# Patient Record
Sex: Female | Born: 1995 | Race: White | Hispanic: No | Marital: Married | Smoking: Former smoker
Health system: Southern US, Community
[De-identification: ages and names within clinical notes are randomized; demographics above are authoritative.]

## PROBLEM LIST (undated history)

## (undated) ENCOUNTER — Inpatient Hospital Stay (HOSPITAL_COMMUNITY): Payer: Self-pay

## (undated) DIAGNOSIS — I10 Essential (primary) hypertension: Secondary | ICD-10-CM

## (undated) DIAGNOSIS — M549 Dorsalgia, unspecified: Secondary | ICD-10-CM

## (undated) DIAGNOSIS — F4312 Post-traumatic stress disorder, chronic: Secondary | ICD-10-CM

## (undated) DIAGNOSIS — Z915 Personal history of self-harm: Secondary | ICD-10-CM

## (undated) DIAGNOSIS — N390 Urinary tract infection, site not specified: Secondary | ICD-10-CM

## (undated) DIAGNOSIS — B9689 Other specified bacterial agents as the cause of diseases classified elsewhere: Secondary | ICD-10-CM

## (undated) DIAGNOSIS — A4902 Methicillin resistant Staphylococcus aureus infection, unspecified site: Secondary | ICD-10-CM

## (undated) DIAGNOSIS — Z973 Presence of spectacles and contact lenses: Secondary | ICD-10-CM

## (undated) DIAGNOSIS — Z8679 Personal history of other diseases of the circulatory system: Secondary | ICD-10-CM

## (undated) DIAGNOSIS — J45909 Unspecified asthma, uncomplicated: Secondary | ICD-10-CM

## (undated) DIAGNOSIS — F419 Anxiety disorder, unspecified: Secondary | ICD-10-CM

## (undated) DIAGNOSIS — IMO0002 Reserved for concepts with insufficient information to code with codable children: Secondary | ICD-10-CM

## (undated) DIAGNOSIS — D649 Anemia, unspecified: Secondary | ICD-10-CM

## (undated) DIAGNOSIS — N760 Acute vaginitis: Secondary | ICD-10-CM

## (undated) DIAGNOSIS — G43909 Migraine, unspecified, not intractable, without status migrainosus: Secondary | ICD-10-CM

## (undated) DIAGNOSIS — F410 Panic disorder [episodic paroxysmal anxiety] without agoraphobia: Secondary | ICD-10-CM

## (undated) DIAGNOSIS — F32A Depression, unspecified: Secondary | ICD-10-CM

## (undated) DIAGNOSIS — F329 Major depressive disorder, single episode, unspecified: Secondary | ICD-10-CM

## (undated) HISTORY — PX: WISDOM TOOTH EXTRACTION: SHX21

## (undated) HISTORY — DX: Essential (primary) hypertension: I10

## (undated) HISTORY — PX: TONSILLECTOMY: SUR1361

---
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
## 2016-01-03 ENCOUNTER — Inpatient Hospital Stay (HOSPITAL_COMMUNITY)
Admission: EM | Admit: 2016-01-03 | Discharge: 2016-01-06 | DRG: 885 | Disposition: A | Discharge status: Home / Self Care | Payer: Medicaid Other | Source: Intra-hospital | Attending: Psychiatry | Admitting: Psychiatry

## 2016-01-03 ENCOUNTER — Emergency Department (HOSPITAL_COMMUNITY)
Admission: EM | Admit: 2016-01-03 | Discharge: 2016-01-03 | Disposition: A | Discharge status: Other Acute Inpatient Hospital | Payer: Medicaid Other | Attending: Emergency Medicine | Admitting: Emergency Medicine

## 2016-01-03 ENCOUNTER — Encounter (HOSPITAL_COMMUNITY): Payer: Self-pay

## 2016-01-03 ENCOUNTER — Emergency Department (HOSPITAL_COMMUNITY): Payer: Medicaid Other

## 2016-01-03 DIAGNOSIS — Y929 Unspecified place or not applicable: Secondary | ICD-10-CM | POA: Insufficient documentation

## 2016-01-03 DIAGNOSIS — G44229 Chronic tension-type headache, not intractable: Secondary | ICD-10-CM | POA: Diagnosis not present

## 2016-01-03 DIAGNOSIS — I10 Essential (primary) hypertension: Secondary | ICD-10-CM | POA: Insufficient documentation

## 2016-01-03 DIAGNOSIS — R52 Pain, unspecified: Secondary | ICD-10-CM

## 2016-01-03 DIAGNOSIS — R45851 Suicidal ideations: Secondary | ICD-10-CM | POA: Diagnosis present

## 2016-01-03 DIAGNOSIS — R519 Headache, unspecified: Secondary | ICD-10-CM | POA: Insufficient documentation

## 2016-01-03 DIAGNOSIS — S1093XA Contusion of unspecified part of neck, initial encounter: Secondary | ICD-10-CM | POA: Insufficient documentation

## 2016-01-03 DIAGNOSIS — F329 Major depressive disorder, single episode, unspecified: Secondary | ICD-10-CM | POA: Diagnosis not present

## 2016-01-03 DIAGNOSIS — Y939 Activity, unspecified: Secondary | ICD-10-CM | POA: Insufficient documentation

## 2016-01-03 DIAGNOSIS — G4700 Insomnia, unspecified: Secondary | ICD-10-CM | POA: Diagnosis present

## 2016-01-03 DIAGNOSIS — M25522 Pain in left elbow: Secondary | ICD-10-CM | POA: Insufficient documentation

## 2016-01-03 DIAGNOSIS — F339 Major depressive disorder, recurrent, unspecified: Secondary | ICD-10-CM | POA: Diagnosis present

## 2016-01-03 DIAGNOSIS — F332 Major depressive disorder, recurrent severe without psychotic features: Secondary | ICD-10-CM | POA: Diagnosis not present

## 2016-01-03 DIAGNOSIS — S161XXA Strain of muscle, fascia and tendon at neck level, initial encounter: Secondary | ICD-10-CM | POA: Diagnosis not present

## 2016-01-03 DIAGNOSIS — F32A Depression, unspecified: Secondary | ICD-10-CM

## 2016-01-03 DIAGNOSIS — M542 Cervicalgia: Secondary | ICD-10-CM | POA: Diagnosis present

## 2016-01-03 DIAGNOSIS — T148 Other injury of unspecified body region: Secondary | ICD-10-CM | POA: Insufficient documentation

## 2016-01-03 DIAGNOSIS — R51 Headache: Secondary | ICD-10-CM | POA: Diagnosis present

## 2016-01-03 DIAGNOSIS — Y999 Unspecified external cause status: Secondary | ICD-10-CM | POA: Diagnosis not present

## 2016-01-03 DIAGNOSIS — T148XXA Other injury of unspecified body region, initial encounter: Secondary | ICD-10-CM

## 2016-01-03 LAB — RAPID URINE DRUG SCREEN, HOSP PERFORMED
Amphetamines: NOT DETECTED
BARBITURATES: NOT DETECTED
Benzodiazepines: NOT DETECTED
Cocaine: NOT DETECTED
OPIATES: NOT DETECTED
TETRAHYDROCANNABINOL: POSITIVE — AB

## 2016-01-03 LAB — COMPREHENSIVE METABOLIC PANEL
ALK PHOS: 55 U/L (ref 38–126)
ALT: 37 U/L (ref 14–54)
ANION GAP: 10 (ref 5–15)
AST: 33 U/L (ref 15–41)
Albumin: 4.3 g/dL (ref 3.5–5.0)
BILIRUBIN TOTAL: 0.5 mg/dL (ref 0.3–1.2)
BUN: 8 mg/dL (ref 6–20)
CALCIUM: 8.9 mg/dL (ref 8.9–10.3)
CO2: 22 mmol/L (ref 22–32)
CREATININE: 0.67 mg/dL (ref 0.44–1.00)
Chloride: 111 mmol/L (ref 101–111)
Glucose, Bld: 94 mg/dL (ref 65–99)
Potassium: 3.5 mmol/L (ref 3.5–5.1)
SODIUM: 143 mmol/L (ref 135–145)
TOTAL PROTEIN: 7.5 g/dL (ref 6.5–8.1)

## 2016-01-03 LAB — CBC
HCT: 41.4 % (ref 36.0–46.0)
HEMOGLOBIN: 13.7 g/dL (ref 12.0–15.0)
MCH: 27.5 pg (ref 26.0–34.0)
MCHC: 33.1 g/dL (ref 30.0–36.0)
MCV: 83 fL (ref 78.0–100.0)
Platelets: 259 10*3/uL (ref 150–400)
RBC: 4.99 MIL/uL (ref 3.87–5.11)
RDW: 13.1 % (ref 11.5–15.5)
WBC: 7.9 10*3/uL (ref 4.0–10.5)

## 2016-01-03 LAB — ACETAMINOPHEN LEVEL: Acetaminophen (Tylenol), Serum: <10 ug/mL — ABNORMAL LOW (ref 10–30)

## 2016-01-03 LAB — ETHANOL: Alcohol, Ethyl (B): 61 mg/dL — ABNORMAL HIGH (ref <5)

## 2016-01-03 LAB — SALICYLATE LEVEL

## 2016-01-03 MED ORDER — ALUM & MAG HYDROXIDE-SIMETH 200-200-20 MG/5ML PO SUSP: 30.0000 mL | ORAL | Status: DC | PRN

## 2016-01-03 MED ORDER — IBUPROFEN 400 MG PO TABS
400.0000 mg | ORAL_TABLET | Freq: Four times a day (QID) | ORAL | Status: DC | PRN
Start: 2016-01-03 — End: 2016-01-06
  Administered 2016-01-03 – 2016-01-06 (×8): 400 mg via ORAL
  Filled 2016-01-03 (×8): qty 1

## 2016-01-03 MED ORDER — HYDROXYZINE HCL 25 MG PO TABS
25.0000 mg | ORAL_TABLET | Freq: Every evening | ORAL | Status: DC | PRN
  Administered 2016-01-05: 25 mg via ORAL
  Filled 2016-01-03: qty 1

## 2016-01-03 MED ORDER — MAGNESIUM HYDROXIDE 400 MG/5ML PO SUSP: 30.0000 mL | Freq: Every day | ORAL | Status: DC | PRN

## 2016-01-03 MED ORDER — TRAZODONE HCL 50 MG PO TABS
50.0000 mg | ORAL_TABLET | Freq: Every evening | ORAL | Status: DC | PRN
  Administered 2016-01-03 – 2016-01-04 (×2): 50 mg via ORAL
  Filled 2016-01-03 (×2): qty 1

## 2016-01-03 MED ORDER — NICOTINE 14 MG/24HR TD PT24
14.0000 mg | MEDICATED_PATCH | Freq: Every day | TRANSDERMAL | Status: DC
  Filled 2016-01-03 (×4): qty 1

## 2016-01-03 MED ORDER — IBUPROFEN 400 MG PO TABS
600.0000 mg | ORAL_TABLET | Freq: Three times a day (TID) | ORAL | Status: DC | PRN
  Administered 2016-01-03: 600 mg via ORAL
  Filled 2016-01-03: qty 2

## 2016-01-03 MED ORDER — ACETAMINOPHEN 325 MG PO TABS: 650.0000 mg | ORAL_TABLET | ORAL | Status: DC | PRN

## 2016-01-03 MED ORDER — LORAZEPAM 1 MG PO TABS: 1.0000 mg | ORAL_TABLET | Freq: Three times a day (TID) | ORAL | Status: DC | PRN

## 2016-01-03 MED ORDER — ACETAMINOPHEN 325 MG PO TABS: 650.0000 mg | ORAL_TABLET | Freq: Four times a day (QID) | ORAL | Status: DC | PRN

## 2016-01-03 MED ORDER — ONDANSETRON HCL 4 MG PO TABS
4.0000 mg | ORAL_TABLET | Freq: Three times a day (TID) | ORAL | Status: DC | PRN
  Administered 2016-01-03: 4 mg via ORAL
  Filled 2016-01-03: qty 1

## 2016-01-03 NOTE — ED Notes (Signed)
Patient transported to Radiology with tech.Patient changed in to paper scrubs in radiology with tech standby. [facility] Clothing placed in locker. Patient wanded by Security.

## 2016-01-03 NOTE — ED Notes (Signed)
Called [facility] for transport to [facility]. 

## 2016-01-03 NOTE — ED Notes (Signed)
Pt informed of the inpatient recommendation. [facility] in w/ pt & explained what would happening if she started acting out or attempted to leave.

## 2016-01-03 NOTE — ED Notes (Signed)
[facility]

## 2016-01-03 NOTE — ED Notes (Signed)
[name] set up in room.  

## 2016-01-03 NOTE — Progress Notes (Signed)
Recreation Therapy Notes  Animal-Assisted Activity (AAA) Program Checklist/Progress Notes Patient Eligibility Criteria Checklist & Daily Group note for Rec Tx Intervention  Date: 03.07.2017 Time: 2:45pm Location: [address] Dayroom   AAA/T Program Assumption of Risk Form signed by Patient/ or Parent Legal Guardian yes  Patient is free of allergies or sever asthma yes  Patient reports no fear of animals yes  Patient reports no history of cruelty to animals yes  Patient understands his/her participation is voluntary yes  Behavioral Response: Did not attend.    Tara Blake Tara Blake, LRT/CTRS        Tara Blake Tara 01/03/2016 3:06 PM 
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
## 2016-01-03 NOTE — ED Notes (Signed)
Pt served w/ IVC paperwork by [facility].
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
## 2016-01-03 NOTE — ED Notes (Signed)
Pattricia BossAnnie, Comptrollersitter, came to this RN stating pt is wanting to see nurse because she wants to leave. Dr. Blinda LeatherwoodPollina informed and IVC paperwork started

## 2016-01-03 NOTE — Progress Notes (Signed)
Adult Psychoeducational Group Note  Date:  01/03/2016 Time:  11:01 PM  Group Topic/Focus:  Wrap-Up Group:   The focus of this group is to help patients review their daily goal of treatment and discuss progress on daily workbooks.  Participation Level:  Active  Participation Quality:  Appropriate  Affect:  Appropriate  Cognitive:  Alert  Insight: Appropriate  Engagement in Group:  Engaged  Modes of Intervention:  Discussion  Additional Comments:  Patient goal for today was to try and better herself while she's here to better her kids. On a scale between 1-10, (1=worst, 10=best) patient rated her day a 6.  Tara Blake 01/03/2016, 11:01 PM

## 2016-01-03 NOTE — ED Notes (Signed)
RN went through belongings with pt, pt confirmed everything is present.
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
## 2016-01-03 NOTE — ED Notes (Signed)
[name] in process 
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
## 2016-01-04 ENCOUNTER — Ambulatory Visit (HOSPITAL_COMMUNITY)
Admission: RE | Admit: 2016-01-04 | Discharge: 2016-01-04 | Disposition: A | Discharge status: Home / Self Care | Payer: Medicaid Other | Source: Ambulatory Visit | Attending: Psychiatry | Admitting: Psychiatry

## 2016-01-04 DIAGNOSIS — R51 Headache: Secondary | ICD-10-CM | POA: Insufficient documentation

## 2016-01-04 DIAGNOSIS — R45851 Suicidal ideations: Secondary | ICD-10-CM

## 2016-01-04 DIAGNOSIS — F339 Major depressive disorder, recurrent, unspecified: Principal | ICD-10-CM

## 2016-01-04 LAB — PREGNANCY, URINE: Preg Test, Ur: NEGATIVE

## 2016-01-04 MED ORDER — CITALOPRAM HYDROBROMIDE 10 MG PO TABS
10.0000 mg | ORAL_TABLET | Freq: Every day | ORAL | Status: DC
  Administered 2016-01-04 – 2016-01-06 (×3): 10 mg via ORAL
  Filled 2016-01-04 (×6): qty 1

## 2016-01-04 NOTE — Progress Notes (Signed)
Patient ID: Tara Blake, female   DOB: [birth year], 20 y.o.   MRN: [id number]  Patient back from CT scan. Patient at this time has not given urine specimen although it was requested and specimen cup was given to patient.

## 2016-01-04 NOTE — Tx Team (Signed)
Interdisciplinary Treatment Plan Update (Adult) Date: 01/04/2016   Date: 01/04/2016 7:43 AM  Progress in Treatment:  Attending groups: Yes  Participating in groups: Yes  Taking medication as prescribed: Yes  Tolerating medication: Yes  Family/Significant othe contact made: No, CSW attempting to make contact with husband Patient understands diagnosis: Yes AEB seeking help with depression Discussing patient identified problems/goals with staff: Yes  Medical problems stabilized or resolved: Yes  Denies suicidal/homicidal ideation: Yes Patient has not harmed self or Others: Yes   New problem(s) identified: None identified at this time.   Discharge Plan or Barriers: Pt will return home at discharge and would like a referral for services to [facility].  Additional comments:  Patient and CSW reviewed pt's identified goals and treatment plan. Patient verbalized understanding and agreed to treatment plan. CSW reviewed [facility] "Discharge Process and Patient Involvement" Form. Pt verbalized understanding of information provided and signed form.   Reason for Continuation of Hospitalization:  Anxiety Depression Medication stabilization Suicidal ideation  Estimated length of stay: 1-2 days  Review of initial/current patient goals per problem list:   1.  Goal(s): Patient will participate in aftercare plan  Met:  Yes  Target date: 3-5 days from date of admission   As evidenced by: Patient will participate within aftercare plan AEB aftercare provider and housing plan at discharge being identified.   01/04/16:  Pt will return home at discharge and would like a referral for services to [facility].  2.  Goal (s): Patient will exhibit decreased depressive symptoms and suicidal ideations.  Met:  Yes  Target date: 3-5 days from date of admission   As evidenced by: Patient will utilize self rating of depression at 3 or below and demonstrate decreased signs of depression or be deemed  stable for discharge by MD.  01/04/16: Pt rates depression at 0/10; denies SI  3.  Goal(s): Patient will demonstrate decreased signs and symptoms of anxiety.  Met:  Yes  Target date: 3-5 days from date of admission   As evidenced by: Patient will utilize self rating of anxiety at 3 or below and demonstrated decreased signs of anxiety, or be deemed stable for discharge by MD  01/04/16: Pt rates anxiety at 2/10.  Attendees:  Patient:    Family:    Physician: Dr. Parke Poisson, MD  01/04/2016 7:43 AM  Nursing: Lars Pinks, RN Case manager  01/04/2016 7:43 AM  Clinical Social Worker Peri Maris, [place] 01/04/2016 7:43 AM  Other: Tilden Fossa, [place] 01/04/2016 7:43 AM  Clinical: Mayra Neer, RN; Gaylan Gerold, RN 01/04/2016 7:43 AM  Other: , RN Charge Nurse 01/04/2016 7:43 AM  Other: Hilda Lias, [place], [place] [facility] [phone]
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
## 2016-01-04 NOTE — Progress Notes (Signed)
Patient ID: Tara Blake, female   DOB: [birth year], 20 y.o.   MRN: [id number]  Patient transferred via [place] and with MHT Dor[facility].

## 2016-01-04 NOTE — Progress Notes (Signed)
Recreation Therapy Notes  Date: 03.08.2017 Time: 9:30am Location: [address] Group Room   Group Topic: Stress Management  Goal Area(s) Addresses:  Patient will actively participate in stress management techniques presented during session.   Behavioral Response: Appropriate, Engaged  Intervention: Stress management techniques  Activity :  Deep Breathing and Progressive Body Relaxation. LRT provided education, instruction and demonstration on practice of Deep Breathing and Progressive Body Relaxation. Patient was asked to participate in technique introduced during session.   Education:  Stress Management, Discharge Planning.   Education Outcome: Acknowledges education  Clinical Observations/Feedback: Patient actively engaged in technique introduced, expressed no concerns and demonstrated ability to practice independently post d/c.   Marykay Lexenise L Nykolas Bacallao, LRT/CTRS  Shadi Larner L 01/04/2016 10:17 AM
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
## 2016-01-04 NOTE — [facility] Suicide Risk Assessment (Signed)
[facility] INPATIENT:  Family/Significant Other Suicide Prevention Education  Suicide Prevention Education:  Education Completed; Tara Blake, Pt's husband [phone], has been identified by the patient as the family member/significant other with whom the patient will be residing, and identified as the person(s) who will aid the patient in the event of a mental health crisis (suicidal ideations/suicide attempt).  With written consent from the patient, the family member/significant other has been provided the following suicide prevention education, prior to the and/or following the discharge of the patient.  The suicide prevention education provided includes the following:  Suicide risk factors  Suicide prevention and interventions  National Suicide Hotline telephone number  [facility] assessment telephone number  [facility] Emergency Assistance 911  [facility] and/or Residential Mobile Crisis [facility] telephone number  Request made of family/significant other to:  Remove weapons (e.g., guns, rifles, knives), all items previously/currently identified as safety concern.    Remove drugs/medications (over-the-counter, prescriptions, illicit drugs), all items previously/currently identified as a safety concern.  The family member/significant other verbalizes understanding of the suicide prevention education information provided.  The family member/significant other agrees to remove the items of safety concern listed above.  Tara Blake, Tara Blake 01/04/2016, 8:41 AM

## 2016-01-04 NOTE — [facility] Group Notes (Addendum)
[facility] LCSW Group Therapy 01/04/2016 1:15 PM  Type of Therapy: Group Therapy- Feelings about Diagnosis  Participation Level: Active   Participation Quality:  Appropriate  Affect:  Appropriate  Cognitive: Alert and Oriented   Insight:  Developing   Engagement in Therapy: Developing/Improving and Engaged   Modes of Intervention: Clarification, Confrontation, Discussion, Education, Exploration, Limit-setting, Orientation, Problem-solving, Rapport Building, Dance movement psychotherapisteality Testing, Socialization and Support  Description of Group:   This group will allow patients to explore their thoughts and feelings about diagnoses they have received. Patients will be guided to explore their level of understanding and acceptance of these diagnoses. Facilitator will encourage patients to process their thoughts and feelings about the reactions of others to their diagnosis, and will guide patients in identifying ways to discuss their diagnosis with significant others in their lives. This group will be process-oriented, with patients participating in exploration of their own experiences as well as giving and receiving support and challenge from other group members.  Summary of Progress/Problems:  Pt participated in group activity and shared letters to her mental health diagnoses and her future. Both letters were goal-oriented and were focused on her motivation to overcome depression and anxiety.   Therapeutic Modalities:   Cognitive Behavioral Therapy Solution Focused Therapy Motivational Interviewing Relapse Prevention Therapy  Chad CordialLauren Carter, LCSWA 01/03/2016 4:03 PM

## 2016-01-04 NOTE — [facility] Group Notes (Signed)
[facility] LCSW Group Therapy 01/04/2016 1:15 PM  Type of Therapy: Group Therapy- Emotion Regulation  Participation Level: Active   Participation Quality:  Appropriate  Affect: Appropriate  Cognitive: Alert and Oriented   Insight:  Developing/Improving  Engagement in Therapy: Developing/Improving and Engaged   Modes of Intervention: Clarification, Confrontation, Discussion, Education, Exploration, Limit-setting, Orientation, Problem-solving, Rapport Building, Dance movement psychotherapisteality Testing, Socialization and Support  Summary of Progress/Problems: The topic for group today was emotional regulation. This group focused on both positive and negative emotion identification and allowed group members to process ways to identify feelings, regulate negative emotions, and find healthy ways to manage internal/external emotions. Group members were asked to reflect on a time when their reaction to an emotion led to a negative outcome and explored how alternative responses using emotion regulation would have benefited them. Group members were also asked to discuss a time when emotion regulation was utilized when a negative emotion was experienced. Pt identified all emotions as necessary and discussed the need to experience pain in order to be able to appreciate joy. She discussed the need to strive in order to accomplish things.    Chad CordialLauren Carter, LCSWA 01/04/2016 3:23 PM

## 2016-01-05 DIAGNOSIS — G44229 Chronic tension-type headache, not intractable: Secondary | ICD-10-CM

## 2016-01-05 DIAGNOSIS — R519 Headache, unspecified: Secondary | ICD-10-CM | POA: Insufficient documentation

## 2016-01-05 DIAGNOSIS — R51 Headache: Secondary | ICD-10-CM

## 2016-01-05 DIAGNOSIS — I10 Essential (primary) hypertension: Secondary | ICD-10-CM | POA: Insufficient documentation

## 2016-01-05 LAB — URINALYSIS, ROUTINE W REFLEX MICROSCOPIC
Bilirubin Urine: NEGATIVE
GLUCOSE, UA: NEGATIVE mg/dL
HGB URINE DIPSTICK: NEGATIVE
KETONES UR: NEGATIVE mg/dL
Nitrite: NEGATIVE
PH: 6.5 (ref 5.0–8.0)
PROTEIN: NEGATIVE mg/dL
Specific Gravity, Urine: 1.022 (ref 1.005–1.030)

## 2016-01-05 LAB — URINE MICROSCOPIC-ADD ON: RBC / HPF: NONE SEEN RBC/hpf (ref 0–5)

## 2016-01-05 LAB — TSH: TSH: 2.321 u[IU]/mL (ref 0.350–4.500)

## 2016-01-05 MED ORDER — TRAZODONE HCL 50 MG PO TABS
25.0000 mg | ORAL_TABLET | Freq: Every evening | ORAL | Status: DC | PRN
Start: 2016-01-05 — End: 2016-01-06
  Administered 2016-01-05: 25 mg via ORAL
  Filled 2016-01-05: qty 1

## 2016-01-05 NOTE — Plan of Care (Signed)
Problem: Alteration in mood Goal: STG-Patient reports thoughts of self-harm to staff Outcome: Progressing She denies thoughts of Self harm

## 2016-01-05 NOTE — Progress Notes (Signed)
Patient ID: Tara Blake, female   DOB: [birth year], 20 y.o.   MRN: [id number] D: Client visible on unit, in dayroom watching TV, interacting with peers, playing cards at times. Client reports of her day "pretty good day" "we been laughing entertaining one another" Client report discharge plans for faith based therapy. Client also plans to begin an exercise regime, of walking. Client reports need to change eating habits "I put salt on everything" A: Writer provided emotional support, encouraged client to follow through with therapy and exercise plans for emotional and physical health. Medications reviewed and administered. Staff will monitor [id number] for safety. R: Client is safe on the unit, attended group.

## 2016-01-05 NOTE — [facility] Group Notes (Signed)
[facility] Group Notes:  (Nursing/MHT/Case Management/Adjunct)  Date:  01/05/2016  Time:  9:51 AM  Type of Therapy:  Psychoeducational Skills  Participation Level:  Active  Participation Quality:  Appropriate  Affect:  Appropriate  Cognitive:  Appropriate  Insight:  Limited  Engagement in Group:  Engaged  Modes of Intervention:  Discussion and Education  Summary of Progress/Problems: Patient attended group and received daily workbook. She reports her relaxation activity is to read.   Marzetta BoardDopson, Aleicia Kenagy E 01/05/2016, 9:51 AM
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
## 2016-01-05 NOTE — [facility] Group Notes (Signed)
[facility] LCSW Group Therapy 01/05/2016 1:15 PM Type of Therapy: Group Therapy Participation Level: Active  Participation Quality: Attentive, Sharing and Supportive  Affect: Appropriate  Cognitive: Alert and Oriented  Insight: Developing/Improving and Engaged  Engagement in Therapy: Developing/Improving and Engaged  Modes of Intervention: Activity, Clarification, Confrontation, Discussion, Education, Exploration, Limit-setting, Orientation, Problem-solving, Rapport Building, Reality Testing, Socialization and Support  Summary of Progress/Problems: Patient was attentive and engaged with speaker from [employer]. Patient was attentive to speaker while they shared their story of dealing with mental health and overcoming it. Patient expressed interest in their programs and services and received information on their agency. Patient processed ways they can relate to the speaker.   Annelisa Ryback, LCSW Clinical Social Worker [facility] [phone]   

## 2016-01-05 NOTE — Progress Notes (Signed)
[facility] Group Notes:  (Nursing/MHT/Case Management/Adjunct)  Date:  01/05/2016  Time:  1:05 AM  Type of Therapy:  Psychoeducational Skills  Participation Level:  Minimal  Participation Quality:  Attentive  Affect:  Flat  Cognitive:  Lacking  Insight:  Limited  Engagement in Group:  Limited  Modes of Intervention:  Education  Summary of Progress/Problems: The patient expressed in group that she had an "all right" day but did not go into further detail. The patient's goal for tomorrow is to feel "less stressed" than today.   Rasheem Figiel S 01/05/2016, 1:05 AM

## 2016-01-05 NOTE — Progress Notes (Signed)
[facility] MD Progress Note  01/05/2016 12:59 PM Aricka Goldberger  MRN:  [id number] Subjective:  Patient  Reports she is feeling better today. She states she is tolerating Celexa well and denies side effects at this time. She does state she felt some AM sedation related to Trazodone and is hoping to reduce dose. Had a good visit from husband yesterday, who brought the children. She is future oriented and hoping to discharge soon. Objective : I have discussed case with treatment team and have met with patient. At this time presents with improved mood and range of affect . Denies any SI. Visible on unit, interacting with selected peers of about her age. No disruptive or agitated behaviors. Of note, patient's BP has tended to be elevated, she denies any known history of HTN, but states she has been told in the past that her " BP runs high". She states there is a family history of HTN. Labs - TSH WNL, Pregnancy test negative   Principal Problem: <principal problem not specified> Diagnosis:   Patient Active Problem List   Diagnosis Date Noted  . Major depressive disorder, recurrent episode ([place]) [F33.9] 01/03/2016   Total Time spent with patient: 20 minutes    Past Medical History: History reviewed. No pertinent past medical history. History reviewed. No pertinent past surgical history. Family History: History reviewed. No pertinent family history.  Social History:  History  Alcohol Use  . Yes    Comment: monthly or less 1-2 drinks     History  Drug Use  . Yes  . Special: Marijuana    Comment: UDS positive THC    Social History   Social History  . Marital Status: Married    Spouse Name: N/A  . Number of Children: N/A  . Years of Education: N/A   Social History Main Topics  . Smoking status: Never Smoker   . Smokeless tobacco: None  . Alcohol Use: Yes     Comment: monthly or less 1-2 drinks  . Drug Use: Yes    Special: Marijuana     Comment: UDS positive THC  . Sexual Activity:  Not Asked   Other Topics Concern  . None   Social History Narrative  . None   Additional Social History:   Sleep: Good  Appetite:  Good  Current Medications: Current Facility-Administered Medications  Medication Dose Route Frequency Provider Last Rate Last Dose  . acetaminophen (TYLENOL) tablet 650 mg  650 mg Oral Q6H PRN Encarnacion Slates, NP      . alum & mag hydroxide-simeth (MAALOX/MYLANTA) 200-200-20 MG/5ML suspension 30 mL  30 mL Oral Q4H PRN Encarnacion Slates, NP      . citalopram (CELEXA) tablet 10 mg  10 mg Oral Daily Jenne Campus, MD   10 mg at 01/05/16 0733  . hydrOXYzine (ATARAX/VISTARIL) tablet 25 mg  25 mg Oral QHS PRN Encarnacion Slates, NP   25 mg at 01/05/16 0650  . ibuprofen (ADVIL,MOTRIN) tablet 400 mg  400 mg Oral Q6H PRN Encarnacion Slates, NP   400 mg at 01/05/16 0734  . magnesium hydroxide (MILK OF MAGNESIA) suspension 30 mL  30 mL Oral Daily PRN Encarnacion Slates, NP      . nicotine (NICODERM CQ - dosed in mg/24 hours) patch 14 mg  14 mg Transdermal Q0600 Encarnacion Slates, NP   14 mg at 01/04/16 0800  . traZODone (DESYREL) tablet 25 mg  25 mg Oral QHS PRN Jenne Campus, MD  Lab Results:  Results for orders placed or performed during the hospital encounter of 01/03/16 (from the past 48 hour(s))  Pregnancy, urine     Status: None   Collection Time: 01/04/16  6:22 PM  Result Value Ref Range   Preg Test, Ur NEGATIVE NEGATIVE    Comment:        THE SENSITIVITY OF THIS METHODOLOGY IS >20 mIU/mL. Performed at [facility]   TSH     Status: None   Collection Time: 01/05/16  6:25 AM  Result Value Ref Range   TSH 2.321 0.350 - 4.500 uIU/mL    Comment: Performed at [facility]    Blood Alcohol level:  Lab Results  Component Value Date   [facility] 61* 01/03/2016    Physical Findings: AIMS: Facial and Oral Movements Muscles of Facial Expression: None, normal Lips and Perioral Area: None, normal Jaw: None, normal Tongue: None,  normal,Extremity Movements Upper (arms, wrists, hands, fingers): None, normal Lower (legs, knees, ankles, toes): None, normal, Trunk Movements Neck, shoulders, hips: None, normal, Overall Severity Severity of abnormal movements (highest score from questions above): None, normal Incapacitation due to abnormal movements: None, normal Patient's awareness of abnormal movements (rate only patient's report): No Awareness, Dental Status Current problems with teeth and/or dentures?: No Does patient usually wear dentures?: No  CIWA:    COWS:     Musculoskeletal: Strength & Muscle Tone: within normal limits Gait & Station: normal Patient leans: N/A  Psychiatric Specialty Exam: ROS no headache, no chest pain, no SOB.   Blood pressure 130/94, pulse 102, temperature 98.2 F (36.8 C), temperature source Oral, resp. rate 18, height '5\' 4"'  (1.626 m), weight 218 lb (98.884 kg), last menstrual period 12/27/2015, SpO2 99 %.Body mass index is 37.4 kg/(m^2).  General Appearance: Well Groomed  Engineer, water::  Good  Speech:  Normal Rate  Volume:  Normal  Mood:  improved, less depressed   Affect:  Appropriate and more reactive   Thought Process:  Linear  Orientation:  Full (Time, Place, and Person)  Thought Content:  denies hallucinations, no delusions   Suicidal Thoughts:  No denies any suicidal ideations , no self injurious ideations   Homicidal Thoughts:  No  Memory:  recent and remote grossly intact   Judgement:  Other:  improved   Insight:  Improved   Psychomotor Activity:  Normal  Concentration:  Good  Recall:  Good  Fund of Knowledge:Good  Language: Good  Akathisia:  Negative  Handed:  Right  AIMS (if indicated):     Assets:  Desire for Improvement Physical Health Resilience  ADL's:  Intact  Cognition: WNL  Sleep:  Number of Hours: 6.25  Assessment - patient reports significant improvement of mood and does present with a fuller, brighter range of affect. No SI. She is tolerating Celexa  well, we have reviewed side effects to include risk of increased suicidal ideations eaerly in treatment in young adults . Trazodone causing some sedation at current dose, so will taper dose down. Hoping for discharge soon.  Treatment Plan Summary: Daily contact with patient to assess and evaluate symptoms and progress in treatment, Medication management, Plan inpatient treatment  and medications as below   Encourage ongoing milieu participation Requested hospitalist consult to address possible HTN Continue Celexa 10 mgrs QDAY for depression Decrease Trazodone to 25 mgrs QHS for insomnia, as needed  Treatment team working on disposition planning options  Neita Garnet, MD 01/05/2016, 12:59 PM
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
## 2016-01-05 NOTE — Progress Notes (Signed)
Patient ID: Tara Blake, female   DOB: 11-10-1995, 20 y.o.   MRN: [id number] D: Client visits with husband during visiting hours, seen hugging, and laughing in room, playful. Client reports reason for admission "tried to hurt my self, put something around my neck" "bad anxiety, they put me on medicine" "feel better got to see my kids today" Client rates anxiety since seeing family "2" of 10. Goal: "calm my stress a little" client noted coping mechanism learned. "deep breathing" "exercise that show how to tense and flex, let it go" Client reports remorse over gesture to hurt self, notes she would not do it again. A: Writer provided emotional support, encouraged her to think of her children and what theirs lives would be without mom. Staff will monitor q6015min for safety. R: Client is safe on the unit, attended group.

## 2016-01-05 NOTE — Progress Notes (Signed)
Patient ID: Tara Blake, female   DOB: [birth year], 20 y.o.   MRN: [id number]  EKG done and placed on physical chart. Patient received a urine specimen cup. Awaiting patient specimen.

## 2016-01-05 NOTE — Progress Notes (Signed)
Adult Psychoeducational Group Note  Date:  01/05/2016 Time:  10:54 PM  Group Topic/Focus:  Wrap-Up Group:   The focus of this group is to help patients review their daily goal of treatment and discuss progress on daily workbooks.  Participation Level:  Active  Participation Quality:  Appropriate  Affect:  Appropriate  Cognitive:  Oriented  Insight: Good  Engagement in Group:  Engaged  Modes of Intervention:  Activity  Additional Comments:  Patient rated her day a 10. Goal was to be less stressful.  Tara MeadKiara M Jameel Blake 01/05/2016, 10:54 PM

## 2016-01-06 ENCOUNTER — Encounter (HOSPITAL_COMMUNITY): Payer: Self-pay | Admitting: Psychiatry

## 2016-01-06 DIAGNOSIS — F332 Major depressive disorder, recurrent severe without psychotic features: Secondary | ICD-10-CM

## 2016-01-06 MED ORDER — HYDROXYZINE HCL 25 MG PO TABS: 25.0000 mg | ORAL_TABLET | Freq: Every evening | ORAL | Status: DC | PRN

## 2016-01-06 MED ORDER — CITALOPRAM HYDROBROMIDE 10 MG PO TABS: 10.0000 mg | ORAL_TABLET | Freq: Every day | ORAL | Status: DC

## 2016-01-06 MED ORDER — TRAZODONE HCL 50 MG PO TABS: 25.0000 mg | ORAL_TABLET | Freq: Every evening | ORAL | Status: DC | PRN

## 2016-01-06 MED ORDER — IBUPROFEN 400 MG PO TABS: 400.0000 mg | ORAL_TABLET | Freq: Four times a day (QID) | ORAL | Status: DC | PRN

## 2016-01-06 NOTE — Tx Team (Signed)
Interdisciplinary Treatment Plan Update (Adult) Date: 01/06/2016   Date: 01/06/2016 12:31 PM  Progress in Treatment:  Attending groups: Yes  Participating in groups: Yes  Taking medication as prescribed: Yes  Tolerating medication: Yes  Family/Significant othe contact made: Yes with husband Patient understands diagnosis: Yes AEB seeking help with depression Discussing patient identified problems/goals with staff: Yes  Medical problems stabilized or resolved: Yes  Denies suicidal/homicidal ideation: Yes Patient has not harmed self or Others: Yes   New problem(s) identified: None identified at this time.   Discharge Plan or Barriers: Pt will return home at discharge and would like a referral for services to [facility].  Additional comments:  Patient and CSW reviewed pt's identified goals and treatment plan. Patient verbalized understanding and agreed to treatment plan. CSW reviewed [facility] "Discharge Process and Patient Involvement" Form. Pt verbalized understanding of information provided and signed form.   Reason for Continuation of Hospitalization:  Anxiety Depression Medication stabilization Suicidal ideation  Estimated length of stay: 0 days  Review of initial/current patient goals per problem list:   1.  Goal(s): Patient will participate in aftercare plan  Met:  Yes  Target date: 3-5 days from date of admission   As evidenced by: Patient will participate within aftercare plan AEB aftercare provider and housing plan at discharge being identified.   01/04/16:  Pt will return home at discharge and would like a referral for services to [facility].  2.  Goal (s): Patient will exhibit decreased depressive symptoms and suicidal ideations.  Met:  Yes  Target date: 3-5 days from date of admission   As evidenced by: Patient will utilize self rating of depression at 3 or below and demonstrate decreased signs of depression or be deemed stable for discharge by  MD.  01/04/16: Pt rates depression at 0/10; denies SI  3.  Goal(s): Patient will demonstrate decreased signs and symptoms of anxiety.  Met:  Yes  Target date: 3-5 days from date of admission   As evidenced by: Patient will utilize self rating of anxiety at 3 or below and demonstrated decreased signs of anxiety, or be deemed stable for discharge by MD  01/04/16: Pt rates anxiety at 2/10.  Attendees:  Patient:    Family:    Physician: Dr. Sabra Heck, MD  01/06/2016 12:31 PM  Nursing: Lars Pinks, RN Case manager  01/06/2016 12:31 PM  Clinical Social Worker Peri Maris, [place] 01/06/2016 12:31 PM  Other: Tilden Fossa, [place] 01/06/2016 12:31 PM  Clinical: Desma Paganini, RN  01/06/2016 12:31 PM  Other: , RN Charge Nurse 01/06/2016 12:31 PM  Other:     Peri Maris, [place] Social Work [phone]

## 2016-01-06 NOTE — [facility] Suicide Risk Assessment (Signed)
[facility]   Principal Problem: <principal problem not specified> Discharge Diagnoses:  Patient Active Problem List   Diagnosis Date Noted  . Headache [R51]   . Essential hypertension [I10]   . Major depressive disorder, recurrent episode ([facility]) [F33.9] 01/03/2016    Total Time spent with patient: 20 minutes  Musculoskeletal: Strength & Muscle Tone: within normal limits Gait & Station: normal Patient leans: normal  Psychiatric Specialty Exam: Review of Systems  Constitutional: Negative.   Eyes: Negative.   Respiratory: Positive for shortness of breath.   Cardiovascular: Negative.   Gastrointestinal: Positive for nausea.  Genitourinary: Negative.   Musculoskeletal: Positive for back pain and neck pain.  Skin: Negative.   Neurological: Positive for headaches.  Endo/Heme/Allergies: Negative.   Psychiatric/Behavioral: Positive for depression. The patient is nervous/anxious.     Blood pressure 141/99, pulse 113, temperature 98.4 F (36.9 C), temperature source Oral, resp. rate 18, height 5\' 4"  (1.626 m), weight 98.884 kg (218 lb), last menstrual period 12/27/2015, SpO2 99 %.Body mass index is 37.4 kg/(m^2).  General Appearance: Fairly Groomed  Patent attorneyye Contact::  Fair  Speech:  Clear and Coherent409  Volume:  Normal  Mood:  Euthymic  Affect:  Appropriate  Thought Process:  Coherent and Goal Directed  Orientation:  Full (Time, Place, and Person)  Thought Content:  plans as she moves on  Suicidal Thoughts:  No  Homicidal Thoughts:  No  Memory:  Immediate;   Fair Recent;   Fair Remote;   Fair  Judgement:  Fair  Insight:  Present  Psychomotor Activity:  Normal  Concentration:  Fair  Recall:  FiservFair  Fund of Knowledge:Fair  Language: Fair  Akathisia:  No  Handed:  Right  AIMS (if indicated):     Assets:  Desire for Improvement Housing Social Support Transportation  Sleep:  Number of Hours: 6.25  Cognition: WNL  ADL's:  Intact  In full contact  with reality. There are no active SI plans or intent. She is willing and motivated to pursue outpatient treatment Mental Status Per Nursing Assessment::   On Admission:     Demographic Factors:  Adolescent or young adult and Caucasian  Loss Factors: none identified  Historical Factors: Family history of mental illness or substance abuse  Risk Reduction Factors:   Responsible for children under 20 years of age, Sense of responsibility to family, Living with another person, especially a relative and Positive social support  Continued Clinical Symptoms:  Depression:   Impulsivity  Cognitive Features That Contribute To Risk:  None    Suicide Risk:  Minimal: No identifiable suicidal ideation.  Patients presenting with no risk factors but with morbid ruminations; may be classified as minimal risk based on the severity of the depressive symptoms  Follow-up Information    Follow up with [facility] On 01/13/2016.   Why:  Appointment for initial evaluation for therapy and medications management w this provider on 3/17 at 9:15 AM.  Please call to cancel or reschedule if necessary. Bring photo ID and insurance card.   Contact information:   [address],  [place], [place] [address] Phone: [phone] Fax:  [phone]      Plan Of Care/Follow-up recommendations:  Activity:  as tolerated Diet:  regular Follow up as above Kingstin Heims A, MD 01/06/2016, 11:04 AM

## 2016-01-06 NOTE — Progress Notes (Signed)
Recreation Therapy Notes   Date: 03.10.2017 Time: 9:30am Location: [address] Group Room   Group Topic: Stress Management  Goal Area(s) Addresses:  Patient will actively participate in stress management techniques presented during session.   Behavioral Response: Did not attend.    Annalis Kaczmarczyk L Kona Yusuf, LRT/CTRS        Corby Villasenor L 01/06/2016 10:24 AM 

## 2016-01-06 NOTE — Plan of Care (Signed)
Problem: Diagnosis: Increased Risk For Suicide Attempt Goal: STG-Patient Will Comply With Medication Regime Outcome: Progressing Client is safe on the unit AEB verbalizing she is not suicidal "I wouldn't do it again" "I hate it happen"
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
## 2016-01-06 NOTE — Progress Notes (Signed)
Pt d/c from the hospital with her husband. All items returned. All D/C instructions given and prescriptions given. Pt denies si and hi.

## 2016-01-06 NOTE — Progress Notes (Signed)
  [facility] :  Will you be returning to the same living situation after discharge:  Yes,  Pt returning home with family At discharge, do you have transportation home?: Yes,  Pt husband to pick up Do you have the ability to pay for your medications: Yes,  Pt provided with prescriptions  Release of information consent forms completed and in the chart;  Patient's signature needed at discharge.  Patient to Follow up at: Follow-up Information    Follow up with [facility] On 01/13/2016.   Why:  Appointment for initial evaluation for therapy and medications management w this provider on 3/17 at 9:15 AM.  Please call to cancel or reschedule if necessary. Bring photo ID and insurance card.   Contact information:   [address],  [place], [place] [address] Phone: [phone] Fax:  [phone]      Next level of care provider has access to [facility] Link:no  Safety Planning and Suicide Prevention discussed: Yes,  with husband; see SPE note for further details  Have you used any form of tobacco in the last 30 days? (Cigarettes, Smokeless Tobacco, Cigars, and/or Pipes): No  Has patient been referred to the Quitline?: N/A patient is not a smoker  Patient has been referred for addiction treatment: N/A  Elaina HoopsCarter, Yigit Norkus M 01/06/2016, 12:32 PM

## 2016-01-06 NOTE — [facility] Suicide Risk Assessment (Deleted)
[facility]   Principal Problem: <principal problem not specified> Discharge Diagnoses:  Patient Active Problem List   Diagnosis Date Noted  . Headache [R51]   . Essential hypertension [I10]   . Major depressive disorder, recurrent episode (HCC) [F33.9] 01/03/2016    Total Time spent with patient: 20 minutes  Musculoskeletal: Strength & Muscle Tone: within normal limits Gait & Station: normal Patient leans: normal  Psychiatric Specialty Exam: Review of Systems  Constitutional: Negative.   HENT: Negative.   Eyes: Negative.   Respiratory: Negative.   Cardiovascular: Negative.   Gastrointestinal: Negative.   Genitourinary: Negative.   Musculoskeletal:       Arm pain  Skin: Negative.   Neurological: Negative.   Endo/Heme/Allergies: Negative.   Psychiatric/Behavioral: Positive for depression.    Blood pressure 141/99, pulse 113, temperature 98.4 F (36.9 C), temperature source Oral, resp. rate 18, height 5\' 4"  (1.626 m), weight 98.884 kg (218 lb), last menstrual period 12/27/2015, SpO2 99 %.Body mass index is 37.4 kg/(m^2).  General Appearance: Fairly Groomed  Patent attorneyye Contact::  Fair  Speech:  Clear and Coherent409  Volume:  Normal  Mood:  Euthymic  Affect:  Appropriate  Thought Process:  Coherent and Goal Directed  Orientation:  Full (Time, Place, and Person)  Thought Content:  plans as she moves on  Suicidal Thoughts:  No  Homicidal Thoughts:  No  Memory:  Immediate;   Fair Recent;   Fair Remote;   Fair  Judgement:  Fair  Insight:  Present  Psychomotor Activity:  Normal  Concentration:  Fair  Recall:  FiservFair  Fund of Knowledge:Fair  Language: Fair  Akathisia:  Negative  Handed:  Right  AIMS (if indicated):     Assets:  Desire for Improvement Housing Physical Health Social Support Talents/Skills Vocational/Educational  Sleep:  Number of Hours: 6.25  Cognition: WNL  ADL's:  Intact  In full contact with reality. There are no active  SI plans or intent. She states she is ready to go home. Has seen benefit from Celexa before. Will be involved in therapy Mental Status Per Nursing Assessment::   On Admission:     Demographic Factors:  Adolescent or young adult and Caucasian  Loss Factors: none identified  Historical Factors: none identified  Risk Reduction Factors:   Sense of responsibility to family, Employed, Living with another person, especially a relative and Positive social support  Continued Clinical Symptoms:  Depression:   Impulsivity  Cognitive Features That Contribute To Risk:  None    Suicide Risk:  Minimal: No identifiable suicidal ideation.  Patients presenting with no risk factors but with morbid ruminations; may be classified as minimal risk based on the severity of the depressive symptoms  Follow-up Information    Follow up with [facility] On 01/13/2016.   Why:  Appointment for initial evaluation for therapy and medications management w this provider on 3/17 at 9:15 AM.  Please call to cancel or reschedule if necessary. Bring photo ID and insurance card.   Contact information:   [address],  [place], [place] [address] Phone: [phone] Fax:  [phone]      Plan Of Care/Follow-up recommendations:  Activity:  as tolerate Diet:  regular  Sung Renton A, MD 01/06/2016, 9:13 AM

## 2016-07-05 ENCOUNTER — Encounter (HOSPITAL_COMMUNITY): Payer: Self-pay | Admitting: Emergency Medicine

## 2016-07-05 ENCOUNTER — Emergency Department (HOSPITAL_COMMUNITY)
Admission: EM | Admit: 2016-07-05 | Discharge: 2016-07-06 | Disposition: A | Discharge status: Home / Self Care | Payer: Medicaid Other | Attending: Emergency Medicine | Admitting: Emergency Medicine

## 2016-07-05 DIAGNOSIS — Z79899 Other long term (current) drug therapy: Secondary | ICD-10-CM | POA: Insufficient documentation

## 2016-07-05 DIAGNOSIS — Z791 Long term (current) use of non-steroidal anti-inflammatories (NSAID): Secondary | ICD-10-CM | POA: Insufficient documentation

## 2016-07-05 DIAGNOSIS — N390 Urinary tract infection, site not specified: Secondary | ICD-10-CM | POA: Insufficient documentation

## 2016-07-05 DIAGNOSIS — R1030 Lower abdominal pain, unspecified: Secondary | ICD-10-CM | POA: Diagnosis present

## 2016-07-05 LAB — URINALYSIS, ROUTINE W REFLEX MICROSCOPIC
BILIRUBIN URINE: NEGATIVE
GLUCOSE, UA: NEGATIVE mg/dL
Ketones, ur: NEGATIVE mg/dL
Nitrite: NEGATIVE
PROTEIN: 30 mg/dL — AB
Specific Gravity, Urine: >1.03 — ABNORMAL HIGH (ref 1.005–1.030)
pH: 6 (ref 5.0–8.0)

## 2016-07-05 LAB — URINE MICROSCOPIC-ADD ON

## 2016-07-05 LAB — PREGNANCY, URINE: PREG TEST UR: NEGATIVE

## 2016-07-05 MED ORDER — PHENAZOPYRIDINE HCL 100 MG PO TABS
100.0000 mg | ORAL_TABLET | Freq: Once | ORAL | Status: AC
  Administered 2016-07-05: 100 mg via ORAL
  Filled 2016-07-05: qty 1

## 2016-07-05 MED ORDER — IBUPROFEN 800 MG PO TABS
800.0000 mg | ORAL_TABLET | Freq: Once | ORAL | Status: AC
  Administered 2016-07-05: 800 mg via ORAL
  Filled 2016-07-05: qty 1

## 2016-07-05 MED ORDER — CEPHALEXIN 500 MG PO CAPS
500.0000 mg | ORAL_CAPSULE | Freq: Once | ORAL | Status: AC
  Administered 2016-07-05: 500 mg via ORAL
  Filled 2016-07-05: qty 1

## 2016-07-05 NOTE — ED Triage Notes (Signed)
Pt c/o dysuria and lower abd pressure x 2 days.

## 2016-07-05 NOTE — ED Provider Notes (Signed)
By signing my name below, I, Tara Blake, attest that this documentation has been prepared under the direction and in the presence of Tara Packard N Max Nuno, DO. Electronically Signed: Phillis HaggisGabriella Blake, ED Scribe. 07/05/16. 11:39 PM.  TIME SEEN: 11:34 PM   CHIEF COMPLAINT:  Chief Complaint  Patient presents with  . Abdominal Pain   HPI:  HPI Comments: Tara Blake is a 20 y.o. female who presents to the Emergency Department complaining of gradually worsening, pressured, lower abdominal pain onset 2 days ago. Pt reports associated nausea, loose stools, hematuria, dysuria, urgency, and frequency. She reports worsening pain with palpation of the abdomen. She reports relief with "pushing" urination. Pt states that she will have to push hard to get the urine out. LMP one month ago. She reports hx of UTI when she was 13. She denies fever, vomiting, vaginal bleeding, or vaginal discharge.   ROS: See HPI Constitutional: no fever  Eyes: no drainage  ENT: no runny nose   Cardiovascular:  no chest pain  Resp: no SOB  GI: no vomiting GU: dysuria Integumentary: no rash  Allergy: no hives  Musculoskeletal: no leg swelling  Neurological: no slurred speech ROS otherwise negative  PAST MEDICAL HISTORY/PAST SURGICAL HISTORY:  History reviewed. No pertinent past medical history.  MEDICATIONS:  Prior to Admission medications   Medication Sig Start Date End Date Taking? Authorizing Provider  citalopram (CELEXA) 10 MG tablet Take 1 tablet (10 mg total) by mouth daily. For depression 01/06/16  Yes Sanjuana KavaAgnes I Nwoko, NP  etonogestrel (NEXPLANON) 68 MG IMPL implant 1 each by Subdermal route once.   Yes Historical Provider, MD  hydrOXYzine (ATARAX/VISTARIL) 25 MG tablet Take 1 tablet (25 mg total) by mouth at bedtime as needed (sleep). 01/06/16  Yes Sanjuana KavaAgnes I Nwoko, NP  ibuprofen (ADVIL,MOTRIN) 400 MG tablet Take 1 tablet (400 mg total) by mouth every 6 (six) hours as needed. For pain 01/06/16  Yes Sanjuana KavaAgnes I Nwoko, NP   Lurasidone HCl (LATUDA PO) Take 1 tablet by mouth every evening.   Yes Historical Provider, MD  traZODone (DESYREL) 50 MG tablet Take 0.5 tablets (25 mg total) by mouth at bedtime as needed for sleep. Patient taking differently: Take 50 mg by mouth at bedtime as needed for sleep.  01/06/16  Yes Sanjuana KavaAgnes I Nwoko, NP    ALLERGIES:  Allergies  Allergen Reactions  . Amoxicillin Rash    SOCIAL HISTORY:  Social History  Substance Use Topics  . Smoking status: Never Smoker  . Smokeless tobacco: Never Used  . Alcohol use Yes     Comment: monthly or less 1-2 drinks    FAMILY HISTORY: History reviewed. No pertinent family history.  EXAM: BP 128/85 (BP Location: Left Arm)   Pulse 79   Temp 98.2 F (36.8 C) (Oral)   Resp 18   Ht 5\' 4"  (1.626 m)   Wt 220 lb (99.8 kg)   LMP 06/04/2016   SpO2 98%   BMI 37.76 kg/m  CONSTITUTIONAL: Alert and oriented and responds appropriately to questions. Well-appearing; well-nourished HEAD: Normocephalic EYES: Conjunctivae clear, PERRL ENT: normal nose; no rhinorrhea; moist mucous membranes NECK: Supple, no meningismus, no LAD  CARD: RRR; S1 and S2 appreciated; no murmurs, no clicks, no rubs, no gallops RESP: Normal chest excursion without splinting or tachypnea; breath sounds clear and equal bilaterally; no wheezes, no rhonchi, no rales, no hypoxia or respiratory distress, speaking full sentences ABD/GI: Normal bowel sounds; non-distended; soft, minimal tenderness in the suprapubic region, no rebound, no guarding, no  peritoneal signs BACK:  The back appears normal and is non-tender to palpation, there is no CVA tenderness EXT: Normal ROM in all joints; non-tender to palpation; no edema; normal capillary refill; no cyanosis, no calf tenderness or swelling    SKIN: Normal color for age and race; warm; no rash NEURO: Moves all extremities equally, sensation to light touch intact diffusely, cranial nerves II through XII intact PSYCH: The patient's mood  and manner are appropriate. Grooming and personal hygiene are appropriate.  MEDICAL DECISION MAKING: Patient here with symptoms of urinary tract infection. Urine confirms infection. Culture is pending. Will discharge on Keflex. Otherwise abdominal exam is benign. No vaginal bleeding or discharge. Pregnancy test is negative. Doubt appendicitis. I feel she is safe to be discharged home. Recommended alternating Tylenol, ibuprofen for pain. Recommended over-the-counter Pyridium. Discussed return precautions. She is comfortable with this plan.  At this time, I do not feel there is any life-threatening condition present. I have reviewed and discussed all results (EKG, imaging, lab, urine as appropriate), exam findings with patient/family. I have reviewed nursing notes and appropriate previous records.  I feel the patient is safe to be discharged home without further emergent workup and can continue workup as an outpatient as needed. Discussed usual and customary return precautions. Patient/family verbalize understanding and are comfortable with this plan.  Outpatient follow-up has been provided. All questions have been answered.  I personally performed the services described in this documentation, which was scribed in my presence. The recorded information has been reviewed and is accurate.    Tara Maw Deno Sida, DO 07/06/16 [phone]

## 2016-07-06 MED ORDER — CEPHALEXIN 500 MG PO CAPS: 500.0000 mg | ORAL_CAPSULE | Freq: Two times a day (BID) | ORAL | 0 refills | Status: DC

## 2016-07-06 MED ORDER — ONDANSETRON 4 MG PO TBDP: 4.0000 mg | ORAL_TABLET | Freq: Three times a day (TID) | ORAL | 0 refills | Status: DC | PRN

## 2016-07-06 NOTE — Discharge Instructions (Signed)
You may alternate between Tylenol 1000 mg every 6 hours as needed for pain and Ibuprofen 800 mg every 8 hours as needed for pain.  To find a primary care or specialty doctor please call [phone] or [phone] to access "[facility] Find a Doctor Service."  You may also go on the [facility] website at [email or web address]  There are also multiple [facility], [facility] and [facility] practices throughout the [place] that are frequently accepting new patients. You may find a clinic that is close to your home and contact them.  [facility] -  [address] [place] [id number] [phone]  [facility] in [place] (also locations in [place] and [place]) -  [address] [place] [address] [phone]  [facility] -  [address] [place] [address] [phone]

## 2016-07-07 LAB — URINE CULTURE

## 2017-11-02 ENCOUNTER — Other Ambulatory Visit: Payer: Self-pay

## 2017-11-02 ENCOUNTER — Emergency Department (HOSPITAL_COMMUNITY)
Admission: EM | Admit: 2017-11-02 | Discharge: 2017-11-02 | Disposition: A | Discharge status: Home / Self Care | Payer: Medicaid Other | Attending: Emergency Medicine | Admitting: Emergency Medicine

## 2017-11-02 ENCOUNTER — Encounter (HOSPITAL_COMMUNITY): Payer: Self-pay | Admitting: *Deleted

## 2017-11-02 DIAGNOSIS — H6690 Otitis media, unspecified, unspecified ear: Secondary | ICD-10-CM

## 2017-11-02 DIAGNOSIS — Z79899 Other long term (current) drug therapy: Secondary | ICD-10-CM | POA: Insufficient documentation

## 2017-11-02 DIAGNOSIS — I10 Essential (primary) hypertension: Secondary | ICD-10-CM | POA: Insufficient documentation

## 2017-11-02 MED ORDER — FLUTICASONE PROPIONATE 50 MCG/ACT NA SUSP: 1.0000 | Freq: Every day | NASAL | 2 refills | Status: DC

## 2017-11-02 MED ORDER — BENZONATATE 100 MG PO CAPS: 100.0000 mg | ORAL_CAPSULE | Freq: Three times a day (TID) | ORAL | 0 refills | Status: DC | PRN

## 2017-11-02 MED ORDER — IBUPROFEN 800 MG PO TABS: 800.0000 mg | ORAL_TABLET | Freq: Three times a day (TID) | ORAL | 0 refills | Status: DC

## 2017-11-02 MED ORDER — CEFDINIR 300 MG PO CAPS: 300.0000 mg | ORAL_CAPSULE | Freq: Two times a day (BID) | ORAL | 0 refills | Status: DC

## 2017-11-02 MED ORDER — IBUPROFEN 800 MG PO TABS
800.0000 mg | ORAL_TABLET | Freq: Once | ORAL | Status: AC
Start: 2017-11-02 — End: 2017-11-02
  Administered 2017-11-02: 800 mg via ORAL
  Filled 2017-11-02: qty 1

## 2017-11-02 NOTE — ED Triage Notes (Signed)
Pt reports left ear pain for several days but woke up with severe pain this am and reports fever/chills. otc ear drops helped dull the pain pta.

## 2017-11-02 NOTE — Discharge Instructions (Signed)
You were seen in the emergency today and diagnosed with an ear infection of your right ear. I have prescribed you multiple medications:   - Cefdinir- this is an antibiotic, take this twice per day for 10 days. Please take all of your antibiotics until finished. You may develop abdominal discomfort or diarrhea from the antibiotic.  You may help offset this with probiotics which you can buy at the store (ask your pharmacist if unable to find) or get probiotics in the form of eating yogurt. Do not eat or take the probiotics until 2 hours after your antibiotic. If you are unable to tolerate these side effects follow-up with your primary care provider or return to the emergency department.   If you begin to experience any blistering, rashes, swelling, or difficulty breathing seek medical care for evaluation of potentially more serious side effects.   Please be aware that this medication may interact with other medications you are taking, please be sure to discuss your medication list with your pharmacist. If you are taking birth control the antibiotic will deactivate your birth control for 2 weeks.    -Flonase to be used 1 spray in each nostril daily.  This medication is used to treat your congestion.  -Tessalon can be taken once every 8 hours as needed.  This medication is used to treat your cough.  -Ibuprofen to be taken once every 8 hours as needed for pain.  You will need to follow-up with your primary care provider in 1 week if your symptoms have not improved.  If you do not have a primary care provider one is provided in your discharge instructions.  Return to the emergency department for any new or worsening symptoms including but not limited to redness or swelling behind your ear, worsening of pain, inability to move your neck, difficulty breathing, chest pain, or passing out.

## 2017-11-02 NOTE — ED Provider Notes (Signed)
[facility]   CSN: [id number] Arrival date & time: 11/02/17  [id number]     History   Chief Complaint Chief Complaint  Patient presents with  . Otalgia    HPI Tara Blake is a 22 y.o. female presents to the ED complaining of L ear pain x 3 days. Patient states she has had intermittent popping sensation with mild discomfort bilaterally for the past 1 month. States that the L ear became painful 3 days ago, progressively worsening, rates pain a 10/10. Worse with yawning. Minimal improvement with OTC cold drops. Has had fevers to 102.1. Reports associated congestion, rhinorrhea, sore throat, and mucous sputum productive cough. Denies dyspnea or chest pain.   HPI  History reviewed. No pertinent past medical history.  Patient Active Problem List   Diagnosis Date Noted  . Headache   . Essential hypertension   . Major depressive disorder, recurrent episode (HCC) 01/03/2016    History reviewed. No pertinent surgical history.  OB History    No data available       Home Medications    Prior to Admission medications   Medication Sig Start Date End Date Taking? Authorizing Provider  cephALEXin (KEFLEX) 500 MG capsule Take 1 capsule (500 mg total) by mouth 2 (two) times daily. 07/06/16   Ward, Layla MawKristen N, DO  citalopram (CELEXA) 10 MG tablet Take 1 tablet (10 mg total) by mouth daily. For depression 01/06/16   Armandina StammerNwoko, Agnes I, NP  etonogestrel (NEXPLANON) 68 MG IMPL implant 1 each by Subdermal route once.    [provider]  hydrOXYzine (ATARAX/VISTARIL) 25 MG tablet Take 1 tablet (25 mg total) by mouth at bedtime as needed (sleep). 01/06/16   Armandina StammerNwoko, Agnes I, NP  ibuprofen (ADVIL,MOTRIN) 400 MG tablet Take 1 tablet (400 mg total) by mouth every 6 (six) hours as needed. For pain 01/06/16   Armandina StammerNwoko, Agnes I, NP  Lurasidone HCl (LATUDA PO) Take 1 tablet by mouth every evening.    [provider]  ondansetron (ZOFRAN ODT) 4 MG  disintegrating tablet Take 1 tablet (4 mg total) by mouth every 8 (eight) hours as needed for nausea or vomiting. 07/06/16   Ward, Layla MawKristen N, DO  traZODone (DESYREL) 50 MG tablet Take 0.5 tablets (25 mg total) by mouth at bedtime as needed for sleep. Patient taking differently: Take 50 mg by mouth at bedtime as needed for sleep.  01/06/16   Sanjuana KavaNwoko, Agnes I, NP    Family History History reviewed. No pertinent family history.  Social History Social History   Tobacco Use  . Smoking status: Never Smoker  . Smokeless tobacco: Never Used  Substance Use Topics  . Alcohol use: Yes    Comment: monthly or less 1-2 drinks  . Drug use: Yes    Types: Marijuana    Comment: UDS positive THC     Allergies   Amoxicillin   Review of Systems Review of Systems  Constitutional: Positive for fever.  HENT: Positive for congestion, ear pain, rhinorrhea and sore throat. Negative for trouble swallowing.   Eyes: Negative for pain, discharge, itching and visual disturbance.  Respiratory: Negative for shortness of breath.   Cardiovascular: Negative for chest pain.     Physical Exam Updated Vital Signs BP (!) 127/105 (BP Location: Right Arm)   Pulse 72   Temp 98.4 F (36.9 C) (Oral)   Resp 20   LMP 10/14/2017   SpO2 99%   Physical Exam  Constitutional: She appears well-developed  and well-nourished.  Non-toxic appearance. No distress.  HENT:  Head: Normocephalic and atraumatic.  Right Ear: No mastoid tenderness. Tympanic membrane is erythematous (very mild). Tympanic membrane is not perforated, not retracted and not bulging.  Left Ear: No mastoid tenderness. Tympanic membrane is erythematous and bulging. Tympanic membrane is not perforated.  Nose: Mucosal edema present.  Mouth/Throat: Uvula is midline and oropharynx is clear and moist. No oropharyngeal exudate or posterior oropharyngeal erythema.  Ears: outer ear appearance is symmetric, ears are not pointed. No overlying mastoid erythema or  swelling.   Eyes: Conjunctivae are normal. Pupils are equal, round, and reactive to light. Right eye exhibits no discharge. Left eye exhibits no discharge.  Neck: Normal range of motion. Neck supple.  Cardiovascular: Normal rate and regular rhythm.  No murmur heard. Pulmonary/Chest: Breath sounds normal. No respiratory distress. She has no wheezes. She has no rales.  Lymphadenopathy:    She has no cervical adenopathy.  Neurological: She is alert.  Skin: Skin is warm and dry. No rash noted.  Psychiatric: She has a normal mood and affect. Her behavior is normal.  Nursing note and vitals reviewed.   ED Treatments / Results  Labs (all labs ordered are listed, but only abnormal results are displayed) Labs Reviewed - No data to display  EKG  EKG Interpretation None      Radiology No results found.  Procedures Procedures (including critical care time)  Medications Ordered in ED Medications  ibuprofen (ADVIL,MOTRIN) tablet 800 mg (not administered)     Initial Impression / Assessment and Plan / ED Course  I have reviewed the triage vital signs and the nursing notes.  Pertinent labs & imaging results that were available during my care of the patient were reviewed by me and considered in my medical decision making (see chart for details).    Patient presents with otalgia and exam consistent with acute otitis media. Patient is nontoxic appearing and afebrile in the emergency department. No concern for acute mastoiditis or meningitis on exam.  No antibiotic use in the last month.  Patient with allergy to amoxicillin- hives reaction, states she has tolerated Cefdinir well in the past. Will discharge home on Cefdinir as well as with supportive care as below. I discussed treatment plan, need for PCP follow-up, and return precautions with the patient. Provided opportunity for questions, patient confirmed understanding and is in agreement with plan.   Final Clinical Impressions(s) / ED  Diagnoses   Final diagnoses:  Acute otitis media, unspecified otitis media type    ED Discharge Orders        Ordered    fluticasone (FLONASE) 50 MCG/ACT nasal spray  Daily     11/02/17 0948    benzonatate (TESSALON) 100 MG capsule  3 times daily PRN     11/02/17 0948    ibuprofen (ADVIL,MOTRIN) 800 MG tablet  3 times daily     11/02/17 0948    cefdinir (OMNICEF) 300 MG capsule  2 times daily     11/02/17 0948       Cherly Anderson, PA-C 11/02/17 1138    Arby Barrette, MD 11/03/17 1140

## 2018-01-09 ENCOUNTER — Emergency Department
Admission: EM | Admit: 2018-01-09 | Discharge: 2018-01-09 | Disposition: A | Discharge status: Home / Self Care | Payer: Self-pay | Attending: Emergency Medicine | Admitting: Emergency Medicine

## 2018-01-09 ENCOUNTER — Emergency Department: Payer: Self-pay

## 2018-01-09 ENCOUNTER — Encounter: Payer: Self-pay | Admitting: Emergency Medicine

## 2018-01-09 ENCOUNTER — Other Ambulatory Visit: Payer: Self-pay

## 2018-01-09 DIAGNOSIS — R51 Headache: Secondary | ICD-10-CM | POA: Insufficient documentation

## 2018-01-09 DIAGNOSIS — Z79899 Other long term (current) drug therapy: Secondary | ICD-10-CM | POA: Insufficient documentation

## 2018-01-09 DIAGNOSIS — R519 Headache, unspecified: Secondary | ICD-10-CM

## 2018-01-09 DIAGNOSIS — I10 Essential (primary) hypertension: Secondary | ICD-10-CM | POA: Insufficient documentation

## 2018-01-09 DIAGNOSIS — F17210 Nicotine dependence, cigarettes, uncomplicated: Secondary | ICD-10-CM | POA: Insufficient documentation

## 2018-01-09 DIAGNOSIS — J45909 Unspecified asthma, uncomplicated: Secondary | ICD-10-CM | POA: Insufficient documentation

## 2018-01-09 DIAGNOSIS — N390 Urinary tract infection, site not specified: Secondary | ICD-10-CM | POA: Insufficient documentation

## 2018-01-09 DIAGNOSIS — R55 Syncope and collapse: Secondary | ICD-10-CM | POA: Insufficient documentation

## 2018-01-09 HISTORY — DX: Essential (primary) hypertension: I10

## 2018-01-09 HISTORY — DX: Anemia, unspecified: D64.9

## 2018-01-09 HISTORY — DX: Migraine, unspecified, not intractable, without status migrainosus: G43.909

## 2018-01-09 HISTORY — DX: Unspecified asthma, uncomplicated: J45.909

## 2018-01-09 LAB — CBC
HCT: 41.3 % (ref 35.0–47.0)
Hemoglobin: 14.2 g/dL (ref 12.0–16.0)
MCH: 29.2 pg (ref 26.0–34.0)
MCHC: 34.4 g/dL (ref 32.0–36.0)
MCV: 84.8 fL (ref 80.0–100.0)
PLATELETS: 233 10*3/uL (ref 150–440)
RBC: 4.87 MIL/uL (ref 3.80–5.20)
RDW: 12.3 % (ref 11.5–14.5)
WBC: 7.7 10*3/uL (ref 3.6–11.0)

## 2018-01-09 LAB — URINALYSIS, COMPLETE (UACMP) WITH MICROSCOPIC
Bilirubin Urine: NEGATIVE
Glucose, UA: NEGATIVE mg/dL
Hgb urine dipstick: NEGATIVE
Ketones, ur: NEGATIVE mg/dL
Nitrite: NEGATIVE
PH: 7 (ref 5.0–8.0)
PROTEIN: 30 mg/dL — AB
Specific Gravity, Urine: 1.027 (ref 1.005–1.030)

## 2018-01-09 LAB — BASIC METABOLIC PANEL
Anion gap: 9 (ref 5–15)
BUN: 16 mg/dL (ref 6–20)
CHLORIDE: 107 mmol/L (ref 101–111)
CO2: 23 mmol/L (ref 22–32)
CREATININE: 0.82 mg/dL (ref 0.44–1.00)
Calcium: 9 mg/dL (ref 8.9–10.3)
GFR calc non Af Amer: >60 mL/min (ref >60)
Glucose, Bld: 87 mg/dL (ref 65–99)
Potassium: 3.6 mmol/L (ref 3.5–5.1)
Sodium: 139 mmol/L (ref 135–145)

## 2018-01-09 LAB — TROPONIN I

## 2018-01-09 LAB — POCT PREGNANCY, URINE: PREG TEST UR: NEGATIVE

## 2018-01-09 MED ORDER — SODIUM CHLORIDE 0.9 % IV BOLUS (SEPSIS)
1000.0000 mL | Freq: Once | INTRAVENOUS | Status: AC
Start: 2018-01-09 — End: 2018-01-09
  Administered 2018-01-09: 1000 mL via INTRAVENOUS

## 2018-01-09 MED ORDER — DIPHENHYDRAMINE HCL 50 MG/ML IJ SOLN
25.0000 mg | Freq: Once | INTRAMUSCULAR | Status: AC
  Administered 2018-01-09: 25 mg via INTRAVENOUS
  Filled 2018-01-09: qty 1

## 2018-01-09 MED ORDER — NITROFURANTOIN MONOHYD MACRO 100 MG PO CAPS: 100.0000 mg | ORAL_CAPSULE | Freq: Two times a day (BID) | ORAL | 0 refills | Status: AC

## 2018-01-09 MED ORDER — NITROFURANTOIN MONOHYD MACRO 100 MG PO CAPS
100.0000 mg | ORAL_CAPSULE | Freq: Once | ORAL | Status: AC
  Administered 2018-01-09: 100 mg via ORAL
  Filled 2018-01-09 (×2): qty 1

## 2018-01-09 MED ORDER — PROCHLORPERAZINE EDISYLATE 5 MG/ML IJ SOLN
10.0000 mg | Freq: Once | INTRAMUSCULAR | Status: AC
  Administered 2018-01-09: 10 mg via INTRAVENOUS
  Filled 2018-01-09: qty 2

## 2018-01-09 NOTE — ED Provider Notes (Signed)
[facility] Emergency Department Provider Note  ___________________________________________   First MD Initiated Contact with Patient 01/09/18 1734     (approximate)  I have reviewed the triage vital signs and the nursing notes.   HISTORY  Chief Complaint Dizziness  HPI Tara Blake is a 22 y.o. female with a history of hypertension as well as migraine headaches was presenting to the emergency department today after near syncopal episode.  She says that she was driving to work in her car when she began to see flashing lights and then felt her heart racing and felt like she is going to pass out.  She is not reporting any chest pain.  However, she did state that she had numbness to her bilateral hands.  Said the episode lasted about 20 minutes and at the end of it she was left with a tightness sensation across her forehead as well as photosensitivity and nausea.  She said that in the hospital just before entered the room her toes also went numb for several minutes but now she is fully sensate.  Says that she has had migraines in the past but they have not presented with this constellation of symptoms.  Said that she also took ibuprofen prior to coming to work for pain in the back of her head which she says is frequent and worsened with movement.  Past Medical History:  Diagnosis Date  . Anemia   . Asthma   . Hypertension   . Migraine     Patient Active Problem List   Diagnosis Date Noted  . Headache   . Essential hypertension   . Major depressive disorder, recurrent episode (HCC) 01/03/2016    Past Surgical History:  Procedure Laterality Date  . TONSILLECTOMY      Prior to Admission medications   Medication Sig Start Date End Date Taking? Authorizing Provider  benzonatate (TESSALON) 100 MG capsule Take 1 capsule (100 mg total) by mouth 3 (three) times daily as needed for cough. 11/02/17   Petrucelli, Samantha R, PA-C  cefdinir (OMNICEF) 300 MG capsule  Take 1 capsule (300 mg total) by mouth 2 (two) times daily. 11/02/17   Petrucelli, Samantha R, PA-C  cephALEXin (KEFLEX) 500 MG capsule Take 1 capsule (500 mg total) by mouth 2 (two) times daily. 07/06/16   Ward, Layla Maw, DO  citalopram (CELEXA) 10 MG tablet Take 1 tablet (10 mg total) by mouth daily. For depression 01/06/16   Armandina Stammer I, NP  etonogestrel (NEXPLANON) 68 MG IMPL implant 1 each by Subdermal route once.    [provider]  fluticasone (FLONASE) 50 MCG/ACT nasal spray Place 1 spray into both nostrils daily. 11/02/17   Petrucelli, Samantha R, PA-C  hydrOXYzine (ATARAX/VISTARIL) 25 MG tablet Take 1 tablet (25 mg total) by mouth at bedtime as needed (sleep). 01/06/16   Armandina Stammer I, NP  ibuprofen (ADVIL,MOTRIN) 800 MG tablet Take 1 tablet (800 mg total) by mouth 3 (three) times daily. 11/02/17   Petrucelli, Samantha R, PA-C  Lurasidone HCl (LATUDA PO) Take 1 tablet by mouth every evening.    [provider]  ondansetron (ZOFRAN ODT) 4 MG disintegrating tablet Take 1 tablet (4 mg total) by mouth every 8 (eight) hours as needed for nausea or vomiting. 07/06/16   Ward, Layla Maw, DO  traZODone (DESYREL) 50 MG tablet Take 0.5 tablets (25 mg total) by mouth at bedtime as needed for sleep. Patient taking differently: Take 50 mg by mouth at bedtime as needed for  sleep.  01/06/16   Armandina StammerNwoko, Agnes I, NP    Allergies Amoxicillin  History reviewed. No pertinent family history.  Social History Social History   Tobacco Use  . Smoking status: Current Every Day Smoker    Types: E-cigarettes  . Smokeless tobacco: Never Used  Substance Use Topics  . Alcohol use: Yes    Comment: monthly or less 1-2 drinks  . Drug use: Yes    Types: Marijuana    Comment: UDS positive THC    Review of Systems  Constitutional: No fever/chills Eyes: As above ENT: No sore throat. Cardiovascular: Denies chest pain. Respiratory: Denies shortness of breath. Gastrointestinal: No abdominal pain.  No  nausea, no vomiting.  No diarrhea.  No constipation. Genitourinary: Negative for dysuria. Musculoskeletal: Negative for back pain. Skin: Negative for rash. Neurological: Negative for focal weakness    ____________________________________________   PHYSICAL EXAM:  VITAL SIGNS: ED Triage Vitals  Enc Vitals Group     BP 01/09/18 1657 (!) 128/91     Pulse Rate 01/09/18 1657 81     Resp 01/09/18 1657 18     Temp 01/09/18 1657 97.8 F (36.6 C)     Temp Source 01/09/18 1657 Oral     SpO2 01/09/18 1657 96 %     Weight 01/09/18 1656 220 lb (99.8 kg)     Height 01/09/18 1656 5\' 4"  (1.626 m)     Head Circumference --      Peak Flow --      Pain Score 01/09/18 1659 9     Pain Loc --      Pain Edu? --      Excl. in GC? --     Constitutional: Alert and oriented. Well appearing and in no acute distress. Eyes: Conjunctivae are normal.  Head: Atraumatic.  No tenderness to palpation to the occiput. Nose: No congestion/rhinnorhea. Mouth/Throat: Mucous membranes are moist.  Neck: No stridor.  No tenderness to palpation or meningismus to the cervical spine. Cardiovascular: Normal rate, regular rhythm. Grossly normal heart sounds.  Respiratory: Normal respiratory effort.  No retractions. Lungs CTAB. Gastrointestinal: Soft and nontender. No distention. No CVA tenderness. Musculoskeletal: No lower extremity tenderness nor edema.  No joint effusions. Neurologic:  Normal speech and language. No gross focal neurologic deficits are appreciated. Skin:  Skin is warm, dry and intact. No rash noted. Psychiatric: Mood and affect are normal. Speech and behavior are normal.  ____________________________________________   LABS (all labs ordered are listed, but only abnormal results are displayed)  Labs Reviewed  URINALYSIS, COMPLETE (UACMP) WITH MICROSCOPIC - Abnormal; Notable for the following components:      Result Value   Color, Urine YELLOW (*)    APPearance CLOUDY (*)    Protein, ur 30 (*)     Leukocytes, UA LARGE (*)    Bacteria, UA FEW (*)    Squamous Epithelial / LPF 6-30 (*)    All other components within normal limits  BASIC METABOLIC PANEL  CBC  TROPONIN I  POC URINE PREG, ED  POCT PREGNANCY, URINE  CBG MONITORING, ED   ____________________________________________  EKG   ____________________________________________  RADIOLOGY  No acute finding on the CT scan of the brain. ____________________________________________   PROCEDURES  Procedure(s) performed:   Procedures  Critical Care performed:   ____________________________________________   INITIAL IMPRESSION / ASSESSMENT AND PLAN / ED COURSE  Pertinent labs & imaging results that were available during my care of the patient were reviewed by me and considered in my  medical decision making (see chart for details).  Differential diagnosis includes, but is not limited to, intracranial hemorrhage, meningitis/encephalitis, previous head trauma, cavernous venous thrombosis, tension headache, temporal arteritis, migraine or migraine equivalent, idiopathic intracranial hypertension, and non-specific headache. Near syncope, palpitations, electrolyte abnormality, MI, ACS, CVA As part of my medical decision making, I reviewed the following data within the electronic MEDICAL RECORD NUMBER Notes from prior ED visits  ----------------------------------------- 7:58 PM on 01/09/2018 -----------------------------------------  Patient denies any dizziness, tingling or numbness at this time.  Says the headache is greatly improved as well with medicine.  Reassuring CT scan.  Urine concerning for infection.  We will treat the UTI.  Patient to be discharged at this time.  Possibly symptoms related to UTI and patient could have had a near syncopal episode as a result of her infection.  Patient be started on Macrobid.  Will be discharged at this time. ____________________________________________   FINAL CLINICAL  IMPRESSION(S) / ED DIAGNOSES  Near syncope.  UTI.  Headache.      NEW MEDICATIONS STARTED DURING THIS VISIT:  New Prescriptions   No medications on file     Note:  This document was prepared using Dragon voice recognition software and may include unintentional dictation errors.     Myrna Blazer, MD 01/09/18 [phone]

## 2018-01-09 NOTE — ED Notes (Signed)
Reviewed discharge instructions, follow-up care, and prescriptions with patient. Patient verbalized understanding of all information reviewed. Patient stable, with no distress noted at this time.    

## 2018-01-09 NOTE — ED Triage Notes (Addendum)
Pt presents to ED via POV c/o dizziness, headache, and intermittent feeling of tingling in fingertips. Hx migraine but pt states this feels different. +light sensitivity

## 2018-07-10 DIAGNOSIS — Z3200 Encounter for pregnancy test, result unknown: Secondary | ICD-10-CM | POA: Diagnosis not present

## 2018-07-10 DIAGNOSIS — Z3009 Encounter for other general counseling and advice on contraception: Secondary | ICD-10-CM | POA: Diagnosis not present

## 2018-08-25 DIAGNOSIS — Z0389 Encounter for observation for other suspected diseases and conditions ruled out: Secondary | ICD-10-CM | POA: Diagnosis not present

## 2018-08-25 DIAGNOSIS — I10 Essential (primary) hypertension: Secondary | ICD-10-CM | POA: Diagnosis not present

## 2018-08-25 DIAGNOSIS — Z915 Personal history of self-harm: Secondary | ICD-10-CM | POA: Diagnosis not present

## 2018-08-25 DIAGNOSIS — Z8659 Personal history of other mental and behavioral disorders: Secondary | ICD-10-CM | POA: Diagnosis not present

## 2018-08-25 DIAGNOSIS — K644 Residual hemorrhoidal skin tags: Secondary | ICD-10-CM | POA: Diagnosis not present

## 2018-08-25 DIAGNOSIS — Z8759 Personal history of other complications of pregnancy, childbirth and the puerperium: Secondary | ICD-10-CM | POA: Diagnosis not present

## 2018-08-25 DIAGNOSIS — Z1388 Encounter for screening for disorder due to exposure to contaminants: Secondary | ICD-10-CM | POA: Diagnosis not present

## 2018-08-25 DIAGNOSIS — Z23 Encounter for immunization: Secondary | ICD-10-CM | POA: Diagnosis not present

## 2018-08-25 DIAGNOSIS — Z91410 Personal history of adult physical and sexual abuse: Secondary | ICD-10-CM | POA: Diagnosis not present

## 2018-08-25 DIAGNOSIS — Z3009 Encounter for other general counseling and advice on contraception: Secondary | ICD-10-CM | POA: Diagnosis not present

## 2018-08-25 DIAGNOSIS — J4520 Mild intermittent asthma, uncomplicated: Secondary | ICD-10-CM | POA: Diagnosis not present

## 2018-08-25 DIAGNOSIS — Z9889 Other specified postprocedural states: Secondary | ICD-10-CM | POA: Diagnosis not present

## 2018-08-25 DIAGNOSIS — Z3481 Encounter for supervision of other normal pregnancy, first trimester: Secondary | ICD-10-CM | POA: Diagnosis not present

## 2018-08-25 DIAGNOSIS — E669 Obesity, unspecified: Secondary | ICD-10-CM | POA: Diagnosis not present

## 2018-08-25 DIAGNOSIS — Z87891 Personal history of nicotine dependence: Secondary | ICD-10-CM | POA: Diagnosis not present

## 2018-08-26 ENCOUNTER — Other Ambulatory Visit (HOSPITAL_COMMUNITY): Payer: Self-pay | Admitting: Family

## 2018-08-26 DIAGNOSIS — Z3A13 13 weeks gestation of pregnancy: Secondary | ICD-10-CM

## 2018-08-26 DIAGNOSIS — Z3682 Encounter for antenatal screening for nuchal translucency: Secondary | ICD-10-CM

## 2018-09-02 LAB — OB RESULTS CONSOLE ABO/RH: RH Type: NEGATIVE

## 2018-09-04 ENCOUNTER — Ambulatory Visit (INDEPENDENT_AMBULATORY_CARE_PROVIDER_SITE_OTHER): Payer: Medicaid Other | Admitting: Advanced Practice Midwife

## 2018-09-04 ENCOUNTER — Encounter: Payer: Self-pay | Admitting: Advanced Practice Midwife

## 2018-09-04 DIAGNOSIS — T7491XA Unspecified adult maltreatment, confirmed, initial encounter: Secondary | ICD-10-CM | POA: Insufficient documentation

## 2018-09-04 DIAGNOSIS — Z3481 Encounter for supervision of other normal pregnancy, first trimester: Secondary | ICD-10-CM | POA: Diagnosis not present

## 2018-09-04 DIAGNOSIS — Z8659 Personal history of other mental and behavioral disorders: Secondary | ICD-10-CM | POA: Insufficient documentation

## 2018-09-04 DIAGNOSIS — O10919 Unspecified pre-existing hypertension complicating pregnancy, unspecified trimester: Secondary | ICD-10-CM | POA: Insufficient documentation

## 2018-09-04 DIAGNOSIS — O0990 Supervision of high risk pregnancy, unspecified, unspecified trimester: Secondary | ICD-10-CM | POA: Insufficient documentation

## 2018-09-04 DIAGNOSIS — O10911 Unspecified pre-existing hypertension complicating pregnancy, first trimester: Secondary | ICD-10-CM

## 2018-09-04 DIAGNOSIS — O99210 Obesity complicating pregnancy, unspecified trimester: Secondary | ICD-10-CM | POA: Insufficient documentation

## 2018-09-04 DIAGNOSIS — Z87891 Personal history of nicotine dependence: Secondary | ICD-10-CM | POA: Insufficient documentation

## 2018-09-04 DIAGNOSIS — O0991 Supervision of high risk pregnancy, unspecified, first trimester: Secondary | ICD-10-CM

## 2018-09-04 DIAGNOSIS — O99211 Obesity complicating pregnancy, first trimester: Secondary | ICD-10-CM

## 2018-09-04 HISTORY — DX: Personal history of nicotine dependence: Z87.891

## 2018-09-04 HISTORY — DX: Unspecified adult maltreatment, confirmed, initial encounter: T74.91XA

## 2018-09-04 MED ORDER — DOXYLAMINE-PYRIDOXINE ER 20-20 MG PO TBCR: 1.0000 | EXTENDED_RELEASE_TABLET | Freq: Three times a day (TID) | ORAL | 2 refills | Status: DC | PRN

## 2018-09-04 MED ORDER — ASPIRIN EC 81 MG PO TBEC: 81.0000 mg | DELAYED_RELEASE_TABLET | Freq: Every day | ORAL | 2 refills | Status: DC

## 2018-09-04 NOTE — Patient Instructions (Signed)
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
## 2018-09-04 NOTE — Progress Notes (Signed)
Pt is here for initial OB visit.  

## 2018-09-05 LAB — HEMOGLOBIN A1C
Est. average glucose Bld gHb Est-mCnc: 88 mg/dL
HEMOGLOBIN A1C: 4.7 % — AB (ref 4.8–5.6)

## 2018-09-08 ENCOUNTER — Encounter (HOSPITAL_COMMUNITY): Payer: Self-pay

## 2018-09-11 ENCOUNTER — Encounter: Payer: Self-pay | Admitting: Obstetrics and Gynecology

## 2018-09-15 ENCOUNTER — Encounter (HOSPITAL_COMMUNITY): Payer: Self-pay

## 2018-09-16 ENCOUNTER — Encounter (HOSPITAL_COMMUNITY): Payer: Self-pay

## 2018-09-16 ENCOUNTER — Ambulatory Visit (HOSPITAL_COMMUNITY)
Admission: RE | Admit: 2018-09-16 | Discharge: 2018-09-16 | Disposition: A | Discharge status: Home / Self Care | Payer: Medicaid Other | Source: Ambulatory Visit | Attending: Family | Admitting: Family

## 2018-09-16 ENCOUNTER — Other Ambulatory Visit (HOSPITAL_COMMUNITY): Payer: Self-pay | Admitting: *Deleted

## 2018-09-16 ENCOUNTER — Other Ambulatory Visit (HOSPITAL_COMMUNITY): Payer: Self-pay | Admitting: Family

## 2018-09-16 DIAGNOSIS — O09891 Supervision of other high risk pregnancies, first trimester: Secondary | ICD-10-CM | POA: Insufficient documentation

## 2018-09-16 DIAGNOSIS — O10011 Pre-existing essential hypertension complicating pregnancy, first trimester: Secondary | ICD-10-CM | POA: Insufficient documentation

## 2018-09-16 DIAGNOSIS — Z3A13 13 weeks gestation of pregnancy: Secondary | ICD-10-CM | POA: Insufficient documentation

## 2018-09-16 DIAGNOSIS — O10919 Unspecified pre-existing hypertension complicating pregnancy, unspecified trimester: Secondary | ICD-10-CM

## 2018-09-16 DIAGNOSIS — Z3682 Encounter for antenatal screening for nuchal translucency: Secondary | ICD-10-CM

## 2018-09-16 DIAGNOSIS — O99211 Obesity complicating pregnancy, first trimester: Secondary | ICD-10-CM | POA: Diagnosis not present

## 2018-09-16 DIAGNOSIS — O09291 Supervision of pregnancy with other poor reproductive or obstetric history, first trimester: Secondary | ICD-10-CM

## 2018-09-16 HISTORY — DX: Personal history of self-harm: Z91.5

## 2018-09-16 HISTORY — DX: Depression, unspecified: F32.A

## 2018-09-16 HISTORY — DX: Reserved for concepts with insufficient information to code with codable children: IMO0002

## 2018-09-16 HISTORY — DX: Major depressive disorder, single episode, unspecified: F32.9

## 2018-09-16 HISTORY — DX: Urinary tract infection, site not specified: N39.0

## 2018-10-02 ENCOUNTER — Ambulatory Visit (INDEPENDENT_AMBULATORY_CARE_PROVIDER_SITE_OTHER): Payer: Medicaid Other | Admitting: Certified Nurse Midwife

## 2018-10-02 ENCOUNTER — Encounter: Payer: Self-pay | Admitting: Certified Nurse Midwife

## 2018-10-02 VITALS — BP 126/83 | HR 89 | Wt 230.2 lb

## 2018-10-02 DIAGNOSIS — O10912 Unspecified pre-existing hypertension complicating pregnancy, second trimester: Secondary | ICD-10-CM

## 2018-10-02 DIAGNOSIS — O10919 Unspecified pre-existing hypertension complicating pregnancy, unspecified trimester: Secondary | ICD-10-CM

## 2018-10-02 DIAGNOSIS — O09299 Supervision of pregnancy with other poor reproductive or obstetric history, unspecified trimester: Secondary | ICD-10-CM | POA: Insufficient documentation

## 2018-10-02 DIAGNOSIS — O26899 Other specified pregnancy related conditions, unspecified trimester: Secondary | ICD-10-CM

## 2018-10-02 DIAGNOSIS — O26892 Other specified pregnancy related conditions, second trimester: Secondary | ICD-10-CM

## 2018-10-02 DIAGNOSIS — Z6791 Unspecified blood type, Rh negative: Secondary | ICD-10-CM | POA: Insufficient documentation

## 2018-10-02 DIAGNOSIS — O09292 Supervision of pregnancy with other poor reproductive or obstetric history, second trimester: Secondary | ICD-10-CM

## 2018-10-02 DIAGNOSIS — O0990 Supervision of high risk pregnancy, unspecified, unspecified trimester: Secondary | ICD-10-CM

## 2018-10-02 DIAGNOSIS — Z3A15 15 weeks gestation of pregnancy: Secondary | ICD-10-CM

## 2018-10-02 NOTE — Progress Notes (Signed)
Pt presents for ROB. Pt has no concerns today. 
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
## 2018-10-07 LAB — AFP, SERUM, OPEN SPINA BIFIDA
AFP Value: 24.8 ng/mL
Maternal Age At EDD: 22.4 yr

## 2018-10-20 ENCOUNTER — Encounter (HOSPITAL_COMMUNITY): Payer: Self-pay | Admitting: *Deleted

## 2018-10-20 ENCOUNTER — Inpatient Hospital Stay (HOSPITAL_COMMUNITY)
Admission: AD | Admit: 2018-10-20 | Discharge: 2018-10-20 | Disposition: A | Discharge status: Home / Self Care | Payer: Medicaid Other | Source: Ambulatory Visit | Attending: Obstetrics & Gynecology | Admitting: Obstetrics & Gynecology

## 2018-10-20 ENCOUNTER — Telehealth: Payer: Self-pay

## 2018-10-20 ENCOUNTER — Other Ambulatory Visit: Payer: Self-pay

## 2018-10-20 DIAGNOSIS — Z7982 Long term (current) use of aspirin: Secondary | ICD-10-CM | POA: Insufficient documentation

## 2018-10-20 DIAGNOSIS — I10 Essential (primary) hypertension: Secondary | ICD-10-CM | POA: Diagnosis present

## 2018-10-20 DIAGNOSIS — O99342 Other mental disorders complicating pregnancy, second trimester: Secondary | ICD-10-CM | POA: Insufficient documentation

## 2018-10-20 DIAGNOSIS — Z87891 Personal history of nicotine dependence: Secondary | ICD-10-CM | POA: Insufficient documentation

## 2018-10-20 DIAGNOSIS — Z79899 Other long term (current) drug therapy: Secondary | ICD-10-CM | POA: Insufficient documentation

## 2018-10-20 DIAGNOSIS — Z3A18 18 weeks gestation of pregnancy: Secondary | ICD-10-CM

## 2018-10-20 DIAGNOSIS — Z7951 Long term (current) use of inhaled steroids: Secondary | ICD-10-CM | POA: Diagnosis not present

## 2018-10-20 DIAGNOSIS — O10912 Unspecified pre-existing hypertension complicating pregnancy, second trimester: Secondary | ICD-10-CM | POA: Diagnosis not present

## 2018-10-20 DIAGNOSIS — R42 Dizziness and giddiness: Secondary | ICD-10-CM | POA: Diagnosis not present

## 2018-10-20 DIAGNOSIS — G43909 Migraine, unspecified, not intractable, without status migrainosus: Secondary | ICD-10-CM | POA: Insufficient documentation

## 2018-10-20 DIAGNOSIS — O162 Unspecified maternal hypertension, second trimester: Secondary | ICD-10-CM | POA: Diagnosis not present

## 2018-10-20 DIAGNOSIS — O10919 Unspecified pre-existing hypertension complicating pregnancy, unspecified trimester: Secondary | ICD-10-CM

## 2018-10-20 DIAGNOSIS — O99512 Diseases of the respiratory system complicating pregnancy, second trimester: Secondary | ICD-10-CM | POA: Diagnosis not present

## 2018-10-20 DIAGNOSIS — O26892 Other specified pregnancy related conditions, second trimester: Secondary | ICD-10-CM | POA: Diagnosis not present

## 2018-10-20 DIAGNOSIS — Z8249 Family history of ischemic heart disease and other diseases of the circulatory system: Secondary | ICD-10-CM | POA: Diagnosis not present

## 2018-10-20 DIAGNOSIS — F329 Major depressive disorder, single episode, unspecified: Secondary | ICD-10-CM | POA: Insufficient documentation

## 2018-10-20 DIAGNOSIS — J45909 Unspecified asthma, uncomplicated: Secondary | ICD-10-CM | POA: Diagnosis not present

## 2018-10-20 LAB — URINALYSIS, ROUTINE W REFLEX MICROSCOPIC
Bilirubin Urine: NEGATIVE
Glucose, UA: NEGATIVE mg/dL
Hgb urine dipstick: NEGATIVE
Ketones, ur: NEGATIVE mg/dL
Nitrite: NEGATIVE
Protein, ur: NEGATIVE mg/dL
Specific Gravity, Urine: 1.017 (ref 1.005–1.030)
pH: 5 (ref 5.0–8.0)

## 2018-10-20 LAB — CBC
HCT: 35.3 % — ABNORMAL LOW (ref 36.0–46.0)
Hemoglobin: 11.8 g/dL — ABNORMAL LOW (ref 12.0–15.0)
MCH: 29.4 pg (ref 26.0–34.0)
MCHC: 33.4 g/dL (ref 30.0–36.0)
MCV: 87.8 fL (ref 80.0–100.0)
Platelets: 207 10*3/uL (ref 150–400)
RBC: 4.02 MIL/uL (ref 3.87–5.11)
RDW: 13 % (ref 11.5–15.5)
WBC: 9.6 10*3/uL (ref 4.0–10.5)
nRBC: 0 % (ref 0.0–0.2)

## 2018-10-20 MED ORDER — LABETALOL HCL 100 MG PO TABS: 100.0000 mg | ORAL_TABLET | Freq: Two times a day (BID) | ORAL | 2 refills | Status: DC

## 2018-10-20 NOTE — MAU Note (Signed)
Urine in lab 

## 2018-10-20 NOTE — MAU Note (Signed)
Was at work on Sunday, and for the past wk she has been having these dizzy spells.  Thinks her BP is up at times because her hands are swelling and sweaty.  Went to UC on Sat, BP was up. Called OB, was told to come over here.  Has CHTN, not currently on BP meds, taking daily baby ASA

## 2018-10-20 NOTE — Discharge Instructions (Signed)
Hypertension During Pregnancy ° °Hypertension is also called high blood pressure. High blood pressure means that the force of your blood moving in your body is too strong. When you are pregnant, this condition should be watched carefully. It can cause problems for you and your baby. °Follow these instructions at home: °Eating and drinking ° °· Drink enough fluid to keep your pee (urine) pale yellow. °· Avoid caffeine. °Lifestyle °· Do not use any products that contain nicotine or tobacco, such as cigarettes and e-cigarettes. If you need help quitting, ask your doctor. °· Do not use alcohol or drugs. °· Avoid stress. °· Rest and get plenty of sleep. °General instructions °· Take over-the-counter and prescription medicines only as told by your doctor. °· While lying down, lie on your left side. This keeps pressure off your major blood vessels. °· While sitting or lying down, raise (elevate) your feet. Try putting some pillows under your lower legs. °· Exercise regularly. Ask your doctor what kinds of exercise are best for you. °· Keep all prenatal and follow-up visits as told by your doctor. This is important. °Contact a doctor if: °· You have symptoms that your doctor told you to watch for, such as: °? Throwing up (vomiting). °? Feeling sick to your stomach (nausea). °? Headache. °Get help right away if you have: °· Very bad belly pain that does not get better with treatment. °· A very bad headache that does not get better. °· Throwing up that does not get better with treatment. °· Sudden, fast weight gain. °· Sudden swelling in your hands, ankles, or face. °· Bleeding from your vagina. °· Blood in your pee. °· Fewer movements from your baby than usual. °· Blurry vision. °· Double vision. °· Muscle twitching. °· Sudden muscle tightening (spasms). °· Trouble breathing. °· Blue fingernails or lips. °Summary °· Hypertension is also called high blood pressure. High blood pressure means that the force of your blood moving  in your body is too strong. °· When you are pregnant, this condition should be watched carefully. It can cause problems for you and your baby. °· Get help right away if you have symptoms that your doctor told you to watch for. °This information is not intended to replace advice given to you by your health care provider. Make sure you discuss any questions you have with your health care provider. °Document Released: 11/17/2010 Document Revised: 10/01/2017 Document Reviewed: 06/26/2016 °Elsevier Interactive Patient Education © 2019 Elsevier Inc. ° °

## 2018-10-20 NOTE — MAU Provider Note (Signed)
History     CSN: [id number]  Arrival date and time: 10/20/18 1618   First Provider Initiated Contact with Patient 10/20/18 1656      Chief Complaint  Patient presents with  . Hypertension   Tara Blake is a 22 y.o. G3P2 at [redacted]w[redacted]d who presents to MAU with complaints of HTN. She has a hx of HTN during this pregnancy. Reports dizziness when at work that has been occurring daily this week, went to urgent care today and was noted to have elevated BP during orthostatic vital signs. Called the office at [facility] and was told to present to MAU for PEC workup. She reports getting dizzy at work around 1400-1500 daily, reports eating ramen noodles for lunch most days and denies eating protein. She denies HA, urinary symptoms, abdominal pain or vaginal bleeding.    OB History    Gravida  3   Para  2   Term  2   Preterm      AB      Living  2     SAB      TAB      Ectopic      Multiple      Live Births  2           Past Medical History:  Diagnosis Date  . Anemia   . Asthma   . Depression   . Essential hypertension   . Hypertension   . Migraine   . Personal history of self-harm   . UTI (urinary tract infection)     Past Surgical History:  Procedure Laterality Date  . TONSILLECTOMY    . WISDOM TOOTH EXTRACTION      Family History  Problem Relation Age of Onset  . Hypertension Father   . Cancer Paternal Uncle     Social History   Tobacco Use  . Smoking status: Former Smoker    Types: E-cigarettes  . Smokeless tobacco: Never Used  Substance Use Topics  . Alcohol use: Not Currently    Comment: monthly or less 1-2 drinks  . Drug use: Not Currently    Types: Marijuana    Comment: UDS positive THC    Allergies:  Allergies  Allergen Reactions  . Amoxicillin Rash    Medications Prior to Admission  Medication Sig Dispense Refill Last Dose  . aspirin EC 81 MG tablet Take 1 tablet (81 mg total) by mouth daily. Take after 12 weeks for prevention of  preeclampsia later in pregnancy 300 tablet 2 Taking  . citalopram (CELEXA) 10 MG tablet Take 1 tablet (10 mg total) by mouth daily. For depression (Patient not taking: Reported on 09/04/2018) 30 tablet 0 Not Taking  . Doxylamine-Pyridoxine ER (BONJESTA) 20-20 MG TBCR Take 1 tablet by mouth every 8 (eight) hours as needed. 60 tablet 2 Taking  . fluticasone (FLONASE) 50 MCG/ACT nasal spray Place 1 spray into both nostrils daily. 16 g 2 Taking  . hydrOXYzine (ATARAX/VISTARIL) 25 MG tablet Take 1 tablet (25 mg total) by mouth at bedtime as needed (sleep). (Patient not taking: Reported on 09/04/2018) 60 tablet 0 Not Taking  . Prenatal Vit-Fe Fumarate-FA (PRENATAL MULTIVITAMIN) TABS tablet Take 1 tablet by mouth daily at 12 noon.   Taking    Review of Systems  Constitutional:       Hypertension  Respiratory: Negative.   Cardiovascular: Negative.   Gastrointestinal: Negative.   Genitourinary: Negative.   Neurological: Positive for dizziness. Negative for weakness, light-headedness and headaches.   Physical Exam  Patient Vitals for the past 24 hrs:  BP Temp Temp src Pulse Resp SpO2 Weight  10/20/18 1746 120/83 - - 92 - - -  10/20/18 1731 118/85 - - 95 - - -  10/20/18 1728 118/87 - - 89 - - -  10/20/18 1700 (!) 127/100 - - (!) 133 - - -  10/20/18 1633 (!) 125/97 98.8 F (37.1 C) Oral 100 18 99 % 107.6 kg   Physical Exam  Nursing note and vitals reviewed. Constitutional: She is oriented to person, place, and time. She appears well-developed and well-nourished. No distress.  Cardiovascular: Normal rate, regular rhythm and normal heart sounds.  Respiratory: Effort normal and breath sounds normal. No respiratory distress. She has no wheezes.  GI: Soft.  Gravid appropriate for gestational age  Musculoskeletal: Normal range of motion.        General: No edema.  Neurological: She is alert and oriented to person, place, and time. She displays normal reflexes.   FHR 143 by doppler   MAU Course   Procedures  MDM Orders Placed This Encounter  Procedures  . CBC  . Urinalysis, Routine w reflex microscopic  . Vital signs   Results for orders placed or performed during the hospital encounter of 10/20/18 (from the past 24 hour(s))  Urinalysis, Routine w reflex microscopic     Status: Abnormal   Collection Time: 10/20/18  4:55 PM  Result Value Ref Range   Color, Urine YELLOW YELLOW   APPearance HAZY (A) CLEAR   Specific Gravity, Urine 1.017 1.005 - 1.030   pH 5.0 5.0 - 8.0   Glucose, UA NEGATIVE NEGATIVE mg/dL   Hgb urine dipstick NEGATIVE NEGATIVE   Bilirubin Urine NEGATIVE NEGATIVE   Ketones, ur NEGATIVE NEGATIVE mg/dL   Protein, ur NEGATIVE NEGATIVE mg/dL   Nitrite NEGATIVE NEGATIVE   Leukocytes, UA TRACE (A) NEGATIVE   RBC / HPF 0-5 0 - 5 RBC/hpf   WBC, UA 0-5 0 - 5 WBC/hpf   Bacteria, UA RARE (A) NONE SEEN   Squamous Epithelial / LPF 6-10 0 - 5   Mucus PRESENT   CBC     Status: Abnormal   Collection Time: 10/20/18  5:30 PM  Result Value Ref Range   WBC 9.6 4.0 - 10.5 K/uL   RBC 4.02 3.87 - 5.11 MIL/uL   Hemoglobin 11.8 (L) 12.0 - 15.0 g/dL   HCT 40.935.3 (L) 81.136.0 - 91.446.0 %   MCV 87.8 80.0 - 100.0 fL   MCH 29.4 26.0 - 34.0 pg   MCHC 33.4 30.0 - 36.0 g/dL   RDW 78.213.0 95.611.5 - 21.315.5 %   Platelets 207 150 - 400 K/uL   nRBC 0.0 0.0 - 0.2 %   Labs reviewed: CBC and UA WNL   Educated on CHTN vs PEC. Educated and discussed healthy eating during pregnancy and reduction of salt in diet. Rx for Labetalol initiated with BP check in office in 1 week at prenatal appointment. Discussed reasons to return to MAU. Follow up as scheduled. Pt stable at time of discharge.   Assessment and Plan   1. Chronic hypertension during pregnancy, antepartum   2. Orthostatic dizziness    Discharge home  Rx for Labetalol  Follow up as scheduled  Discussed reasons to return to MAU   Follow-up Information    [facility] Go [facility]2018.   Specialty:  Obstetrics and  Gynecology Why:  Go to prenatal appointment as scheduled Contact information: [address],  [address] [place] [id number] [phone]         Allergies as of 10/20/2018      Reactions   Amoxicillin Rash      Medication List    TAKE these medications   aspirin EC 81 MG tablet Take 1 tablet (81 mg total) by mouth daily. Take after 12 weeks for prevention of preeclampsia later in pregnancy   citalopram 10 MG tablet Commonly known as:  CELEXA Take 1 tablet (10 mg total) by mouth daily. For depression   Doxylamine-Pyridoxine ER 20-20 MG Tbcr Commonly known as:  BONJESTA Take 1 tablet by mouth every 8 (eight) hours as needed.   fluticasone 50 MCG/ACT nasal spray Commonly known as:  FLONASE Place 1 spray into both nostrils daily.   hydrOXYzine 25 MG tablet Commonly known as:  ATARAX/VISTARIL Take 1 tablet (25 mg total) by mouth at bedtime as needed (sleep).   labetalol 100 MG tablet Commonly known as:  NORMODYNE Take 1 tablet (100 mg total) by mouth 2 (two) times daily.   prenatal multivitamin Tabs tablet Take 1 tablet by mouth daily at 12 noon.       Sharyon CableVeronica C Aleene Swanner CNM 10/20/2018, 8:39 PM

## 2018-10-20 NOTE — MAU Note (Signed)
Pt states she has had some rectal bleeding today, thinks she may have hemrrohoids.  Denies vaginal bleeding.

## 2018-10-20 NOTE — Telephone Encounter (Signed)
Pt called complaining of feeling dizzy and lightheaded with elevated bp for the past week. Pt states that her bp wat urgent care this weekend was 150s/100s. Pt advised to go to MAU to be evaluated for sx.

## 2018-10-29 NOTE — L&D Delivery Note (Signed)
[facility]N [facility] Delivery Note  Amariya Simer is a 23 y.o. now G3P2002 s/p SVD at [redacted]w[redacted]d who was admitted for IOL for cHTN.   ROM: 6h 54m with clear fluid GBS Status: Negative Maximum Maternal Temperature: 98.2  Delivery Note At 9:07 PM a viable female was delivered via Vaginal, Spontaneous (Presentation: LOA).  APGAR: 8, 9; weight 9 lbs 10.5 oz. (4380 gm)  Placenta status: spontaneous via Tomasa Blase; intact.  Cord: 3VC with no complications.  Cord pH: n/a  Anesthesia: Epidural Episiotomy: None Lacerations: RT Labia minora abrasion - no repair needed Suture Repair: n/a QBL (mL): 721  Postpartum Planning [x]  Mom to postpartum.  Baby to Couplet care / Skin to Skin. [x]  plans to breast/bottle feed [x]  message to sent to schedule follow-up  [x]  vaccines UTD  Raelyn Mora, MSN, CNM 03/13/19, 9:41 PM

## 2018-10-30 ENCOUNTER — Encounter: Payer: Self-pay | Admitting: Medical

## 2018-10-30 ENCOUNTER — Ambulatory Visit (INDEPENDENT_AMBULATORY_CARE_PROVIDER_SITE_OTHER): Payer: Medicaid Other | Admitting: Medical

## 2018-10-30 VITALS — BP 132/85 | HR 98 | Wt 235.8 lb

## 2018-10-30 DIAGNOSIS — O10912 Unspecified pre-existing hypertension complicating pregnancy, second trimester: Secondary | ICD-10-CM

## 2018-10-30 DIAGNOSIS — O10919 Unspecified pre-existing hypertension complicating pregnancy, unspecified trimester: Secondary | ICD-10-CM

## 2018-10-30 DIAGNOSIS — O0990 Supervision of high risk pregnancy, unspecified, unspecified trimester: Secondary | ICD-10-CM | POA: Diagnosis not present

## 2018-10-30 DIAGNOSIS — O26892 Other specified pregnancy related conditions, second trimester: Secondary | ICD-10-CM

## 2018-10-30 DIAGNOSIS — O26899 Other specified pregnancy related conditions, unspecified trimester: Secondary | ICD-10-CM

## 2018-10-30 DIAGNOSIS — O0992 Supervision of high risk pregnancy, unspecified, second trimester: Secondary | ICD-10-CM

## 2018-10-30 DIAGNOSIS — Z6791 Unspecified blood type, Rh negative: Secondary | ICD-10-CM

## 2018-10-30 NOTE — Progress Notes (Signed)
Pt is here for ROB. [redacted]w[redacted]d.

## 2018-10-30 NOTE — Patient Instructions (Signed)
Hypertension During Pregnancy ° °Hypertension is also called high blood pressure. High blood pressure means that the force of your blood moving in your body is too strong. When you are pregnant, this condition should be watched carefully. It can cause problems for you and your baby. °Follow these instructions at home: °Eating and drinking ° °· Drink enough fluid to keep your pee (urine) pale yellow. °· Avoid caffeine. °Lifestyle °· Do not use any products that contain nicotine or tobacco, such as cigarettes and e-cigarettes. If you need help quitting, ask your doctor. °· Do not use alcohol or drugs. °· Avoid stress. °· Rest and get plenty of sleep. °General instructions °· Take over-the-counter and prescription medicines only as told by your doctor. °· While lying down, lie on your left side. This keeps pressure off your major blood vessels. °· While sitting or lying down, raise (elevate) your feet. Try putting some pillows under your lower legs. °· Exercise regularly. Ask your doctor what kinds of exercise are best for you. °· Keep all prenatal and follow-up visits as told by your doctor. This is important. °Contact a doctor if: °· You have symptoms that your doctor told you to watch for, such as: °? Throwing up (vomiting). °? Feeling sick to your stomach (nausea). °? Headache. °Get help right away if you have: °· Very bad belly pain that does not get better with treatment. °· A very bad headache that does not get better. °· Throwing up that does not get better with treatment. °· Sudden, fast weight gain. °· Sudden swelling in your hands, ankles, or face. °· Bleeding from your vagina. °· Blood in your pee. °· Fewer movements from your baby than usual. °· Blurry vision. °· Double vision. °· Muscle twitching. °· Sudden muscle tightening (spasms). °· Trouble breathing. °· Blue fingernails or lips. °Summary °· Hypertension is also called high blood pressure. High blood pressure means that the force of your blood moving  in your body is too strong. °· When you are pregnant, this condition should be watched carefully. It can cause problems for you and your baby. °· Get help right away if you have symptoms that your doctor told you to watch for. °This information is not intended to replace advice given to you by your health care provider. Make sure you discuss any questions you have with your health care provider. °Document Released: 11/17/2010 Document Revised: 10/01/2017 Document Reviewed: 06/26/2016 °Elsevier Interactive Patient Education © 2019 Elsevier Inc. ° °
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
## 2018-11-01 LAB — AFP, SERUM, OPEN SPINA BIFIDA
AFP MOM: 0.96
AFP Value: 40.9 ng/mL
GEST. AGE ON COLLECTION DATE: 19.9 wk
Maternal Age At EDD: 22.4 yr
OSBR Risk 1 IN: 10000
TEST RESULTS AFP: NEGATIVE
Weight: 235 [lb_av]

## 2018-11-04 ENCOUNTER — Other Ambulatory Visit (HOSPITAL_COMMUNITY): Payer: Self-pay | Admitting: *Deleted

## 2018-11-04 ENCOUNTER — Ambulatory Visit (HOSPITAL_COMMUNITY)
Admission: RE | Admit: 2018-11-04 | Discharge: 2018-11-04 | Disposition: A | Discharge status: Home / Self Care | Payer: Medicaid Other | Source: Ambulatory Visit | Attending: Family | Admitting: Family

## 2018-11-04 ENCOUNTER — Encounter (HOSPITAL_COMMUNITY): Payer: Self-pay

## 2018-11-04 DIAGNOSIS — Z3A20 20 weeks gestation of pregnancy: Secondary | ICD-10-CM

## 2018-11-04 DIAGNOSIS — O09299 Supervision of pregnancy with other poor reproductive or obstetric history, unspecified trimester: Secondary | ICD-10-CM

## 2018-11-04 DIAGNOSIS — O10919 Unspecified pre-existing hypertension complicating pregnancy, unspecified trimester: Secondary | ICD-10-CM | POA: Insufficient documentation

## 2018-11-04 DIAGNOSIS — O10912 Unspecified pre-existing hypertension complicating pregnancy, second trimester: Secondary | ICD-10-CM

## 2018-11-04 DIAGNOSIS — Z362 Encounter for other antenatal screening follow-up: Secondary | ICD-10-CM

## 2018-11-04 DIAGNOSIS — O99212 Obesity complicating pregnancy, second trimester: Secondary | ICD-10-CM

## 2018-11-04 DIAGNOSIS — O09292 Supervision of pregnancy with other poor reproductive or obstetric history, second trimester: Secondary | ICD-10-CM | POA: Diagnosis not present

## 2018-11-04 DIAGNOSIS — Z6791 Unspecified blood type, Rh negative: Secondary | ICD-10-CM | POA: Insufficient documentation

## 2018-11-04 DIAGNOSIS — O26899 Other specified pregnancy related conditions, unspecified trimester: Secondary | ICD-10-CM | POA: Insufficient documentation

## 2018-11-28 ENCOUNTER — Encounter: Payer: Self-pay | Admitting: Medical

## 2018-11-28 ENCOUNTER — Ambulatory Visit (INDEPENDENT_AMBULATORY_CARE_PROVIDER_SITE_OTHER): Payer: Medicaid Other | Admitting: Medical

## 2018-11-28 ENCOUNTER — Other Ambulatory Visit: Payer: Self-pay

## 2018-11-28 VITALS — BP 121/95 | HR 105 | Wt 242.5 lb

## 2018-11-28 DIAGNOSIS — O9989 Other specified diseases and conditions complicating pregnancy, childbirth and the puerperium: Secondary | ICD-10-CM

## 2018-11-28 DIAGNOSIS — O26899 Other specified pregnancy related conditions, unspecified trimester: Secondary | ICD-10-CM

## 2018-11-28 DIAGNOSIS — O10919 Unspecified pre-existing hypertension complicating pregnancy, unspecified trimester: Secondary | ICD-10-CM | POA: Diagnosis not present

## 2018-11-28 DIAGNOSIS — O26892 Other specified pregnancy related conditions, second trimester: Secondary | ICD-10-CM

## 2018-11-28 DIAGNOSIS — O09292 Supervision of pregnancy with other poor reproductive or obstetric history, second trimester: Secondary | ICD-10-CM

## 2018-11-28 DIAGNOSIS — O09299 Supervision of pregnancy with other poor reproductive or obstetric history, unspecified trimester: Secondary | ICD-10-CM

## 2018-11-28 DIAGNOSIS — Z6791 Unspecified blood type, Rh negative: Secondary | ICD-10-CM

## 2018-11-28 DIAGNOSIS — O99212 Obesity complicating pregnancy, second trimester: Secondary | ICD-10-CM

## 2018-11-28 DIAGNOSIS — O0992 Supervision of high risk pregnancy, unspecified, second trimester: Secondary | ICD-10-CM

## 2018-11-28 DIAGNOSIS — O10912 Unspecified pre-existing hypertension complicating pregnancy, second trimester: Secondary | ICD-10-CM

## 2018-11-28 DIAGNOSIS — M549 Dorsalgia, unspecified: Secondary | ICD-10-CM

## 2018-11-28 DIAGNOSIS — O99210 Obesity complicating pregnancy, unspecified trimester: Secondary | ICD-10-CM

## 2018-11-28 DIAGNOSIS — O0990 Supervision of high risk pregnancy, unspecified, unspecified trimester: Secondary | ICD-10-CM

## 2018-11-28 MED ORDER — CYCLOBENZAPRINE HCL 10 MG PO TABS: 10.0000 mg | ORAL_TABLET | Freq: Two times a day (BID) | ORAL | 0 refills | Status: DC | PRN
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
## 2018-11-28 NOTE — Patient Instructions (Addendum)
Second Trimester of Pregnancy  The second trimester is from week 14 through week 27 (month 4 through 6). This is often the time in pregnancy that you feel your best. Often times, morning sickness has lessened or quit. You may have more energy, and you may get hungry more often. Your unborn baby is growing rapidly. At the end of the sixth month, he or she is about 9 inches long and weighs about 1 pounds. You will likely feel the baby move between 18 and 20 weeks of pregnancy. Follow these instructions at home: Medicines  Take over-the-counter and prescription medicines only as told by your doctor. Some medicines are safe and some medicines are not safe during pregnancy.  Take a prenatal vitamin that contains at least 600 micrograms (mcg) of folic acid.  If you have trouble pooping (constipation), take medicine that will make your stool soft (stool softener) if your doctor approves. Eating and drinking   Eat regular, healthy meals.  Avoid raw meat and uncooked cheese.  If you get low calcium from the food you eat, talk to your doctor about taking a daily calcium supplement.  Avoid foods that are high in fat and sugars, such as fried and sweet foods.  If you feel sick to your stomach (nauseous) or throw up (vomit): ? Eat 4 or 5 small meals a day instead of 3 large meals. ? Try eating a few soda crackers. ? Drink liquids between meals instead of during meals.  To prevent constipation: ? Eat foods that are high in fiber, like fresh fruits and vegetables, whole grains, and beans. ? Drink enough fluids to keep your pee (urine) clear or pale yellow. Activity  Exercise only as told by your doctor. Stop exercising if you start to have cramps.  Do not exercise if it is too hot, too humid, or if you are in a place of great height (high altitude).  Avoid heavy lifting.  Wear low-heeled shoes. Sit and stand up straight.  You can continue to have sex unless your doctor tells you not  to. Relieving pain and discomfort  Wear a good support bra if your breasts are tender.  Take warm water baths (sitz baths) to soothe pain or discomfort caused by hemorrhoids. Use hemorrhoid cream if your doctor approves.  Rest with your legs raised if you have leg cramps or low back pain.  If you develop puffy, bulging veins (varicose veins) in your legs: ? Wear support hose or compression stockings as told by your doctor. ? Raise (elevate) your feet for 15 minutes, 3-4 times a day. ? Limit salt in your food. Prenatal care  Write down your questions. Take them to your prenatal visits.  Keep all your prenatal visits as told by your doctor. This is important. Safety  Wear your seat belt when driving.  Make a list of emergency phone numbers, including numbers for family, friends, the hospital, and police and fire departments. General instructions  Ask your doctor about the right foods to eat or for help finding a counselor, if you need these services.  Ask your doctor about local prenatal classes. Begin classes before month 6 of your pregnancy.  Do not use hot tubs, steam rooms, or saunas.  Do not douche or use tampons or scented sanitary pads.  Do not cross your legs for long periods of time.  Visit your dentist if you have not done so. Use a soft toothbrush to brush your teeth. Floss gently.  Avoid all smoking, herbs,   and alcohol. Avoid drugs that are not approved by your doctor.  Do not use any products that contain nicotine or tobacco, such as cigarettes and e-cigarettes. If you need help quitting, ask your doctor.  Avoid cat litter boxes and soil used by cats. These carry germs that can cause birth defects in the baby and can cause a loss of your baby (miscarriage) or stillbirth. Contact a doctor if:  You have mild cramps or pressure in your lower belly.  You have pain when you pee (urinate).  You have bad smelling fluid coming from your vagina.  You continue to  feel sick to your stomach (nauseous), throw up (vomit), or have watery poop (diarrhea).  You have a nagging pain in your belly area.  You feel dizzy. Get help right away if:  You have a fever.  You are leaking fluid from your vagina.  You have spotting or bleeding from your vagina.  You have severe belly cramping or pain.  You lose or gain weight rapidly.  You have trouble catching your breath and have chest pain.  You notice sudden or extreme puffiness (swelling) of your face, hands, ankles, feet, or legs.  You have not felt the baby move in over an hour.  You have severe headaches that do not go away when you take medicine.  You have trouble seeing. Summary  The second trimester is from week 14 through week 27 (months 4 through 6). This is often the time in pregnancy that you feel your best.  To take care of yourself and your unborn baby, you will need to eat healthy meals, take medicines only if your doctor tells you to do so, and do activities that are safe for you and your baby.  Call your doctor if you get sick or if you notice anything unusual about your pregnancy. Also, call your doctor if you need help with the right food to eat, or if you want to know what activities are safe for you. This information is not intended to replace advice given to you by your health care provider. Make sure you discuss any questions you have with your health care provider. Document Released: 01/09/2010 Document Revised: 11/20/2016 Document Reviewed: 11/20/2016 Elsevier Interactive Patient Education  2019 Elsevier Inc.  Back Pain in Pregnancy Back pain during pregnancy is common. Back pain may be caused by several factors that are related to changes during your pregnancy. Follow these instructions at home: Managing pain, stiffness, and swelling      If directed, for sudden (acute) back pain, put ice on the painful area. ? Put ice in a plastic bag. ? Place a towel between your skin  and the bag. ? Leave the ice on for 20 minutes, 2-3 times per day.  If directed, apply heat to the affected area before you exercise. Use the heat source that your health care provider recommends, such as a moist heat pack or a heating pad. ? Place a towel between your skin and the heat source. ? Leave the heat on for 20-30 minutes. ? Remove the heat if your skin turns bright red. This is especially important if you are unable to feel pain, heat, or cold. You may have a greater risk of getting burned.  If directed, massage the affected area. Activity  Exercise as told by your health care provider. Gentle exercise is the best way to prevent or manage back pain.  Listen to your body when lifting. If lifting hurts, ask for help  for help or bend your knees. This uses your leg muscles instead of your back muscles.  Squat down when picking up something from the floor. Do not bend over.  Only use bed rest for short periods as told by your health care provider. Bed rest should only be used for the most severe episodes of back pain. Standing, sitting, and lying down  Do not stand in one place for long periods of time.  Use good posture when sitting. Make sure your head rests over your shoulders and is not hanging forward. Use a pillow on your lower back if necessary.  Try sleeping on your side, preferably the left side, with a pregnancy support pillow or 1-2 regular pillows between your legs. ? If you have back pain after a night's rest, your bed may be too soft. ? A firm mattress may provide more support for your back during pregnancy. General instructions  Do not wear high heels.  Eat a healthy diet. Try to gain weight within your health care provider's recommendations.  Use a maternity girdle, elastic sling, or back brace as told by your health care provider.  Take over-the-counter and prescription medicines only as told by your health care provider.  Work with a physical therapist or massage  therapist to find ways to manage back pain. Acupuncture or massage therapy may be helpful.  Keep all follow-up visits as told by your health care provider. This is important. Contact a health care provider if:  Your back pain interferes with your daily activities.  You have increasing pain in other parts of your body. Get help right away if:  You develop numbness, tingling, weakness, or problems with the use of your arms or legs.  You develop severe back pain that is not controlled with medicine.  You have a change in bowel or bladder control.  You develop shortness of breath, dizziness, or you faint.  You develop nausea, vomiting, or sweating.  You have back pain that is a rhythmic, cramping pain similar to labor pains. Labor pain is usually 1-2 minutes apart, lasts for about 1 minute, and involves a bearing down feeling or pressure in your pelvis.  You have back pain and your water breaks or you have vaginal bleeding.  You have back pain or numbness that travels down your leg.  Your back pain developed after you fell.  You develop pain on one side of your back.  You see blood in your urine.  You develop skin blisters in the area of your back pain. Summary  Back pain may be caused by several factors that are related to changes during your pregnancy.  Follow instructions as told by your health care provider for managing pain, stiffness, and swelling.  Exercise as told by your health care provider. Gentle exercise is the best way to prevent or manage back pain.  Take over-the-counter and prescription medicines only as told by your health care provider.  Keep all follow-up visits as told by your health care provider. This is important. This information is not intended to replace advice given to you by your health care provider. Make sure you discuss any questions you have with your health care provider. Document Released: 01/23/2006 Document Revised: 04/02/2018 Document  Reviewed: 04/02/2018 Elsevier Interactive Patient Education  2019 Elsevier Inc.   

## 2018-11-28 NOTE — Progress Notes (Signed)
ROB

## 2018-11-29 LAB — COMPREHENSIVE METABOLIC PANEL
ALBUMIN: 3.9 g/dL (ref 3.9–5.0)
ALK PHOS: 58 IU/L (ref 39–117)
ALT: 9 IU/L (ref 0–32)
AST: 11 IU/L (ref 0–40)
Albumin/Globulin Ratio: 1.6 (ref 1.2–2.2)
BILIRUBIN TOTAL: 0.3 mg/dL (ref 0.0–1.2)
BUN / CREAT RATIO: 13 (ref 9–23)
BUN: 7 mg/dL (ref 6–20)
CHLORIDE: 102 mmol/L (ref 96–106)
CO2: 19 mmol/L — AB (ref 20–29)
CREATININE: 0.53 mg/dL — AB (ref 0.57–1.00)
Calcium: 9.4 mg/dL (ref 8.7–10.2)
GFR calc Af Amer: 156 mL/min/{1.73_m2} (ref >59)
GFR calc non Af Amer: 135 mL/min/{1.73_m2} (ref >59)
GLUCOSE: 74 mg/dL (ref 65–99)
Globulin, Total: 2.5 g/dL (ref 1.5–4.5)
Potassium: 4.8 mmol/L (ref 3.5–5.2)
SODIUM: 137 mmol/L (ref 134–144)
Total Protein: 6.4 g/dL (ref 6.0–8.5)

## 2018-11-29 LAB — PROTEIN / CREATININE RATIO, URINE
CREATININE, UR: 162.9 mg/dL
PROTEIN UR: 15.9 mg/dL
Protein/Creat Ratio: 98 mg/g creat (ref 0–200)

## 2018-12-01 ENCOUNTER — Other Ambulatory Visit: Payer: Self-pay

## 2018-12-01 DIAGNOSIS — O9989 Other specified diseases and conditions complicating pregnancy, childbirth and the puerperium: Principal | ICD-10-CM

## 2018-12-01 DIAGNOSIS — M549 Dorsalgia, unspecified: Secondary | ICD-10-CM

## 2018-12-01 DIAGNOSIS — O99891 Other specified diseases and conditions complicating pregnancy: Secondary | ICD-10-CM

## 2018-12-01 MED ORDER — CYCLOBENZAPRINE HCL 10 MG PO TABS: 10.0000 mg | ORAL_TABLET | Freq: Two times a day (BID) | ORAL | 0 refills | Status: DC | PRN

## 2018-12-01 NOTE — Progress Notes (Signed)
Rx was not sent and pt was not given paper Rx I consulted with Dr. Jolayne Panther ok to send Rx.

## 2018-12-01 NOTE — Progress Notes (Signed)
Rx was printed  Rx has now been sent electronically.

## 2018-12-02 ENCOUNTER — Ambulatory Visit (HOSPITAL_COMMUNITY)
Admission: RE | Admit: 2018-12-02 | Discharge: 2018-12-02 | Disposition: A | Discharge status: Home / Self Care | Payer: Medicaid Other | Source: Ambulatory Visit | Attending: Family | Admitting: Family

## 2018-12-02 ENCOUNTER — Encounter (HOSPITAL_COMMUNITY): Payer: Self-pay

## 2018-12-02 ENCOUNTER — Other Ambulatory Visit (HOSPITAL_COMMUNITY): Payer: Self-pay | Admitting: *Deleted

## 2018-12-02 DIAGNOSIS — Z362 Encounter for other antenatal screening follow-up: Secondary | ICD-10-CM | POA: Diagnosis not present

## 2018-12-02 DIAGNOSIS — O09292 Supervision of pregnancy with other poor reproductive or obstetric history, second trimester: Secondary | ICD-10-CM

## 2018-12-02 DIAGNOSIS — O09299 Supervision of pregnancy with other poor reproductive or obstetric history, unspecified trimester: Secondary | ICD-10-CM

## 2018-12-02 DIAGNOSIS — O99212 Obesity complicating pregnancy, second trimester: Secondary | ICD-10-CM | POA: Diagnosis not present

## 2018-12-02 DIAGNOSIS — O26899 Other specified pregnancy related conditions, unspecified trimester: Secondary | ICD-10-CM | POA: Diagnosis not present

## 2018-12-02 DIAGNOSIS — Z3A24 24 weeks gestation of pregnancy: Secondary | ICD-10-CM

## 2018-12-02 DIAGNOSIS — Z6791 Unspecified blood type, Rh negative: Secondary | ICD-10-CM | POA: Insufficient documentation

## 2018-12-02 DIAGNOSIS — O10919 Unspecified pre-existing hypertension complicating pregnancy, unspecified trimester: Secondary | ICD-10-CM

## 2018-12-02 DIAGNOSIS — O10012 Pre-existing essential hypertension complicating pregnancy, second trimester: Secondary | ICD-10-CM

## 2018-12-26 ENCOUNTER — Encounter: Payer: Self-pay | Admitting: Medical

## 2018-12-26 ENCOUNTER — Ambulatory Visit (INDEPENDENT_AMBULATORY_CARE_PROVIDER_SITE_OTHER): Payer: Medicaid Other | Admitting: Medical

## 2018-12-26 ENCOUNTER — Other Ambulatory Visit: Payer: Medicaid Other

## 2018-12-26 VITALS — BP 159/98 | HR 104 | Wt 248.8 lb

## 2018-12-26 DIAGNOSIS — Z23 Encounter for immunization: Secondary | ICD-10-CM

## 2018-12-26 DIAGNOSIS — O9989 Other specified diseases and conditions complicating pregnancy, childbirth and the puerperium: Secondary | ICD-10-CM

## 2018-12-26 DIAGNOSIS — Z3A28 28 weeks gestation of pregnancy: Secondary | ICD-10-CM

## 2018-12-26 DIAGNOSIS — Z6791 Unspecified blood type, Rh negative: Secondary | ICD-10-CM

## 2018-12-26 DIAGNOSIS — O10919 Unspecified pre-existing hypertension complicating pregnancy, unspecified trimester: Secondary | ICD-10-CM | POA: Diagnosis not present

## 2018-12-26 DIAGNOSIS — O0990 Supervision of high risk pregnancy, unspecified, unspecified trimester: Secondary | ICD-10-CM | POA: Diagnosis not present

## 2018-12-26 DIAGNOSIS — O10913 Unspecified pre-existing hypertension complicating pregnancy, third trimester: Secondary | ICD-10-CM

## 2018-12-26 DIAGNOSIS — O26899 Other specified pregnancy related conditions, unspecified trimester: Secondary | ICD-10-CM

## 2018-12-26 DIAGNOSIS — O99210 Obesity complicating pregnancy, unspecified trimester: Secondary | ICD-10-CM

## 2018-12-26 DIAGNOSIS — O360130 Maternal care for anti-D [Rh] antibodies, third trimester, not applicable or unspecified: Secondary | ICD-10-CM

## 2018-12-26 DIAGNOSIS — G44219 Episodic tension-type headache, not intractable: Secondary | ICD-10-CM

## 2018-12-26 DIAGNOSIS — O99213 Obesity complicating pregnancy, third trimester: Secondary | ICD-10-CM

## 2018-12-26 DIAGNOSIS — O0993 Supervision of high risk pregnancy, unspecified, third trimester: Secondary | ICD-10-CM

## 2018-12-26 DIAGNOSIS — M549 Dorsalgia, unspecified: Secondary | ICD-10-CM

## 2018-12-26 DIAGNOSIS — O99891 Other specified diseases and conditions complicating pregnancy: Secondary | ICD-10-CM

## 2018-12-26 LAB — OB RESULTS CONSOLE ABO/RH: RH Type: NEGATIVE

## 2018-12-26 MED ORDER — CYCLOBENZAPRINE HCL 10 MG PO TABS: 10.0000 mg | ORAL_TABLET | Freq: Two times a day (BID) | ORAL | 0 refills | Status: DC | PRN

## 2018-12-26 MED ORDER — LABETALOL HCL 200 MG PO TABS: 200.0000 mg | ORAL_TABLET | Freq: Two times a day (BID) | ORAL | 3 refills | Status: DC

## 2018-12-26 MED ORDER — RHO D IMMUNE GLOBULIN 1500 UNIT/2ML IJ SOSY
300.0000 ug | PREFILLED_SYRINGE | Freq: Once | INTRAMUSCULAR | Status: AC
  Administered 2018-12-26: 300 ug via INTRAMUSCULAR
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
## 2018-12-26 NOTE — Patient Instructions (Signed)
Fetal Movement Counts Patient Name: ________________________________________________ Patient Due Date: ____________________ What is a fetal movement count?  A fetal movement count is the number of times that you feel your baby move during a certain amount of time. This may also be called a fetal kick count. A fetal movement count is recommended for every pregnant woman. You may be asked to start counting fetal movements as early as week 28 of your pregnancy. Pay attention to when your baby is most active. You may notice your baby's sleep and wake cycles. You may also notice things that make your baby move more. You should do a fetal movement count:  When your baby is normally most active.  At the same time each day. A good time to count movements is while you are resting, after having something to eat and drink. How do I count fetal movements? 1. Find a quiet, comfortable area. Sit, or lie down on your side. 2. Write down the date, the start time and stop time, and the number of movements that you felt between those two times. Take this information with you to your health care visits. 3. For 2 hours, count kicks, flutters, swishes, rolls, and jabs. You should feel at least 10 movements during 2 hours. 4. You may stop counting after you have felt 10 movements. 5. If you do not feel 10 movements in 2 hours, have something to eat and drink. Then, keep resting and counting for 1 hour. If you feel at least 4 movements during that hour, you may stop counting. Contact a health care provider if:  You feel fewer than 4 movements in 2 hours.  Your baby is not moving like he or she usually does. Date: ____________ Start time: ____________ Stop time: ____________ Movements: ____________ Date: ____________ Start time: ____________ Stop time: ____________ Movements: ____________ Date: ____________ Start time: ____________ Stop time: ____________ Movements: ____________ Date: ____________ Start time:  ____________ Stop time: ____________ Movements: ____________ Date: ____________ Start time: ____________ Stop time: ____________ Movements: ____________ Date: ____________ Start time: ____________ Stop time: ____________ Movements: ____________ Date: ____________ Start time: ____________ Stop time: ____________ Movements: ____________ Date: ____________ Start time: ____________ Stop time: ____________ Movements: ____________ Date: ____________ Start time: ____________ Stop time: ____________ Movements: ____________ This information is not intended to replace advice given to you by your health care provider. Make sure you discuss any questions you have with your health care provider. Document Released: 11/14/2006 Document Revised: 06/13/2016 Document Reviewed: 11/24/2015 Elsevier Interactive Patient Education  2019 Else[employer] Inc. Braxton Hicks Contractions Contractions of the uterus can occur throughout pregnancy, but they are not always a sign that you are in labor. You may have practice contractions called Braxton Hicks contractions. These false labor contractions are sometimes confused with true labor. What are Braxton Hicks contractions? Braxton Hicks contractions are tightening movements that occur in the muscles of the uterus before labor. Unlike true labor contractions, these contractions do not result in opening (dilation) and thinning of the cervix. Toward the end of pregnancy (32-34 weeks), Braxton Hicks contractions can happen more often and may become stronger. These contractions are sometimes difficult to tell apart from true labor because they can be very uncomfortable. You should not feel embarrassed if you go to the hospital with false labor. Sometimes, the only way to tell if you are in true labor is for your health care provider to look for changes in the cervix. The health care provider will do a physical exam and may monitor your contractions. If   you are not in true labor, the exam  should show that your cervix is not dilating and your water has not broken. If there are no other health problems associated with your pregnancy, it is completely safe for you to be sent home with false labor. You may continue to have Braxton Hicks contractions until you go into true labor. How to tell the difference between true labor and false labor True labor  Contractions last 30-70 seconds.  Contractions become very regular.  Discomfort is usually felt in the top of the uterus, and it spreads to the lower abdomen and low back.  Contractions do not go away with walking.  Contractions usually become more intense and increase in frequency.  The cervix dilates and gets thinner. False labor  Contractions are usually shorter and not as strong as true labor contractions.  Contractions are usually irregular.  Contractions are often felt in the front of the lower abdomen and in the groin.  Contractions may go away when you walk around or change positions while lying down.  Contractions get weaker and are shorter-lasting as time goes on.  The cervix usually does not dilate or become thin. Follow these instructions at home:   Take over-the-counter and prescription medicines only as told by your health care provider.  Keep up with your usual exercises and follow other instructions from your health care provider.  Eat and drink lightly if you think you are going into labor.  If Braxton Hicks contractions are making you uncomfortable: ? Change your position from lying down or resting to walking, or change from walking to resting. ? Sit and rest in a tub of warm water. ? Drink enough fluid to keep your urine pale yellow. Dehydration may cause these contractions. ? Do slow and deep breathing several times an hour.  Keep all follow-up prenatal visits as told by your health care provider. This is important. Contact a health care provider if:  You have a fever.  You have continuous  pain in your abdomen. Get help right away if:  Your contractions become stronger, more regular, and closer together.  You have fluid leaking or gushing from your vagina.  You pass blood-tinged mucus (bloody show).  You have bleeding from your vagina.  You have low back pain that you never had before.  You feel your baby's head pushing down and causing pelvic pressure.  Your baby is not moving inside you as much as it used to. Summary  Contractions that occur before labor are called Braxton Hicks contractions, false labor, or practice contractions.  Braxton Hicks contractions are usually shorter, weaker, farther apart, and less regular than true labor contractions. True labor contractions usually become progressively stronger and regular, and they become more frequent.  Manage discomfort from Braxton Hicks contractions by changing position, resting in a warm bath, drinking plenty of water, or practicing deep breathing. This information is not intended to replace advice given to you by your health care provider. Make sure you discuss any questions you have with your health care provider. Document Released: 02/28/2017 Document Revised: 07/30/2017 Document Reviewed: 02/28/2017 Elsevier Interactive Patient Education  2019 Elsevier Inc.  

## 2018-12-26 NOTE — Progress Notes (Signed)
Pt is here for ROB/2hr GTT. [redacted]w[redacted]d.

## 2018-12-27 LAB — COMPREHENSIVE METABOLIC PANEL
ALT: 9 IU/L (ref 0–32)
AST: 10 IU/L (ref 0–40)
Albumin/Globulin Ratio: 1.6 (ref 1.2–2.2)
Albumin: 3.8 g/dL — ABNORMAL LOW (ref 3.9–5.0)
Alkaline Phosphatase: 70 IU/L (ref 39–117)
BUN / CREAT RATIO: 13 (ref 9–23)
BUN: 6 mg/dL (ref 6–20)
Bilirubin Total: <0.2 mg/dL (ref 0.0–1.2)
CALCIUM: 8.9 mg/dL (ref 8.7–10.2)
CHLORIDE: 107 mmol/L — AB (ref 96–106)
CO2: 18 mmol/L — AB (ref 20–29)
CREATININE: 0.48 mg/dL — AB (ref 0.57–1.00)
GFR calc Af Amer: 161 mL/min/{1.73_m2} (ref >59)
GFR calc non Af Amer: 140 mL/min/{1.73_m2} (ref >59)
GLUCOSE: 125 mg/dL — AB (ref 65–99)
Globulin, Total: 2.4 g/dL (ref 1.5–4.5)
POTASSIUM: 4.1 mmol/L (ref 3.5–5.2)
SODIUM: 138 mmol/L (ref 134–144)
TOTAL PROTEIN: 6.2 g/dL (ref 6.0–8.5)

## 2018-12-27 LAB — GLUCOSE TOLERANCE, 2 HOURS W/ 1HR
Glucose, 1 hour: 126 mg/dL (ref 65–179)
Glucose, 2 hour: 126 mg/dL (ref 65–152)
Glucose, Fasting: 83 mg/dL (ref 65–91)

## 2018-12-27 LAB — CBC
HEMATOCRIT: 35.6 % (ref 34.0–46.6)
HEMOGLOBIN: 11.8 g/dL (ref 11.1–15.9)
MCH: 28.7 pg (ref 26.6–33.0)
MCHC: 33.1 g/dL (ref 31.5–35.7)
MCV: 87 fL (ref 79–97)
Platelets: 217 10*3/uL (ref 150–450)
RBC: 4.11 x10E6/uL (ref 3.77–5.28)
RDW: 12.4 % (ref 11.7–15.4)
WBC: 8.1 10*3/uL (ref 3.4–10.8)

## 2018-12-27 LAB — PROTEIN / CREATININE RATIO, URINE
CREATININE, UR: 252.3 mg/dL
Protein, Ur: 46.8 mg/dL
Protein/Creat Ratio: 185 mg/g creat (ref 0–200)

## 2018-12-27 LAB — HIV ANTIBODY (ROUTINE TESTING W REFLEX): HIV Screen 4th Generation wRfx: NONREACTIVE

## 2018-12-27 LAB — RPR: RPR: NONREACTIVE

## 2018-12-30 ENCOUNTER — Ambulatory Visit (HOSPITAL_COMMUNITY)
Admission: RE | Admit: 2018-12-30 | Discharge: 2018-12-30 | Disposition: A | Discharge status: Home / Self Care | Payer: Medicaid Other | Source: Ambulatory Visit | Attending: Family | Admitting: Family

## 2018-12-30 ENCOUNTER — Ambulatory Visit (HOSPITAL_COMMUNITY): Payer: Medicaid Other | Admitting: *Deleted

## 2018-12-30 ENCOUNTER — Other Ambulatory Visit (HOSPITAL_COMMUNITY): Payer: Self-pay | Admitting: *Deleted

## 2018-12-30 ENCOUNTER — Encounter (HOSPITAL_COMMUNITY): Payer: Self-pay

## 2018-12-30 VITALS — BP 117/86 | HR 89 | Wt 250.6 lb

## 2018-12-30 DIAGNOSIS — O10913 Unspecified pre-existing hypertension complicating pregnancy, third trimester: Secondary | ICD-10-CM

## 2018-12-30 DIAGNOSIS — Z362 Encounter for other antenatal screening follow-up: Secondary | ICD-10-CM

## 2018-12-30 DIAGNOSIS — O10019 Pre-existing essential hypertension complicating pregnancy, unspecified trimester: Secondary | ICD-10-CM | POA: Diagnosis not present

## 2018-12-30 DIAGNOSIS — O99212 Obesity complicating pregnancy, second trimester: Secondary | ICD-10-CM

## 2018-12-30 DIAGNOSIS — Z3A28 28 weeks gestation of pregnancy: Secondary | ICD-10-CM | POA: Diagnosis not present

## 2018-12-30 DIAGNOSIS — O09299 Supervision of pregnancy with other poor reproductive or obstetric history, unspecified trimester: Secondary | ICD-10-CM

## 2018-12-30 DIAGNOSIS — O10919 Unspecified pre-existing hypertension complicating pregnancy, unspecified trimester: Secondary | ICD-10-CM

## 2019-01-09 ENCOUNTER — Encounter: Payer: Self-pay | Admitting: Medical

## 2019-01-09 ENCOUNTER — Other Ambulatory Visit: Payer: Self-pay

## 2019-01-09 ENCOUNTER — Ambulatory Visit (INDEPENDENT_AMBULATORY_CARE_PROVIDER_SITE_OTHER): Payer: Medicaid Other | Admitting: Medical

## 2019-01-09 VITALS — BP 129/81 | HR 94 | Wt 251.3 lb

## 2019-01-09 DIAGNOSIS — O10919 Unspecified pre-existing hypertension complicating pregnancy, unspecified trimester: Secondary | ICD-10-CM

## 2019-01-09 DIAGNOSIS — O99213 Obesity complicating pregnancy, third trimester: Secondary | ICD-10-CM

## 2019-01-09 DIAGNOSIS — O0990 Supervision of high risk pregnancy, unspecified, unspecified trimester: Secondary | ICD-10-CM

## 2019-01-09 DIAGNOSIS — O10913 Unspecified pre-existing hypertension complicating pregnancy, third trimester: Secondary | ICD-10-CM

## 2019-01-09 DIAGNOSIS — O360923 Maternal care for other rhesus isoimmunization, second trimester, fetus 3: Secondary | ICD-10-CM

## 2019-01-09 DIAGNOSIS — Z3A30 30 weeks gestation of pregnancy: Secondary | ICD-10-CM

## 2019-01-09 DIAGNOSIS — O99210 Obesity complicating pregnancy, unspecified trimester: Secondary | ICD-10-CM

## 2019-01-09 DIAGNOSIS — Z6791 Unspecified blood type, Rh negative: Secondary | ICD-10-CM

## 2019-01-09 DIAGNOSIS — O26899 Other specified pregnancy related conditions, unspecified trimester: Secondary | ICD-10-CM
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
## 2019-01-09 NOTE — Patient Instructions (Signed)
Fetal Movement Counts Patient Name: ________________________________________________ Patient Due Date: ____________________ What is a fetal movement count?  A fetal movement count is the number of times that you feel your baby move during a certain amount of time. This may also be called a fetal kick count. A fetal movement count is recommended for every pregnant woman. You may be asked to start counting fetal movements as early as week 28 of your pregnancy. Pay attention to when your baby is most active. You may notice your baby's sleep and wake cycles. You may also notice things that make your baby move more. You should do a fetal movement count:  When your baby is normally most active.  At the same time each day. A good time to count movements is while you are resting, after having something to eat and drink. How do I count fetal movements? 1. Find a quiet, comfortable area. Sit, or lie down on your side. 2. Write down the date, the start time and stop time, and the number of movements that you felt between those two times. Take this information with you to your health care visits. 3. For 2 hours, count kicks, flutters, swishes, rolls, and jabs. You should feel at least 10 movements during 2 hours. 4. You may stop counting after you have felt 10 movements. 5. If you do not feel 10 movements in 2 hours, have something to eat and drink. Then, keep resting and counting for 1 hour. If you feel at least 4 movements during that hour, you may stop counting. Contact a health care provider if:  You feel fewer than 4 movements in 2 hours.  Your baby is not moving like he or she usually does. Date: ____________ Start time: ____________ Stop time: ____________ Movements: ____________ Date: ____________ Start time: ____________ Stop time: ____________ Movements: ____________ Date: ____________ Start time: ____________ Stop time: ____________ Movements: ____________ Date: ____________ Start time:  ____________ Stop time: ____________ Movements: ____________ Date: ____________ Start time: ____________ Stop time: ____________ Movements: ____________ Date: ____________ Start time: ____________ Stop time: ____________ Movements: ____________ Date: ____________ Start time: ____________ Stop time: ____________ Movements: ____________ Date: ____________ Start time: ____________ Stop time: ____________ Movements: ____________ Date: ____________ Start time: ____________ Stop time: ____________ Movements: ____________ This information is not intended to replace advice given to you by your health care provider. Make sure you discuss any questions you have with your health care provider. Document Released: 11/14/2006 Document Revised: 06/13/2016 Document Reviewed: 11/24/2015 Elsevier Interactive Patient Education  2019 Else[employer] Inc. Braxton Hicks Contractions Contractions of the uterus can occur throughout pregnancy, but they are not always a sign that you are in labor. You may have practice contractions called Braxton Hicks contractions. These false labor contractions are sometimes confused with true labor. What are Braxton Hicks contractions? Braxton Hicks contractions are tightening movements that occur in the muscles of the uterus before labor. Unlike true labor contractions, these contractions do not result in opening (dilation) and thinning of the cervix. Toward the end of pregnancy (32-34 weeks), Braxton Hicks contractions can happen more often and may become stronger. These contractions are sometimes difficult to tell apart from true labor because they can be very uncomfortable. You should not feel embarrassed if you go to the hospital with false labor. Sometimes, the only way to tell if you are in true labor is for your health care provider to look for changes in the cervix. The health care provider will do a physical exam and may monitor your contractions. If   you are not in true labor, the exam  should show that your cervix is not dilating and your water has not broken. If there are no other health problems associated with your pregnancy, it is completely safe for you to be sent home with false labor. You may continue to have Braxton Hicks contractions until you go into true labor. How to tell the difference between true labor and false labor True labor  Contractions last 30-70 seconds.  Contractions become very regular.  Discomfort is usually felt in the top of the uterus, and it spreads to the lower abdomen and low back.  Contractions do not go away with walking.  Contractions usually become more intense and increase in frequency.  The cervix dilates and gets thinner. False labor  Contractions are usually shorter and not as strong as true labor contractions.  Contractions are usually irregular.  Contractions are often felt in the front of the lower abdomen and in the groin.  Contractions may go away when you walk around or change positions while lying down.  Contractions get weaker and are shorter-lasting as time goes on.  The cervix usually does not dilate or become thin. Follow these instructions at home:   Take over-the-counter and prescription medicines only as told by your health care provider.  Keep up with your usual exercises and follow other instructions from your health care provider.  Eat and drink lightly if you think you are going into labor.  If Braxton Hicks contractions are making you uncomfortable: ? Change your position from lying down or resting to walking, or change from walking to resting. ? Sit and rest in a tub of warm water. ? Drink enough fluid to keep your urine pale yellow. Dehydration may cause these contractions. ? Do slow and deep breathing several times an hour.  Keep all follow-up prenatal visits as told by your health care provider. This is important. Contact a health care provider if:  You have a fever.  You have continuous  pain in your abdomen. Get help right away if:  Your contractions become stronger, more regular, and closer together.  You have fluid leaking or gushing from your vagina.  You pass blood-tinged mucus (bloody show).  You have bleeding from your vagina.  You have low back pain that you never had before.  You feel your baby's head pushing down and causing pelvic pressure.  Your baby is not moving inside you as much as it used to. Summary  Contractions that occur before labor are called Braxton Hicks contractions, false labor, or practice contractions.  Braxton Hicks contractions are usually shorter, weaker, farther apart, and less regular than true labor contractions. True labor contractions usually become progressively stronger and regular, and they become more frequent.  Manage discomfort from Braxton Hicks contractions by changing position, resting in a warm bath, drinking plenty of water, or practicing deep breathing. This information is not intended to replace advice given to you by your health care provider. Make sure you discuss any questions you have with your health care provider. Document Released: 02/28/2017 Document Revised: 07/30/2017 Document Reviewed: 02/28/2017 Elsevier Interactive Patient Education  2019 Elsevier Inc.  

## 2019-01-16 ENCOUNTER — Telehealth: Payer: Self-pay

## 2019-01-16 NOTE — Telephone Encounter (Signed)
Returned call and answered questions/concerns about the corona virus.

## 2019-01-26 ENCOUNTER — Ambulatory Visit (INDEPENDENT_AMBULATORY_CARE_PROVIDER_SITE_OTHER): Payer: Medicaid Other | Admitting: Obstetrics and Gynecology

## 2019-01-26 ENCOUNTER — Other Ambulatory Visit: Payer: Self-pay

## 2019-01-26 ENCOUNTER — Encounter: Payer: Self-pay | Admitting: Obstetrics and Gynecology

## 2019-01-26 DIAGNOSIS — O10913 Unspecified pre-existing hypertension complicating pregnancy, third trimester: Secondary | ICD-10-CM

## 2019-01-26 DIAGNOSIS — O360930 Maternal care for other rhesus isoimmunization, third trimester, not applicable or unspecified: Secondary | ICD-10-CM | POA: Diagnosis not present

## 2019-01-26 DIAGNOSIS — Z6791 Unspecified blood type, Rh negative: Secondary | ICD-10-CM

## 2019-01-26 DIAGNOSIS — O26893 Other specified pregnancy related conditions, third trimester: Secondary | ICD-10-CM

## 2019-01-26 DIAGNOSIS — O26899 Other specified pregnancy related conditions, unspecified trimester: Secondary | ICD-10-CM

## 2019-01-26 DIAGNOSIS — O0990 Supervision of high risk pregnancy, unspecified, unspecified trimester: Secondary | ICD-10-CM

## 2019-01-26 DIAGNOSIS — O10919 Unspecified pre-existing hypertension complicating pregnancy, unspecified trimester: Secondary | ICD-10-CM

## 2019-01-26 DIAGNOSIS — Z3A32 32 weeks gestation of pregnancy: Secondary | ICD-10-CM

## 2019-01-26 MED ORDER — CYCLOBENZAPRINE HCL 10 MG PO TABS: 10.0000 mg | ORAL_TABLET | Freq: Three times a day (TID) | ORAL | 2 refills | Status: DC | PRN

## 2019-01-26 NOTE — Progress Notes (Signed)
Pt states that she out of refill on Flexeril and request refills. Pt states she is doing well otherwise.  Pt made aware of [employer] program and how it works. Pt advised of taking BP and weight and entering into [employer].  Pt advised she may pick up BP cuff here at office. Pt made aware to call office with any questions or concerns.

## 2019-01-26 NOTE — Addendum Note (Signed)
Addended by: Catalina Antigua on: 01/26/2019 10:55 AM   Modules accepted: Orders
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
## 2019-01-27 ENCOUNTER — Ambulatory Visit (HOSPITAL_COMMUNITY): Payer: Self-pay

## 2019-01-27 ENCOUNTER — Ambulatory Visit (HOSPITAL_COMMUNITY): Payer: Medicaid Other

## 2019-02-02 ENCOUNTER — Telehealth: Payer: Self-pay | Admitting: *Deleted

## 2019-02-02 NOTE — Telephone Encounter (Signed)
Placed call to pt to make her aware that BP cuff is still at front desk for her to pick up.  Pt states that she will have to get sitter in order to come here to pick up. Pt states she was able to get other BP cuff and has tried to use with [facility].  Pt states she is having a little trouble getting info entered.  Pt advised to try to contact [facility] to troubleshoot and if no luck to call office with BP reading.

## 2019-02-03 ENCOUNTER — Telehealth: Payer: Self-pay

## 2019-02-03 NOTE — Telephone Encounter (Signed)
Patient called back after taking BP medication and reports her BP is 131/84. I advised patient that this is an acceptable BP and should she have high BP again and is symptomatic that she should go to the hospital to be evaluated, pt verbalized understanding.

## 2019-02-03 NOTE — Telephone Encounter (Signed)
Rcvd a call from [facility] stating this patients BP last night was 155/107. I called the patient who stated that she had that reading last night and she has had a headache for a couple days. I had patient take her BP at home over the phone and she states it is now 132/96 this morning, she has not taken her labetalol yet today. I advised patient to take her labetalol and recheck her BP about 45 mins to 1 hour after taking and to call me back with the reading. Pt verbalized understanding.

## 2019-02-05 ENCOUNTER — Telehealth: Payer: Self-pay

## 2019-02-05 NOTE — Telephone Encounter (Signed)
[facility] called stating pts BP was 133/94, retake was 130/94 last night. I called patient to check in, she states she has not taken her BP yet this morning. Pt is not symptomatic other than a slight headache that has been ongoing for a couple weeks, reports good fetal movement. I advised pt to call back when she takes her medication (labetalol) and checks her BP and let me know what her BP is. Pt verbalized understanding.

## 2019-02-05 NOTE — Telephone Encounter (Signed)
Pt called to let me know her BP is 122/83 one hour after taking her BP medication.

## 2019-02-07 ENCOUNTER — Other Ambulatory Visit: Payer: Self-pay

## 2019-02-07 ENCOUNTER — Inpatient Hospital Stay (HOSPITAL_COMMUNITY)
Admission: AD | Admit: 2019-02-07 | Discharge: 2019-02-08 | Disposition: A | Discharge status: Home / Self Care | Payer: Medicaid Other | Attending: Family Medicine | Admitting: Family Medicine

## 2019-02-07 ENCOUNTER — Encounter (HOSPITAL_COMMUNITY): Payer: Self-pay | Admitting: Advanced Practice Midwife

## 2019-02-07 DIAGNOSIS — F339 Major depressive disorder, recurrent, unspecified: Secondary | ICD-10-CM | POA: Diagnosis not present

## 2019-02-07 DIAGNOSIS — O9989 Other specified diseases and conditions complicating pregnancy, childbirth and the puerperium: Secondary | ICD-10-CM | POA: Insufficient documentation

## 2019-02-07 DIAGNOSIS — O26893 Other specified pregnancy related conditions, third trimester: Secondary | ICD-10-CM | POA: Diagnosis not present

## 2019-02-07 DIAGNOSIS — O26899 Other specified pregnancy related conditions, unspecified trimester: Secondary | ICD-10-CM

## 2019-02-07 DIAGNOSIS — O09299 Supervision of pregnancy with other poor reproductive or obstetric history, unspecified trimester: Secondary | ICD-10-CM

## 2019-02-07 DIAGNOSIS — Z87891 Personal history of nicotine dependence: Secondary | ICD-10-CM | POA: Insufficient documentation

## 2019-02-07 DIAGNOSIS — E669 Obesity, unspecified: Secondary | ICD-10-CM | POA: Diagnosis not present

## 2019-02-07 DIAGNOSIS — Z3A34 34 weeks gestation of pregnancy: Secondary | ICD-10-CM | POA: Diagnosis not present

## 2019-02-07 DIAGNOSIS — O99213 Obesity complicating pregnancy, third trimester: Secondary | ICD-10-CM | POA: Insufficient documentation

## 2019-02-07 DIAGNOSIS — O368131 Decreased fetal movements, third trimester, fetus 1: Secondary | ICD-10-CM | POA: Insufficient documentation

## 2019-02-07 DIAGNOSIS — R51 Headache: Secondary | ICD-10-CM | POA: Insufficient documentation

## 2019-02-07 DIAGNOSIS — O99343 Other mental disorders complicating pregnancy, third trimester: Secondary | ICD-10-CM | POA: Insufficient documentation

## 2019-02-07 DIAGNOSIS — Z6791 Unspecified blood type, Rh negative: Secondary | ICD-10-CM

## 2019-02-07 DIAGNOSIS — O10913 Unspecified pre-existing hypertension complicating pregnancy, third trimester: Secondary | ICD-10-CM | POA: Insufficient documentation

## 2019-02-07 LAB — URINALYSIS, ROUTINE W REFLEX MICROSCOPIC
Bilirubin Urine: NEGATIVE
Glucose, UA: NEGATIVE mg/dL
Hgb urine dipstick: NEGATIVE
Ketones, ur: NEGATIVE mg/dL
Nitrite: NEGATIVE
Protein, ur: NEGATIVE mg/dL
Specific Gravity, Urine: 1.019 (ref 1.005–1.030)
WBC, UA: >50 WBC/hpf — ABNORMAL HIGH (ref 0–5)
pH: 6 (ref 5.0–8.0)

## 2019-02-07 LAB — COMPREHENSIVE METABOLIC PANEL WITH GFR
ALT: 11 U/L (ref 0–44)
AST: 15 U/L (ref 15–41)
Albumin: 2.9 g/dL — ABNORMAL LOW (ref 3.5–5.0)
Alkaline Phosphatase: 88 U/L (ref 38–126)
Anion gap: 9 (ref 5–15)
BUN: 7 mg/dL (ref 6–20)
CO2: 19 mmol/L — ABNORMAL LOW (ref 22–32)
Calcium: 9.2 mg/dL (ref 8.9–10.3)
Chloride: 107 mmol/L (ref 98–111)
Creatinine, Ser: 0.6 mg/dL (ref 0.44–1.00)
GFR calc Af Amer: >60 mL/min
GFR calc non Af Amer: >60 mL/min
Glucose, Bld: 103 mg/dL — ABNORMAL HIGH (ref 70–99)
Potassium: 3.8 mmol/L (ref 3.5–5.1)
Sodium: 135 mmol/L (ref 135–145)
Total Bilirubin: 0.5 mg/dL (ref 0.3–1.2)
Total Protein: 6.1 g/dL — ABNORMAL LOW (ref 6.5–8.1)

## 2019-02-07 LAB — CBC
HCT: 34.5 % — ABNORMAL LOW (ref 36.0–46.0)
Hemoglobin: 11.2 g/dL — ABNORMAL LOW (ref 12.0–15.0)
MCH: 27.4 pg (ref 26.0–34.0)
MCHC: 32.5 g/dL (ref 30.0–36.0)
MCV: 84.4 fL (ref 80.0–100.0)
Platelets: 201 K/uL (ref 150–400)
RBC: 4.09 MIL/uL (ref 3.87–5.11)
RDW: 13.5 % (ref 11.5–15.5)
WBC: 11.1 K/uL — ABNORMAL HIGH (ref 4.0–10.5)
nRBC: 0 % (ref 0.0–0.2)

## 2019-02-07 MED ORDER — BUTALBITAL-APAP-CAFFEINE 50-325-40 MG PO TABS
2.0000 | ORAL_TABLET | Freq: Once | ORAL | Status: AC
  Administered 2019-02-07: 2 via ORAL
  Filled 2019-02-07: qty 2
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
## 2019-02-07 NOTE — MAU Note (Addendum)
Have had h/a on R side for 2 wks. Pain mostl behind R eye and worse when I lay down. Tylenol helps but does not totally go away.. Baby not moving as much as usual today. Denies vag bleeding. Come clear mucous d/c

## 2019-02-08 DIAGNOSIS — O10913 Unspecified pre-existing hypertension complicating pregnancy, third trimester: Secondary | ICD-10-CM

## 2019-02-08 DIAGNOSIS — O368131 Decreased fetal movements, third trimester, fetus 1: Secondary | ICD-10-CM

## 2019-02-08 DIAGNOSIS — R51 Headache: Secondary | ICD-10-CM

## 2019-02-08 DIAGNOSIS — O26893 Other specified pregnancy related conditions, third trimester: Secondary | ICD-10-CM | POA: Diagnosis not present

## 2019-02-08 LAB — PROTEIN / CREATININE RATIO, URINE
Creatinine, Urine: 138.21 mg/dL
Protein Creatinine Ratio: 0.1 mg/mg{Cre} (ref 0.00–0.15)
Total Protein, Urine: 14 mg/dL

## 2019-02-08 MED ORDER — BUTALBITAL-APAP-CAFFEINE 50-325-40 MG PO TABS: 1.0000 | ORAL_TABLET | Freq: Four times a day (QID) | ORAL | 0 refills | Status: DC | PRN

## 2019-02-08 NOTE — Progress Notes (Signed)
V. Smith CNM in to discuss test results and d/c plan. Written and verbal d/c instructions given and understanding voiced. 

## 2019-02-08 NOTE — Discharge Instructions (Signed)
General Headache Without Cause A headache is pain or discomfort felt around the head or neck area. The specific cause of a headache may not be found. There are many causes and types of headaches. A few common ones are:  Tension headaches.  Migraine headaches.  Cluster headaches.  Chronic daily headaches. Follow these instructions at home: Watch your condition for any changes. Let your health care provider know about them. Take these steps to help with your condition: Managing pain      Take over-the-counter and prescription medicines only as told by your health care provider.  Lie down in a dark, quiet room when you have a headache.  If directed, put ice on your head and neck area: ? Put ice in a plastic bag. ? Place a towel between your skin and the bag. ? Leave the ice on for 20 minutes, 2-3 times per day.  If directed, apply heat to the affected area. Use the heat source that your health care provider recommends, such as a moist heat pack or a heating pad. ? Place a towel between your skin and the heat source. ? Leave the heat on for 20-30 minutes. ? Remove the heat if your skin turns bright red. This is especially important if you are unable to feel pain, heat, or cold. You may have a greater risk of getting burned.  Keep lights dim if bright lights bother you or make your headaches worse. Eating and drinking  Eat meals on a regular schedule.  If you drink alcohol: ? Limit how much you use to:  0-1 drink a day for women.  0-2 drinks a day for men. ? Be aware of how much alcohol is in your drink. In the [place] one drink equals one 12 oz bottle of beer (355 mL), one 5 oz glass of wine (148 mL), or one 1 oz glass of hard liquor (44 mL).  Stop drinking caffeine, or decrease the amount of caffeine you drink. General instructions   Keep a headache journal to help find out what may trigger your headaches. For example, write down: ? What you eat and drink. ? How much  sleep you get. ? Any change to your diet or medicines.  Try massage or other relaxation techniques.  Limit stress.  Sit up straight, and do not tense your muscles.  Do not use any products that contain nicotine or tobacco, such as cigarettes, e-cigarettes, and chewing tobacco. If you need help quitting, ask your health care provider.  Exercise regularly as told by your health care provider.  Sleep on a regular schedule. Get 7-9 hours of sleep each night, or the amount recommended by your health care provider.  Keep all follow-up visits as told by your health care provider. This is important. Contact a health care provider if:  Your symptoms are not helped by medicine.  You have a headache that is different from the usual headache.  You have nausea or you vomit.  You have a fever. Get help right away if:  Your headache becomes severe quickly.  Your headache gets worse after moderate to intense physical activity.  You have repeated vomiting.  You have a stiff neck.  You have a loss of vision.  You have problems with speech.  You have pain in the eye or ear.  You have muscular weakness or loss of muscle control.  You lose your balance or have trouble walking.  You feel faint or pass out.  You have confusion.  You have a seizure. Summary  A headache is pain or discomfort felt around the head or neck area.  There are many causes and types of headaches. In some cases, the cause may not be found.  Keep a headache journal to help find out what may trigger your headaches. Watch your condition for any changes. Let your health care provider know about them.  Contact a health care provider if you have a headache that is different from the usual headache, or if your symptoms are not helped by medicine.  Get help right away if your headache becomes severe, you vomit, you have a loss of vision, you lose your balance, or you have a seizure. This information is not  intended to replace advice given to you by your health care provider. Make sure you discuss any questions you have with your health care provider. Document Released: 10/15/2005 Document Revised: 05/05/2018 Document Reviewed: 05/05/2018 Elsevier Interactive Patient Education  2019 [employer] Inc[employer]   Preeclampsia and Eclampsia  Preeclampsia is a serious condition that may develop during pregnancy. It is also called toxemia of pregnancy. This condition causes high blood pressure along with other symptoms, such as swelling and headaches. These symptoms may develop as the condition gets worse. Preeclampsia may occur at 20 weeks of pregnancy or later. Diagnosing and treating preeclampsia early is very important. If not treated early, it can cause serious problems for you and your baby. One problem it can lead to is eclampsia. Eclampsia is a condition that causes muscle jerking or shaking (convulsions or seizures) and other serious problems for the mother. During pregnancy, delivering your baby may be the best treatment for preeclampsia or eclampsia. For most women, preeclampsia and eclampsia symptoms go away after giving birth. In rare cases, a woman may develop preeclampsia after giving birth (postpartum preeclampsia). This usually occurs within 48 hours after childbirth but may occur up to 6 weeks after giving birth. What are the causes? The cause of preeclampsia is not known. What increases the risk? The following risk factors make you more likely to develop preeclampsia:  Being pregnant for the first time.  Having had preeclampsia during a past pregnancy.  Having a family history of preeclampsia.  Having high blood pressure.  Being pregnant with more than one baby.  Being 7 or older.  Being African-American.  Having kidney disease or diabetes.  Having medical conditions such as lupus or blood diseases.  Being very overweight (obese). What are the signs or symptoms? The earliest signs  of preeclampsia are:  High blood pressure.  Increased protein in your urine. Your health care provider will check for this at every visit before you give birth (prenatal visit). Other symptoms that may develop as the condition gets worse include:  Severe headaches.  Sudden weight gain.  Swelling of the hands, face, legs, and feet.  Nausea and vomiting.  Vision problems, such as blurred or double vision.  Numbness in the face, arms, legs, and feet.  Urinating less than usual.  Dizziness.  Slurred speech.  Abdominal pain, especially upper abdominal pain.  Convulsions or seizures. How is this diagnosed? There are no screening tests for preeclampsia. Your health care provider will ask you about symptoms and check for signs of preeclampsia during your prenatal visits. You may also have tests that include:  Urine tests.  Blood tests.  Checking your blood pressure.  Monitoring your babys heart rate.  Ultrasound. How is this treated? You and your health care provider will determine the treatment  approach that is best for you. Treatment may include:  Having more frequent prenatal exams to check for signs of preeclampsia, if you have an increased risk for preeclampsia.  Medicine to lower your blood pressure.  Staying in the hospital, if your condition is severe. There, treatment will focus on controlling your blood pressure and the amount of fluids in your body (fluid retention).  Taking medicine (magnesium sulfate) to prevent seizures. This may be given as an injection or through an IV.  Taking a low-dose aspirin during your pregnancy.  Delivering your baby early, if your condition gets worse. You may have your labor started with medicine (induced), or you may have a cesarean delivery. Follow these instructions at home: Eating and drinking   Drink enough fluid to keep your urine pale yellow.  Avoid caffeine. Lifestyle  Do not use any products that contain  nicotine or tobacco, such as cigarettes and e-cigarettes. If you need help quitting, ask your health care provider.  Do not use alcohol or drugs.  Avoid stress as much as possible. Rest and get plenty of sleep. General instructions  Take over-the-counter and prescription medicines only as told by your health care provider.  When lying down, lie on your left side. This keeps pressure off your major blood vessels.  When sitting or lying down, raise (elevate) your feet. Try putting some pillows underneath your lower legs.  Exercise regularly. Ask your health care provider what kinds of exercise are best for you.  Keep all follow-up and prenatal visits as told by your health care provider. This is important. How is this prevented? There is no known way of preventing preeclampsia or eclampsia from developing. However, to lower your risk of complications and detect problems early:  Get regular prenatal care. Your health care provider may be able to diagnose and treat the condition early.  Maintain a healthy weight. Ask your health care provider for help managing weight gain during pregnancy.  Work with your health care provider to manage any long-term (chronic) health conditions you have, such as diabetes or kidney problems.  You may have tests of your blood pressure and kidney function after giving birth.  Your health care provider may have you take low-dose aspirin during your next pregnancy. Contact a health care provider if:  You have symptoms that your health care provider told you may require more treatment or monitoring, such as: ? Headaches. ? Nausea or vomiting. ? Abdominal pain. ? Dizziness. ? Light-headedness. Get help right away if:  You have severe: ? Abdominal pain. ? Headaches that do not get better. ? Dizziness. ? Vision problems. ? Confusion. ? Nausea or vomiting.  You have any of the following: ? A seizure. ? Sudden, rapid weight gain. ? Sudden swelling in  your hands, ankles, or face. ? Trouble moving any part of your body. ? Numbness in any part of your body. ? Trouble speaking. ? Abnormal bleeding.  You faint. Summary  Preeclampsia is a serious condition that may develop during pregnancy. It is also called toxemia of pregnancy.  This condition causes high blood pressure along with other symptoms, such as swelling and headaches.  Diagnosing and treating preeclampsia early is very important. If not treated early, it can cause serious problems for you and your baby.  Get help right away if you have symptoms that your health care provider told you to watch for. This information is not intended to replace advice given to you by your health care provider. Make sure  you discuss any questions you have with your health care provider. Document Released: 10/12/2000 Document Revised: 10/01/2017 Document Reviewed: 05/21/2016 Elsevier Interactive Patient Education  2019 [employer]

## 2019-02-10 ENCOUNTER — Other Ambulatory Visit: Payer: Self-pay

## 2019-02-10 ENCOUNTER — Encounter (HOSPITAL_COMMUNITY): Payer: Self-pay

## 2019-02-10 ENCOUNTER — Ambulatory Visit (HOSPITAL_COMMUNITY): Payer: Medicaid Other | Admitting: *Deleted

## 2019-02-10 ENCOUNTER — Ambulatory Visit (HOSPITAL_COMMUNITY)
Admission: RE | Admit: 2019-02-10 | Discharge: 2019-02-10 | Disposition: A | Discharge status: Home / Self Care | Payer: Medicaid Other | Source: Ambulatory Visit | Attending: Maternal & Fetal Medicine | Admitting: Maternal & Fetal Medicine

## 2019-02-10 ENCOUNTER — Other Ambulatory Visit (HOSPITAL_COMMUNITY): Payer: Self-pay | Admitting: *Deleted

## 2019-02-10 ENCOUNTER — Ambulatory Visit (INDEPENDENT_AMBULATORY_CARE_PROVIDER_SITE_OTHER): Payer: Medicaid Other | Admitting: Obstetrics & Gynecology

## 2019-02-10 VITALS — BP 123/85 | HR 101 | Temp 98.0°F

## 2019-02-10 VITALS — Wt 257.0 lb

## 2019-02-10 DIAGNOSIS — O10919 Unspecified pre-existing hypertension complicating pregnancy, unspecified trimester: Secondary | ICD-10-CM

## 2019-02-10 DIAGNOSIS — O10913 Unspecified pre-existing hypertension complicating pregnancy, third trimester: Secondary | ICD-10-CM | POA: Diagnosis not present

## 2019-02-10 DIAGNOSIS — Z3A34 34 weeks gestation of pregnancy: Secondary | ICD-10-CM

## 2019-02-10 DIAGNOSIS — O10013 Pre-existing essential hypertension complicating pregnancy, third trimester: Secondary | ICD-10-CM

## 2019-02-10 DIAGNOSIS — O09293 Supervision of pregnancy with other poor reproductive or obstetric history, third trimester: Secondary | ICD-10-CM

## 2019-02-10 DIAGNOSIS — O99210 Obesity complicating pregnancy, unspecified trimester: Secondary | ICD-10-CM

## 2019-02-10 DIAGNOSIS — Z362 Encounter for other antenatal screening follow-up: Secondary | ICD-10-CM

## 2019-02-10 DIAGNOSIS — O99213 Obesity complicating pregnancy, third trimester: Secondary | ICD-10-CM

## 2019-02-10 DIAGNOSIS — O0990 Supervision of high risk pregnancy, unspecified, unspecified trimester: Secondary | ICD-10-CM
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
## 2019-02-17 ENCOUNTER — Inpatient Hospital Stay (HOSPITAL_COMMUNITY)
Admission: AD | Admit: 2019-02-17 | Discharge: 2019-02-17 | Disposition: A | Discharge status: Home / Self Care | Payer: Medicaid Other | Attending: Obstetrics and Gynecology | Admitting: Obstetrics and Gynecology

## 2019-02-17 ENCOUNTER — Ambulatory Visit (INDEPENDENT_AMBULATORY_CARE_PROVIDER_SITE_OTHER): Payer: Medicaid Other | Admitting: Obstetrics

## 2019-02-17 ENCOUNTER — Other Ambulatory Visit: Payer: Self-pay

## 2019-02-17 ENCOUNTER — Encounter: Payer: Self-pay | Admitting: Obstetrics

## 2019-02-17 ENCOUNTER — Encounter (HOSPITAL_COMMUNITY): Payer: Self-pay

## 2019-02-17 DIAGNOSIS — O10919 Unspecified pre-existing hypertension complicating pregnancy, unspecified trimester: Secondary | ICD-10-CM

## 2019-02-17 DIAGNOSIS — O26899 Other specified pregnancy related conditions, unspecified trimester: Secondary | ICD-10-CM

## 2019-02-17 DIAGNOSIS — Z6791 Unspecified blood type, Rh negative: Secondary | ICD-10-CM

## 2019-02-17 DIAGNOSIS — O09293 Supervision of pregnancy with other poor reproductive or obstetric history, third trimester: Secondary | ICD-10-CM | POA: Diagnosis not present

## 2019-02-17 DIAGNOSIS — O360130 Maternal care for anti-D [Rh] antibodies, third trimester, not applicable or unspecified: Secondary | ICD-10-CM

## 2019-02-17 DIAGNOSIS — O0993 Supervision of high risk pregnancy, unspecified, third trimester: Secondary | ICD-10-CM

## 2019-02-17 DIAGNOSIS — Z3A35 35 weeks gestation of pregnancy: Secondary | ICD-10-CM | POA: Diagnosis not present

## 2019-02-17 DIAGNOSIS — O0990 Supervision of high risk pregnancy, unspecified, unspecified trimester: Secondary | ICD-10-CM

## 2019-02-17 DIAGNOSIS — Z3689 Encounter for other specified antenatal screening: Secondary | ICD-10-CM

## 2019-02-17 DIAGNOSIS — O10913 Unspecified pre-existing hypertension complicating pregnancy, third trimester: Secondary | ICD-10-CM

## 2019-02-17 DIAGNOSIS — O09299 Supervision of pregnancy with other poor reproductive or obstetric history, unspecified trimester: Secondary | ICD-10-CM

## 2019-02-17 DIAGNOSIS — Z0371 Encounter for suspected problem with amniotic cavity and membrane ruled out: Secondary | ICD-10-CM

## 2019-02-17 HISTORY — DX: Methicillin resistant Staphylococcus aureus infection, unspecified site: A49.02

## 2019-02-17 LAB — URINALYSIS, ROUTINE W REFLEX MICROSCOPIC
Bilirubin Urine: NEGATIVE
Glucose, UA: NEGATIVE mg/dL
Hgb urine dipstick: NEGATIVE
Ketones, ur: NEGATIVE mg/dL
Nitrite: NEGATIVE
Protein, ur: 30 mg/dL — AB
Specific Gravity, Urine: 1.015 (ref 1.005–1.030)
Squamous Epithelial / HPF: >50 — ABNORMAL HIGH (ref 0–5)
pH: 5 (ref 5.0–8.0)

## 2019-02-17 LAB — WET PREP, GENITAL
Clue Cells Wet Prep HPF POC: NONE SEEN
Sperm: NONE SEEN
Trich, Wet Prep: NONE SEEN

## 2019-02-17 NOTE — MAU Provider Note (Signed)
First Provider Initiated Contact with Patient 02/17/19 1100     S: [name] is a 23 y.o. G3P2002 at [redacted]w[redacted]d  who presents to MAU today complaining of a large gush of fluid yesterday morning. She denies ongoing leaking of fluid since that time. She denies vaginal bleeding. She denies abdominal contractions but reports occasional Braxton Hicks contractions.  She reports normal fetal movement.    Patient's pregnancy is complicated by Chronic Hypertension. She denies headache, upper abdominal pain, visual disturbances, and new onset swelling or weight gain today.  O: BP 116/78   Pulse 97   Temp 98.5 F (36.9 C) (Oral)   Resp 16   Wt 116.5 kg   LMP 06/13/2018 (Exact Date)   SpO2 100%   BMI 44.10 kg/m  GENERAL: Well-developed, well-nourished female in no acute distress.  HEAD: Normocephalic, atraumatic.  CHEST: Normal effort of breathing, regular heart rate ABDOMEN: Soft, nontender, gravid GU: thin white cluster of vaginal discharge visible in vaginal vault  Cervical exam: cervix closed on sterile speculum exam   Fetal Monitoring: Baseline: 130 Variability: moderate Accelerations: positive 15 x 15 Decelerations: none Contractions: uterine irritability   A: SIUP at [redacted]w[redacted]d  Normotensive Negative pooling No gush with valsalva Negative fern No concerning findings on wet prep or UA Intact amniotic sac Reactive tracing  P: Discharge home in stable condition  F/U: [facility] as needed  Calvert Cantor, [place] 02/17/2019 11:45 AM

## 2019-02-17 NOTE — Progress Notes (Signed)
                                                                                                                                                                                                                                                                                                                                                                                                                                                                                                                                                                                                                                                                                                            Pt called to office on 02/16/2019 with pain and ?fluid. Return call to pt.  Pt states she had pressure/pain yesterday and had some loss of fluid during this time. Pt states unsure if bladder or vaginal leaking.  Pt states no leaking/wetness since then.  Pt states cramping and occasional ctx.  Pt then transferred to Dr Clearance Coots as she has telehealth visit and recommendations.

## 2019-02-17 NOTE — Discharge Instructions (Signed)
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
## 2019-02-17 NOTE — MAU Note (Signed)
Pt felt like she had a gush of fluid on Sunday and some vaginal pain. No leaking since then. No bleeding +FM.

## 2019-02-18 ENCOUNTER — Other Ambulatory Visit: Payer: Self-pay

## 2019-02-18 ENCOUNTER — Ambulatory Visit (HOSPITAL_COMMUNITY)
Admission: RE | Admit: 2019-02-18 | Discharge: 2019-02-18 | Disposition: A | Discharge status: Home / Self Care | Payer: Medicaid Other | Source: Ambulatory Visit | Attending: Obstetrics and Gynecology | Admitting: Obstetrics and Gynecology

## 2019-02-18 ENCOUNTER — Ambulatory Visit (HOSPITAL_COMMUNITY): Payer: Medicaid Other | Admitting: *Deleted

## 2019-02-18 ENCOUNTER — Encounter (HOSPITAL_COMMUNITY): Payer: Self-pay

## 2019-02-18 VITALS — BP 125/75 | HR 96 | Temp 98.2°F

## 2019-02-18 DIAGNOSIS — O10013 Pre-existing essential hypertension complicating pregnancy, third trimester: Secondary | ICD-10-CM | POA: Diagnosis not present

## 2019-02-18 DIAGNOSIS — O09293 Supervision of pregnancy with other poor reproductive or obstetric history, third trimester: Secondary | ICD-10-CM

## 2019-02-18 DIAGNOSIS — Z3A35 35 weeks gestation of pregnancy: Secondary | ICD-10-CM | POA: Diagnosis not present

## 2019-02-18 DIAGNOSIS — O99213 Obesity complicating pregnancy, third trimester: Secondary | ICD-10-CM

## 2019-02-18 DIAGNOSIS — Z362 Encounter for other antenatal screening follow-up: Secondary | ICD-10-CM

## 2019-02-18 DIAGNOSIS — O10919 Unspecified pre-existing hypertension complicating pregnancy, unspecified trimester: Secondary | ICD-10-CM | POA: Diagnosis not present

## 2019-02-24 ENCOUNTER — Other Ambulatory Visit (HOSPITAL_COMMUNITY)
Admission: RE | Admit: 2019-02-24 | Discharge: 2019-02-24 | Disposition: A | Discharge status: Home / Self Care | Payer: Medicaid Other | Source: Ambulatory Visit | Attending: Obstetrics | Admitting: Obstetrics

## 2019-02-24 ENCOUNTER — Ambulatory Visit (INDEPENDENT_AMBULATORY_CARE_PROVIDER_SITE_OTHER): Payer: Medicaid Other | Admitting: Obstetrics and Gynecology

## 2019-02-24 ENCOUNTER — Encounter: Payer: Self-pay | Admitting: Obstetrics

## 2019-02-24 ENCOUNTER — Encounter: Payer: Self-pay | Admitting: Obstetrics and Gynecology

## 2019-02-24 ENCOUNTER — Other Ambulatory Visit: Payer: Self-pay

## 2019-02-24 VITALS — BP 122/69 | HR 118 | Temp 97.9°F | Wt 258.0 lb

## 2019-02-24 DIAGNOSIS — O26893 Other specified pregnancy related conditions, third trimester: Secondary | ICD-10-CM

## 2019-02-24 DIAGNOSIS — Z6791 Unspecified blood type, Rh negative: Secondary | ICD-10-CM

## 2019-02-24 DIAGNOSIS — O0990 Supervision of high risk pregnancy, unspecified, unspecified trimester: Secondary | ICD-10-CM | POA: Insufficient documentation

## 2019-02-24 DIAGNOSIS — O360930 Maternal care for other rhesus isoimmunization, third trimester, not applicable or unspecified: Secondary | ICD-10-CM

## 2019-02-24 DIAGNOSIS — O10913 Unspecified pre-existing hypertension complicating pregnancy, third trimester: Secondary | ICD-10-CM

## 2019-02-24 DIAGNOSIS — O0993 Supervision of high risk pregnancy, unspecified, third trimester: Secondary | ICD-10-CM

## 2019-02-24 DIAGNOSIS — Z3A36 36 weeks gestation of pregnancy: Secondary | ICD-10-CM

## 2019-02-24 DIAGNOSIS — O10919 Unspecified pre-existing hypertension complicating pregnancy, unspecified trimester: Secondary | ICD-10-CM

## 2019-02-24 DIAGNOSIS — O26899 Other specified pregnancy related conditions, unspecified trimester: Secondary | ICD-10-CM

## 2019-02-24 LAB — OB RESULTS CONSOLE GC/CHLAMYDIA: Gonorrhea: NEGATIVE

## 2019-02-24 NOTE — Progress Notes (Signed)
Pt denies any visual changes no HA's.  MAU on 02/17/19 for LOF none today.
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
## 2019-02-24 NOTE — Patient Instructions (Signed)
Third Trimester of Pregnancy The third trimester is from week 28 through week 40 (months 7 through 9). The third trimester is a time when the unborn baby (fetus) is growing rapidly. At the end of the ninth month, the fetus is about 20 inches in length and weighs 6-10 pounds. Body changes during your third trimester Your body will continue to go through many changes during pregnancy. The changes vary from woman to woman. During the third trimester:  Your weight will continue to increase. You can expect to gain 25-35 pounds (11-16 kg) by the end of the pregnancy.  You may begin to get stretch marks on your hips, abdomen, and breasts.  You may urinate more often because the fetus is moving lower into your pelvis and pressing on your bladder.  You may develop or continue to have heartburn. This is caused by increased hormones that slow down muscles in the digestive tract.  You may develop or continue to have constipation because increased hormones slow digestion and cause the muscles that push waste through your intestines to relax.  You may develop hemorrhoids. These are swollen veins (varicose veins) in the rectum that can itch or be painful.  You may develop swollen, bulging veins (varicose veins) in your legs.  You may have increased body aches in the pelvis, back, or thighs. This is due to weight gain and increased hormones that are relaxing your joints.  You may have changes in your hair. These can include thickening of your hair, rapid growth, and changes in texture. Some women also have hair loss during or after pregnancy, or hair that feels dry or thin. Your hair will most likely return to normal after your baby is born.  Your breasts will continue to grow and they will continue to become tender. A yellow fluid (colostrum) may leak from your breasts. This is the first milk you are producing for your baby.  Your belly button may stick out.  You may notice more swelling in your hands,  face, or ankles.  You may have increased tingling or numbness in your hands, arms, and legs. The skin on your belly may also feel numb.  You may feel short of breath because of your expanding uterus.  You may have more problems sleeping. This can be caused by the size of your belly, increased need to urinate, and an increase in your body's metabolism.  You may notice the fetus "dropping," or moving lower in your abdomen (lightening).  You may have increased vaginal discharge.  You may notice your joints feel loose and you may have pain around your pelvic bone. What to expect at prenatal visits You will have prenatal exams every 2 weeks until week 36. Then you will have weekly prenatal exams. During a routine prenatal visit:  You will be weighed to make sure you and the baby are growing normally.  Your blood pressure will be taken.  Your abdomen will be measured to track your baby's growth.  The fetal heartbeat will be listened to.  Any test results from the previous visit will be discussed.  You may have a cervical check near your due date to see if your cervix has softened or thinned (effaced).  You will be tested for Group B streptococcus. This happens between 35 and 37 weeks. Your health care provider may ask you:  What your birth plan is.  How you are feeling.  If you are feeling the baby move.  If you have had any abnormal   symptoms, such as leaking fluid, bleeding, severe headaches, or abdominal cramping.  If you are using any tobacco products, including cigarettes, chewing tobacco, and electronic cigarettes.  If you have any questions. Other tests or screenings that may be performed during your third trimester include:  Blood tests that check for low iron levels (anemia).  Fetal testing to check the health, activity level, and growth of the fetus. Testing is done if you have certain medical conditions or if there are problems during the pregnancy.  Nonstress test  (NST). This test checks the health of your baby to make sure there are no signs of problems, such as the baby not getting enough oxygen. During this test, a belt is placed around your belly. The baby is made to move, and its heart rate is monitored during movement. What is false labor? False labor is a condition in which you feel small, irregular tightenings of the muscles in the womb (contractions) that usually go away with rest, changing position, or drinking water. These are called Braxton Hicks contractions. Contractions may last for hours, days, or even weeks before true labor sets in. If contractions come at regular intervals, become more frequent, increase in intensity, or become painful, you should see your health care provider. What are the signs of labor?  Abdominal cramps.  Regular contractions that start at 10 minutes apart and become stronger and more frequent with time.  Contractions that start on the top of the uterus and spread down to the lower abdomen and back.  Increased pelvic pressure and dull back pain.  A watery or bloody mucus discharge that comes from the vagina.  Leaking of amniotic fluid. This is also known as your "water breaking." It could be a slow trickle or a gush. Let your health care provider know if it has a color or strange odor. If you have any of these signs, call your health care provider right away, even if it is before your due date. Follow these instructions at home: Medicines  Follow your health care provider's instructions regarding medicine use. Specific medicines may be either safe or unsafe to take during pregnancy.  Take a prenatal vitamin that contains at least 600 micrograms (mcg) of folic acid.  If you develop constipation, try taking a stool softener if your health care provider approves. Eating and drinking   Eat a balanced diet that includes fresh fruits and vegetables, whole grains, good sources of protein such as meat, eggs, or tofu,  and low-fat dairy. Your health care provider will help you determine the amount of weight gain that is right for you.  Avoid raw meat and uncooked cheese. These carry germs that can cause birth defects in the baby.  If you have low calcium intake from food, talk to your health care provider about whether you should take a daily calcium supplement.  Eat four or five small meals rather than three large meals a day.  Limit foods that are high in fat and processed sugars, such as fried and sweet foods.  To prevent constipation: ? Drink enough fluid to keep your urine clear or pale yellow. ? Eat foods that are high in fiber, such as fresh fruits and vegetables, whole grains, and beans. Activity  Exercise only as directed by your health care provider. Most women can continue their usual exercise routine during pregnancy. Try to exercise for 30 minutes at least 5 days a week. Stop exercising if you experience uterine contractions.  Avoid heavy lifting.  Do   not exercise in extreme heat or humidity, or at high altitudes.  Wear low-heel, comfortable shoes.  Practice good posture.  You may continue to have sex unless your health care provider tells you otherwise. Relieving pain and discomfort  Take frequent breaks and rest with your legs elevated if you have leg cramps or low back pain.  Take warm sitz baths to soothe any pain or discomfort caused by hemorrhoids. Use hemorrhoid cream if your health care provider approves.  Wear a good support bra to prevent discomfort from breast tenderness.  If you develop varicose veins: ? Wear support pantyhose or compression stockings as told by your healthcare provider. ? Elevate your feet for 15 minutes, 3-4 times a day. Prenatal care  Write down your questions. Take them to your prenatal visits.  Keep all your prenatal visits as told by your health care provider. This is important. Safety  Wear your seat belt at all times when driving.  Make  a list of emergency phone numbers, including numbers for family, friends, the hospital, and police and fire departments. General instructions  Avoid cat litter boxes and soil used by cats. These carry germs that can cause birth defects in the baby. If you have a cat, ask someone to clean the litter box for you.  Do not travel far distances unless it is absolutely necessary and only with the approval of your health care provider.  Do not use hot tubs, steam rooms, or saunas.  Do not drink alcohol.  Do not use any products that contain nicotine or tobacco, such as cigarettes and e-cigarettes. If you need help quitting, ask your health care provider.  Do not use any medicinal herbs or unprescribed drugs. These chemicals affect the formation and growth of the baby.  Do not douche or use tampons or scented sanitary pads.  Do not cross your legs for long periods of time.  To prepare for the arrival of your baby: ? Take prenatal classes to understand, practice, and ask questions about labor and delivery. ? Make a trial run to the hospital. ? Visit the hospital and tour the maternity area. ? Arrange for maternity or paternity leave through employers. ? Arrange for family and friends to take care of pets while you are in the hospital. ? Purchase a rear-facing car seat and make sure you know how to install it in your car. ? Pack your hospital bag. ? Prepare the baby's nursery. Make sure to remove all pillows and stuffed animals from the baby's crib to prevent suffocation.  Visit your dentist if you have not gone during your pregnancy. Use a soft toothbrush to brush your teeth and be gentle when you floss. Contact a health care provider if:  You are unsure if you are in labor or if your water has broken.  You become dizzy.  You have mild pelvic cramps, pelvic pressure, or nagging pain in your abdominal area.  You have lower back pain.  You have persistent nausea, vomiting, or  diarrhea.  You have an unusual or bad smelling vaginal discharge.  You have pain when you urinate. Get help right away if:  Your water breaks before 37 weeks.  You have regular contractions less than 5 minutes apart before 37 weeks.  You have a fever.  You are leaking fluid from your vagina.  You have spotting or bleeding from your vagina.  You have severe abdominal pain or cramping.  You have rapid weight loss or weight gain.  You have   shortness of breath with chest pain.  You notice sudden or extreme swelling of your face, hands, ankles, feet, or legs.  Your baby makes fewer than 10 movements in 2 hours.  You have severe headaches that do not go away when you take medicine.  You have vision changes. Summary  The third trimester is from week 28 through week 40, months 7 through 9. The third trimester is a time when the unborn baby (fetus) is growing rapidly.  During the third trimester, your discomfort may increase as you and your baby continue to gain weight. You may have abdominal, leg, and back pain, sleeping problems, and an increased need to urinate.  During the third trimester your breasts will keep growing and they will continue to become tender. A yellow fluid (colostrum) may leak from your breasts. This is the first milk you are producing for your baby.  False labor is a condition in which you feel small, irregular tightenings of the muscles in the womb (contractions) that eventually go away. These are called Braxton Hicks contractions. Contractions may last for hours, days, or even weeks before true labor sets in.  Signs of labor can include: abdominal cramps; regular contractions that start at 10 minutes apart and become stronger and more frequent with time; watery or bloody mucus discharge that comes from the vagina; increased pelvic pressure and dull back pain; and leaking of amniotic fluid. This information is not intended to replace advice given to you by your  health care provider. Make sure you discuss any questions you have with your health care provider. Document Released: 10/09/2001 Document Revised: 11/20/2016 Document Reviewed: 11/20/2016 Elsevier Interactive Patient Education  2019 [employer]  

## 2019-02-25 ENCOUNTER — Encounter: Payer: Self-pay | Admitting: Obstetrics

## 2019-02-25 ENCOUNTER — Ambulatory Visit (HOSPITAL_COMMUNITY): Payer: Medicaid Other | Admitting: *Deleted

## 2019-02-25 ENCOUNTER — Encounter (HOSPITAL_COMMUNITY): Payer: Self-pay

## 2019-02-25 ENCOUNTER — Ambulatory Visit (HOSPITAL_COMMUNITY)
Admission: RE | Admit: 2019-02-25 | Discharge: 2019-02-25 | Disposition: A | Discharge status: Home / Self Care | Payer: Medicaid Other | Source: Ambulatory Visit | Attending: Obstetrics and Gynecology | Admitting: Obstetrics and Gynecology

## 2019-02-25 VITALS — BP 118/77 | HR 105 | Temp 98.4°F

## 2019-02-25 DIAGNOSIS — O99213 Obesity complicating pregnancy, third trimester: Secondary | ICD-10-CM

## 2019-02-25 DIAGNOSIS — Z362 Encounter for other antenatal screening follow-up: Secondary | ICD-10-CM | POA: Diagnosis not present

## 2019-02-25 DIAGNOSIS — O10919 Unspecified pre-existing hypertension complicating pregnancy, unspecified trimester: Secondary | ICD-10-CM | POA: Diagnosis not present

## 2019-02-25 DIAGNOSIS — O09293 Supervision of pregnancy with other poor reproductive or obstetric history, third trimester: Secondary | ICD-10-CM

## 2019-02-25 DIAGNOSIS — Z3A36 36 weeks gestation of pregnancy: Secondary | ICD-10-CM

## 2019-02-25 DIAGNOSIS — O10913 Unspecified pre-existing hypertension complicating pregnancy, third trimester: Secondary | ICD-10-CM

## 2019-02-25 LAB — GC/CHLAMYDIA PROBE AMP (~~LOC~~) NOT AT ARMC
Chlamydia: NEGATIVE
Neisseria Gonorrhea: NEGATIVE

## 2019-02-26 LAB — STREP GP B NAA: Strep Gp B NAA: NEGATIVE

## 2019-03-03 ENCOUNTER — Ambulatory Visit (INDEPENDENT_AMBULATORY_CARE_PROVIDER_SITE_OTHER): Payer: Medicaid Other | Admitting: Obstetrics and Gynecology

## 2019-03-03 ENCOUNTER — Encounter: Payer: Self-pay | Admitting: Obstetrics and Gynecology

## 2019-03-03 ENCOUNTER — Other Ambulatory Visit: Payer: Self-pay

## 2019-03-03 ENCOUNTER — Telehealth (HOSPITAL_COMMUNITY): Payer: Self-pay | Admitting: *Deleted

## 2019-03-03 VITALS — BP 143/101 | HR 105

## 2019-03-03 DIAGNOSIS — O10913 Unspecified pre-existing hypertension complicating pregnancy, third trimester: Secondary | ICD-10-CM

## 2019-03-03 DIAGNOSIS — O0990 Supervision of high risk pregnancy, unspecified, unspecified trimester: Secondary | ICD-10-CM

## 2019-03-03 DIAGNOSIS — O10919 Unspecified pre-existing hypertension complicating pregnancy, unspecified trimester: Secondary | ICD-10-CM

## 2019-03-03 DIAGNOSIS — O0993 Supervision of high risk pregnancy, unspecified, third trimester: Secondary | ICD-10-CM

## 2019-03-03 DIAGNOSIS — Z3A37 37 weeks gestation of pregnancy: Secondary | ICD-10-CM

## 2019-03-03 NOTE — Telephone Encounter (Signed)
Preadmission screen Co vid appt

## 2019-03-03 NOTE — Progress Notes (Signed)
Pt presents for webex visit. Pt identified with two pt identifiers. Pt is [redacted]w[redacted]d. Pt has been checking her bp. BP right now 142/104. I had pt to recheck at the end of our conversation which was 143/101. Pt denies having any headache, swelling, visual disturbances, or epigastric pain. She states that she did take her labetalol and baby aspirin at 9 am. Pt does not have any concerns.

## 2019-03-03 NOTE — Addendum Note (Signed)
Addended by: Hermina Staggers on: 03/03/2019 05:02 PM   Modules accepted: Orders, SmartSet
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
## 2019-03-04 ENCOUNTER — Encounter (HOSPITAL_COMMUNITY): Payer: Self-pay

## 2019-03-04 ENCOUNTER — Ambulatory Visit (HOSPITAL_COMMUNITY)
Admission: RE | Admit: 2019-03-04 | Discharge: 2019-03-04 | Disposition: A | Discharge status: Home / Self Care | Payer: Medicaid Other | Source: Ambulatory Visit | Attending: Obstetrics and Gynecology | Admitting: Obstetrics and Gynecology

## 2019-03-04 ENCOUNTER — Ambulatory Visit (HOSPITAL_COMMUNITY): Payer: Medicaid Other | Admitting: *Deleted

## 2019-03-04 VITALS — BP 122/76 | HR 106 | Temp 98.5°F

## 2019-03-04 DIAGNOSIS — O10013 Pre-existing essential hypertension complicating pregnancy, third trimester: Secondary | ICD-10-CM

## 2019-03-04 DIAGNOSIS — Z3A37 37 weeks gestation of pregnancy: Secondary | ICD-10-CM

## 2019-03-04 DIAGNOSIS — O99213 Obesity complicating pregnancy, third trimester: Secondary | ICD-10-CM

## 2019-03-04 DIAGNOSIS — O10919 Unspecified pre-existing hypertension complicating pregnancy, unspecified trimester: Secondary | ICD-10-CM | POA: Diagnosis not present

## 2019-03-04 DIAGNOSIS — Z362 Encounter for other antenatal screening follow-up: Secondary | ICD-10-CM | POA: Diagnosis not present

## 2019-03-04 DIAGNOSIS — O09293 Supervision of pregnancy with other poor reproductive or obstetric history, third trimester: Secondary | ICD-10-CM | POA: Diagnosis not present

## 2019-03-05 ENCOUNTER — Encounter (HOSPITAL_COMMUNITY): Payer: Self-pay | Admitting: *Deleted

## 2019-03-05 ENCOUNTER — Other Ambulatory Visit (HOSPITAL_COMMUNITY): Payer: Self-pay | Admitting: *Deleted

## 2019-03-06 ENCOUNTER — Other Ambulatory Visit: Payer: Self-pay | Admitting: Advanced Practice Midwife

## 2019-03-06 ENCOUNTER — Ambulatory Visit (INDEPENDENT_AMBULATORY_CARE_PROVIDER_SITE_OTHER): Payer: Medicaid Other

## 2019-03-06 ENCOUNTER — Other Ambulatory Visit: Payer: Self-pay

## 2019-03-06 VITALS — BP 123/80

## 2019-03-06 DIAGNOSIS — O10913 Unspecified pre-existing hypertension complicating pregnancy, third trimester: Secondary | ICD-10-CM

## 2019-03-06 DIAGNOSIS — O10919 Unspecified pre-existing hypertension complicating pregnancy, unspecified trimester: Secondary | ICD-10-CM

## 2019-03-06 NOTE — Progress Notes (Signed)
Subjective:  Tara Blake is a 23 y.o. female televisit for BP check.  Hypertension ROS: taking medications as instructed, no medication side effects noted, no TIA's, no chest pain on exertion, no dyspnea on exertion and no swelling of ankles.    Objective:  LMP 06/13/2018 (Exact Date)   Appearance alert, well appearing, and in no distress. General exam BP noted to be well controlled today in office.    Assessment:   Blood Pressure well controlled.   Plan:  Current treatment plan is effective, no change in therapy.

## 2019-03-06 NOTE — Progress Notes (Signed)
I have reviewed this chart and agree with the RN/CMA assessment and management.    K. Meryl Ophelia Sipe, M.D. Attending [facility] (Faculty Practice)   

## 2019-03-11 ENCOUNTER — Ambulatory Visit (HOSPITAL_COMMUNITY)
Admission: RE | Admit: 2019-03-11 | Discharge: 2019-03-11 | Disposition: A | Discharge status: Home / Self Care | Payer: Medicaid Other | Source: Ambulatory Visit | Attending: Obstetrics and Gynecology | Admitting: Obstetrics and Gynecology

## 2019-03-11 ENCOUNTER — Other Ambulatory Visit: Payer: Self-pay

## 2019-03-11 ENCOUNTER — Ambulatory Visit (HOSPITAL_COMMUNITY): Payer: Medicaid Other | Admitting: *Deleted

## 2019-03-11 ENCOUNTER — Encounter (HOSPITAL_COMMUNITY): Payer: Self-pay

## 2019-03-11 ENCOUNTER — Inpatient Hospital Stay (HOSPITAL_COMMUNITY): Admission: RE | Admit: 2019-03-11 | Payer: Medicaid Other | Source: Ambulatory Visit

## 2019-03-11 VITALS — BP 117/79 | HR 121 | Temp 97.1°F

## 2019-03-11 DIAGNOSIS — O10919 Unspecified pre-existing hypertension complicating pregnancy, unspecified trimester: Secondary | ICD-10-CM | POA: Diagnosis present

## 2019-03-11 DIAGNOSIS — O10013 Pre-existing essential hypertension complicating pregnancy, third trimester: Secondary | ICD-10-CM | POA: Diagnosis not present

## 2019-03-11 DIAGNOSIS — Z362 Encounter for other antenatal screening follow-up: Secondary | ICD-10-CM

## 2019-03-11 DIAGNOSIS — O09293 Supervision of pregnancy with other poor reproductive or obstetric history, third trimester: Secondary | ICD-10-CM

## 2019-03-11 DIAGNOSIS — O10913 Unspecified pre-existing hypertension complicating pregnancy, third trimester: Secondary | ICD-10-CM | POA: Insufficient documentation

## 2019-03-11 DIAGNOSIS — Z1159 Encounter for screening for other viral diseases: Secondary | ICD-10-CM | POA: Insufficient documentation

## 2019-03-11 DIAGNOSIS — Z3A38 38 weeks gestation of pregnancy: Secondary | ICD-10-CM | POA: Diagnosis not present

## 2019-03-11 DIAGNOSIS — O99213 Obesity complicating pregnancy, third trimester: Secondary | ICD-10-CM | POA: Diagnosis not present

## 2019-03-11 NOTE — MAU Note (Signed)
Covid swab collected. Pt tolerated well. Pt asymptomatic 

## 2019-03-12 ENCOUNTER — Other Ambulatory Visit (HOSPITAL_COMMUNITY): Payer: Self-pay | Admitting: *Deleted

## 2019-03-12 LAB — NOVEL CORONAVIRUS, NAA (HOSP ORDER, SEND-OUT TO REF LAB; TAT 18-24 HRS): SARS-CoV-2, NAA: NOT DETECTED

## 2019-03-13 ENCOUNTER — Encounter (HOSPITAL_COMMUNITY): Payer: Self-pay | Admitting: *Deleted

## 2019-03-13 ENCOUNTER — Inpatient Hospital Stay (HOSPITAL_COMMUNITY)
Admission: AD | Admit: 2019-03-13 | Discharge: 2019-03-15 | DRG: 807 | Disposition: A | Discharge status: Home / Self Care | Payer: Medicaid Other | Attending: Obstetrics and Gynecology | Admitting: Obstetrics and Gynecology

## 2019-03-13 ENCOUNTER — Other Ambulatory Visit: Payer: Self-pay

## 2019-03-13 ENCOUNTER — Inpatient Hospital Stay (HOSPITAL_COMMUNITY): Payer: Medicaid Other | Admitting: Anesthesiology

## 2019-03-13 ENCOUNTER — Encounter (HOSPITAL_COMMUNITY): Payer: Medicaid Other

## 2019-03-13 ENCOUNTER — Inpatient Hospital Stay (HOSPITAL_COMMUNITY): Payer: Medicaid Other

## 2019-03-13 DIAGNOSIS — Z6791 Unspecified blood type, Rh negative: Secondary | ICD-10-CM | POA: Diagnosis not present

## 2019-03-13 DIAGNOSIS — E669 Obesity, unspecified: Secondary | ICD-10-CM | POA: Diagnosis present

## 2019-03-13 DIAGNOSIS — O10919 Unspecified pre-existing hypertension complicating pregnancy, unspecified trimester: Secondary | ICD-10-CM | POA: Diagnosis present

## 2019-03-13 DIAGNOSIS — O26893 Other specified pregnancy related conditions, third trimester: Secondary | ICD-10-CM | POA: Diagnosis present

## 2019-03-13 DIAGNOSIS — Z3A39 39 weeks gestation of pregnancy: Secondary | ICD-10-CM | POA: Diagnosis not present

## 2019-03-13 DIAGNOSIS — O09299 Supervision of pregnancy with other poor reproductive or obstetric history, unspecified trimester: Secondary | ICD-10-CM

## 2019-03-13 DIAGNOSIS — Z87891 Personal history of nicotine dependence: Secondary | ICD-10-CM

## 2019-03-13 DIAGNOSIS — O9952 Diseases of the respiratory system complicating childbirth: Secondary | ICD-10-CM | POA: Diagnosis not present

## 2019-03-13 DIAGNOSIS — Z8659 Personal history of other mental and behavioral disorders: Secondary | ICD-10-CM

## 2019-03-13 DIAGNOSIS — Z8614 Personal history of Methicillin resistant Staphylococcus aureus infection: Secondary | ICD-10-CM | POA: Diagnosis not present

## 2019-03-13 DIAGNOSIS — O1002 Pre-existing essential hypertension complicating childbirth: Principal | ICD-10-CM | POA: Diagnosis present

## 2019-03-13 DIAGNOSIS — O99214 Obesity complicating childbirth: Secondary | ICD-10-CM | POA: Diagnosis present

## 2019-03-13 DIAGNOSIS — O99210 Obesity complicating pregnancy, unspecified trimester: Secondary | ICD-10-CM | POA: Diagnosis present

## 2019-03-13 DIAGNOSIS — J45909 Unspecified asthma, uncomplicated: Secondary | ICD-10-CM | POA: Diagnosis not present

## 2019-03-13 DIAGNOSIS — O0990 Supervision of high risk pregnancy, unspecified, unspecified trimester: Secondary | ICD-10-CM

## 2019-03-13 LAB — CBC
HCT: 36.8 % (ref 36.0–46.0)
Hemoglobin: 12.2 g/dL (ref 12.0–15.0)
MCH: 27.4 pg (ref 26.0–34.0)
MCHC: 33.2 g/dL (ref 30.0–36.0)
MCV: 82.7 fL (ref 80.0–100.0)
Platelets: 188 10*3/uL (ref 150–400)
RBC: 4.45 MIL/uL (ref 3.87–5.11)
RDW: 14.3 % (ref 11.5–15.5)
WBC: 8.5 10*3/uL (ref 4.0–10.5)
nRBC: 0 % (ref 0.0–0.2)

## 2019-03-13 LAB — ABO/RH: ABO/RH(D): A NEG

## 2019-03-13 LAB — TYPE AND SCREEN
ABO/RH(D): A NEG
Antibody Screen: NEGATIVE

## 2019-03-13 LAB — RPR: RPR Ser Ql: NONREACTIVE

## 2019-03-13 MED ORDER — SENNOSIDES-DOCUSATE SODIUM 8.6-50 MG PO TABS
2.0000 | ORAL_TABLET | ORAL | Status: DC
  Administered 2019-03-14 (×2): 2 via ORAL
  Filled 2019-03-13 (×2): qty 2

## 2019-03-13 MED ORDER — SIMETHICONE 80 MG PO CHEW: 80.0000 mg | CHEWABLE_TABLET | ORAL | Status: DC | PRN

## 2019-03-13 MED ORDER — DIPHENHYDRAMINE HCL 25 MG PO CAPS: 25.0000 mg | ORAL_CAPSULE | Freq: Four times a day (QID) | ORAL | Status: DC | PRN

## 2019-03-13 MED ORDER — OXYTOCIN BOLUS FROM INFUSION
500.0000 mL | Freq: Once | INTRAVENOUS | Status: AC
  Administered 2019-03-13: 500 mL via INTRAVENOUS

## 2019-03-13 MED ORDER — ONDANSETRON HCL 4 MG/2ML IJ SOLN
4.0000 mg | Freq: Four times a day (QID) | INTRAMUSCULAR | Status: DC | PRN
  Administered 2019-03-13: 4 mg via INTRAVENOUS
  Filled 2019-03-13: qty 2

## 2019-03-13 MED ORDER — SOD CITRATE-CITRIC ACID 500-334 MG/5ML PO SOLN: 30.0000 mL | ORAL | Status: DC | PRN

## 2019-03-13 MED ORDER — TERBUTALINE SULFATE 1 MG/ML IJ SOLN: 0.2500 mg | Freq: Once | INTRAMUSCULAR | Status: DC | PRN

## 2019-03-13 MED ORDER — LACTATED RINGERS IV SOLN: 500.0000 mL | INTRAVENOUS | Status: DC | PRN

## 2019-03-13 MED ORDER — OXYTOCIN 40 UNITS IN NORMAL SALINE INFUSION - SIMPLE MED
2.5000 [IU]/h | INTRAVENOUS | Status: DC
  Administered 2019-03-13: 2.5 [IU]/h via INTRAVENOUS

## 2019-03-13 MED ORDER — OXYCODONE-ACETAMINOPHEN 5-325 MG PO TABS: 2.0000 | ORAL_TABLET | ORAL | Status: DC | PRN

## 2019-03-13 MED ORDER — DIPHENHYDRAMINE HCL 50 MG/ML IJ SOLN: 12.5000 mg | INTRAMUSCULAR | Status: DC | PRN

## 2019-03-13 MED ORDER — SODIUM CHLORIDE (PF) 0.9 % IJ SOLN
INTRAMUSCULAR | Status: DC | PRN
  Administered 2019-03-13: 12 mL/h via EPIDURAL

## 2019-03-13 MED ORDER — OXYCODONE-ACETAMINOPHEN 5-325 MG PO TABS: 1.0000 | ORAL_TABLET | ORAL | Status: DC | PRN

## 2019-03-13 MED ORDER — LIDOCAINE HCL (PF) 1 % IJ SOLN
INTRAMUSCULAR | Status: DC | PRN
  Administered 2019-03-13: 6 mL via EPIDURAL

## 2019-03-13 MED ORDER — ACETAMINOPHEN 325 MG PO TABS
650.0000 mg | ORAL_TABLET | ORAL | Status: DC | PRN
  Administered 2019-03-14 (×2): 650 mg via ORAL
  Filled 2019-03-13 (×2): qty 2

## 2019-03-13 MED ORDER — CEFAZOLIN SODIUM-DEXTROSE 2-4 GM/100ML-% IV SOLN: 2.0000 g | Freq: Once | INTRAVENOUS | Status: DC

## 2019-03-13 MED ORDER — TETANUS-DIPHTH-ACELL PERTUSSIS 5-2.5-18.5 LF-MCG/0.5 IM SUSP: 0.5000 mL | Freq: Once | INTRAMUSCULAR | Status: DC

## 2019-03-13 MED ORDER — MISOPROSTOL 50MCG HALF TABLET
50.0000 ug | ORAL_TABLET | ORAL | Status: DC
  Administered 2019-03-13: 08:00:00 50 ug via ORAL
  Filled 2019-03-13: qty 1

## 2019-03-13 MED ORDER — ACETAMINOPHEN 325 MG PO TABS: 650.0000 mg | ORAL_TABLET | ORAL | Status: DC | PRN

## 2019-03-13 MED ORDER — EPHEDRINE 5 MG/ML INJ: 10.0000 mg | INTRAVENOUS | Status: DC | PRN

## 2019-03-13 MED ORDER — OXYTOCIN 40 UNITS IN NORMAL SALINE INFUSION - SIMPLE MED
1.0000 m[IU]/min | INTRAVENOUS | Status: DC
  Administered 2019-03-13: 2 m[IU]/min via INTRAVENOUS
  Filled 2019-03-13: qty 1000

## 2019-03-13 MED ORDER — ONDANSETRON HCL 4 MG/2ML IJ SOLN: 4.0000 mg | INTRAMUSCULAR | Status: DC | PRN

## 2019-03-13 MED ORDER — WITCH HAZEL-GLYCERIN EX PADS: 1.0000 "application " | MEDICATED_PAD | CUTANEOUS | Status: DC | PRN

## 2019-03-13 MED ORDER — LACTATED RINGERS IV SOLN
INTRAVENOUS | Status: DC
  Administered 2019-03-13 (×2): via INTRAVENOUS

## 2019-03-13 MED ORDER — LACTATED RINGERS IV SOLN
500.0000 mL | Freq: Once | INTRAVENOUS | Status: AC
  Administered 2019-03-13: 500 mL via INTRAVENOUS

## 2019-03-13 MED ORDER — FENTANYL CITRATE (PF) 100 MCG/2ML IJ SOLN
100.0000 ug | INTRAMUSCULAR | Status: DC | PRN
  Administered 2019-03-13: 100 ug via INTRAVENOUS
  Filled 2019-03-13: qty 2

## 2019-03-13 MED ORDER — PHENYLEPHRINE 40 MCG/ML (10ML) SYRINGE FOR IV PUSH (FOR BLOOD PRESSURE SUPPORT)
80.0000 ug | PREFILLED_SYRINGE | INTRAVENOUS | Status: DC | PRN
  Filled 2019-03-13: qty 10

## 2019-03-13 MED ORDER — PRENATAL MULTIVITAMIN CH
1.0000 | ORAL_TABLET | Freq: Every day | ORAL | Status: DC
  Administered 2019-03-14 – 2019-03-15 (×2): 1 via ORAL
  Filled 2019-03-13 (×2): qty 1

## 2019-03-13 MED ORDER — LIDOCAINE HCL (PF) 1 % IJ SOLN: 30.0000 mL | INTRAMUSCULAR | Status: DC | PRN

## 2019-03-13 MED ORDER — FENTANYL-BUPIVACAINE-NACL 0.5-0.125-0.9 MG/250ML-% EP SOLN
12.0000 mL/h | EPIDURAL | Status: DC | PRN
  Filled 2019-03-13: qty 250

## 2019-03-13 MED ORDER — DIBUCAINE (PERIANAL) 1 % EX OINT: 1.0000 "application " | TOPICAL_OINTMENT | CUTANEOUS | Status: DC | PRN

## 2019-03-13 MED ORDER — IBUPROFEN 600 MG PO TABS
600.0000 mg | ORAL_TABLET | Freq: Four times a day (QID) | ORAL | Status: DC
  Administered 2019-03-14 – 2019-03-15 (×7): 600 mg via ORAL
  Filled 2019-03-13 (×7): qty 1

## 2019-03-13 MED ORDER — COCONUT OIL OIL: 1.0000 "application " | TOPICAL_OIL | Status: DC | PRN

## 2019-03-13 MED ORDER — ONDANSETRON HCL 4 MG PO TABS: 4.0000 mg | ORAL_TABLET | ORAL | Status: DC | PRN

## 2019-03-13 MED ORDER — CEFAZOLIN SODIUM-DEXTROSE 1-4 GM/50ML-% IV SOLN
1.0000 g | Freq: Three times a day (TID) | INTRAVENOUS | Status: DC
  Filled 2019-03-13: qty 50

## 2019-03-13 MED ORDER — ZOLPIDEM TARTRATE 5 MG PO TABS: 5.0000 mg | ORAL_TABLET | Freq: Every evening | ORAL | Status: DC | PRN

## 2019-03-13 MED ORDER — BENZOCAINE-MENTHOL 20-0.5 % EX AERO: 1.0000 "application " | INHALATION_SPRAY | CUTANEOUS | Status: DC | PRN

## 2019-03-13 MED ORDER — ALBUTEROL SULFATE (2.5 MG/3ML) 0.083% IN NEBU: 2.5000 mg | INHALATION_SOLUTION | Freq: Four times a day (QID) | RESPIRATORY_TRACT | Status: DC | PRN

## 2019-03-13 MED ORDER — PHENYLEPHRINE 40 MCG/ML (10ML) SYRINGE FOR IV PUSH (FOR BLOOD PRESSURE SUPPORT): 80.0000 ug | PREFILLED_SYRINGE | INTRAVENOUS | Status: DC | PRN
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
## 2019-03-13 NOTE — Discharge Summary (Signed)
Postpartum Discharge Summary     Patient Name: Tara Blake DOB: 08/01/1996 MRN: [id number]  Date of admission: 03/13/2019 Delivering Provider: Raelyn Mora   Date of discharge: 03/15/2019  Admitting diagnosis: pregnancy Intrauterine pregnancy: [redacted]w[redacted]d     Secondary diagnosis:  Active Problems:   Chronic hypertension affecting pregnancy   Obesity in pregnancy   History of suicidal ideation   Rh negative state in antepartum period   Hx of macrosomia in infant in prior pregnancy, currently pregnant   Indication for care in labor or delivery  Additional problems: none     Discharge diagnosis: Term Pregnancy Delivered                                                                                                Post partum procedures:rhogam  Augmentation: AROM, Pitocin and Cytotec  Complications: None  Hospital course:  Induction of Labor With Vaginal Delivery   23 y.o. yo G3P2002 at [redacted]w[redacted]d was admitted to the hospital 03/13/2019 for induction of labor.  Indication for induction: cHTN.  Patient had an uncomplicated labor course as follows: Membrane Rupture Time/Date: 2:14 PM ,03/13/2019   Intrapartum Procedures: Episiotomy: None [1]                                         Lacerations:  Labial [10]  Patient had delivery of a Viable infant.  Information for the patient's newborn:  My, Bongiovanni [[id number]]  Delivery Method: Vag-Spont(Filed from delivery)   03/13/2019  Details of delivery can be found in separate delivery note.  Patient had a routine postpartum course. Patient is discharged home 03/15/19.  Magnesium Sulfate recieved: No BMZ received: No  Physical exam  Vitals:   03/14/19 1228 03/14/19 1545 03/14/19 2132 03/15/19 0500  BP: 122/86 (!) 128/93 115/77 115/85  Pulse:  (!) 105 91 91  Resp: 16 18 20 16   Temp: 98.2 F (36.8 C) 98 F (36.7 C) 98.2 F (36.8 C) 97.9 F (36.6 C)  TempSrc: Oral Oral Oral Oral  SpO2: 98%     Weight:      Height:        General: alert, cooperative and no distress Lochia: appropriate Uterine Fundus: firm Incision: N/A DVT Evaluation: No evidence of DVT seen on physical exam. Labs: Lab Results  Component Value Date   WBC 8.5 03/13/2019   HGB 12.2 03/13/2019   HCT 36.8 03/13/2019   MCV 82.7 03/13/2019   PLT 188 03/13/2019   CMP Latest Ref Rng & Units 02/07/2019  Glucose 70 - 99 mg/dL 759(F)  BUN 6 - 20 mg/dL 7  Creatinine 6.38 - 4.66 mg/dL 5.99  Sodium 357 - 017 mmol/L 135  Potassium 3.5 - 5.1 mmol/L 3.8  Chloride 98 - 111 mmol/L 107  CO2 22 - 32 mmol/L 19(L)  Calcium 8.9 - 10.3 mg/dL 9.2  Total Protein 6.5 - 8.1 g/dL 6.1(L)  Total Bilirubin 0.3 - 1.2 mg/dL 0.5  Alkaline Phos 38 - 126 U/L 88  AST 15 -  41 U/L 15  ALT 0 - 44 U/L 11    Discharge instruction: per After Visit Summary and "Baby and Me Booklet".  After visit meds:  Allergies as of 03/15/2019      Reactions   Amoxicillin Rash   Did it involve swelling of the face/tongue/throat, SOB, or low BP? Unknown Did it involve sudden or severe rash/hives, skin peeling, or any reaction on the inside of your mouth or nose? Unknown Did you need to seek medical attention at a hospital or doctor's office? Unknown When did it last happen?Infant reaction If all above answers are "NO", may proceed with cephalosporin use.      Medication List    STOP taking these medications   aspirin EC 81 MG tablet   labetalol 200 MG tablet Commonly known as:  NORMODYNE     TAKE these medications   acetaminophen 325 MG tablet Commonly known as:  TYLENOL Take 650 mg by mouth every 6 (six) hours as needed.   albuterol 108 (90 Base) MCG/ACT inhaler Commonly known as:  VENTOLIN HFA Inhale 1-2 puffs into the lungs every 6 (six) hours as needed for wheezing or shortness of breath.   amLODipine 5 MG tablet Commonly known as:  NORVASC Take 1 tablet (5 mg total) by mouth daily.   butalbital-acetaminophen-caffeine 50-325-40 MG tablet Commonly  known as:  FIORICET Take 1-2 tablets by mouth every 6 (six) hours as needed for headache.   calcium carbonate 500 MG chewable tablet Commonly known as:  TUMS - dosed in mg elemental calcium Chew 1 tablet by mouth 4 (four) times daily as needed for indigestion or heartburn.   cyclobenzaprine 10 MG tablet Commonly known as:  FLEXERIL Take 1 tablet (10 mg total) by mouth 3 (three) times daily as needed for muscle spasms.   ibuprofen 800 MG tablet Commonly known as:  ADVIL Take 1 tablet (800 mg total) by mouth every 8 (eight) hours as needed.   prenatal multivitamin Tabs tablet Take 1 tablet by mouth daily at 12 noon.       Diet: routine diet  Activity: Advance as tolerated. Pelvic rest for 6 weeks.   Outpatient follow up:6 weeks Follow up Appt:No future appointments. Follow up Visit: Virtual B/P check with RN approx 3 days from DC  RHOGAM given 03/14/2019 @ 1447    Please schedule this patient for Postpartum visit in: 6 weeks with the following provider: Any provider For C/S patients schedule nurse incision check in weeks 2 weeks: no High risk pregnancy complicated by: HTN Delivery mode:  SVD Anticipated Birth Control:  IUD PP Procedures needed: BP check in 1 week Schedule Integrated BH visit: yes  h/o suicidal ideation  Newborn Data: Live born female "Molli HazardMatthew" Birth Weight:   APGAR: 8, 9  Newborn Delivery   Birth date/time:  03/13/2019 21:07:00 Delivery type:  Vaginal, Spontaneous     Baby Feeding: Bottle and Breast Disposition:home with mother  Thressa ShellerHeather Wendie Diskin DNP, CNM  03/15/19  9:58 AM
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
## 2019-03-13 NOTE — Anesthesia Procedure Notes (Signed)
Epidural Patient location during procedure: OB Start time: 03/13/2019 12:43 PM End time: 03/13/2019 1:48 PM  Staffing Anesthesiologist: Bethena Midget, MD  Preanesthetic Checklist Completed: patient identified, site marked, surgical consent, pre-op evaluation, timeout performed, IV checked, risks and benefits discussed and monitors and equipment checked  Epidural Patient position: sitting Prep: site prepped and draped and DuraPrep Patient monitoring: continuous pulse ox and blood pressure Approach: midline Location: L3-L4 Injection technique: LOR air  Needle:  Needle type: Tuohy  Needle gauge: 17 G Needle length: 9 cm and 9 Needle insertion depth: 6 cm Catheter type: closed end flexible Catheter size: 19 Gauge Catheter at skin depth: 11 cm Test dose: negative  Assessment Events: blood not aspirated, injection not painful, no injection resistance, negative IV test and no paresthesia

## 2019-03-13 NOTE — Anesthesia Preprocedure Evaluation (Signed)
Anesthesia Evaluation  Patient identified by MRN, date of birth, ID band  Reviewed: Allergy & Precautions, Patient's Chart, lab work & pertinent test results  Airway Mallampati: II       Dental no notable dental hx.    Pulmonary asthma , former smoker,    Pulmonary exam normal        Cardiovascular hypertension, Normal cardiovascular exam     Neuro/Psych  Headaches, Depression    GI/Hepatic negative GI ROS, Neg liver ROS,   Endo/Other  negative endocrine ROS  Renal/GU negative Renal ROS     Musculoskeletal   Abdominal (+) + obese,   Peds  Hematology   Anesthesia Other Findings   Reproductive/Obstetrics (+) Pregnancy                             Anesthesia Physical Anesthesia Plan  ASA: III  Anesthesia Plan: Epidural   Post-op Pain Management:    Induction:   PONV Risk Score and Plan:   Airway Management Planned:   Additional Equipment: None  Intra-op Plan:   Post-operative Plan:   Informed Consent: I have reviewed the patients History and Physical, chart, labs and discussed the procedure including the risks, benefits and alternatives for the proposed anesthesia with the patient or authorized representative who has indicated his/her understanding and acceptance.       Plan Discussed with:   Anesthesia Plan Comments: (Performed by Dr. Tacy Dura)        Anesthesia Quick Evaluation

## 2019-03-14 ENCOUNTER — Encounter (HOSPITAL_COMMUNITY): Payer: Self-pay

## 2019-03-14 LAB — HEPATITIS B SURFACE ANTIGEN: Hepatitis B Surface Ag: NEGATIVE

## 2019-03-14 LAB — RUBELLA SCREEN: Rubella: <0.9 index — ABNORMAL LOW (ref >0.99)

## 2019-03-14 MED ORDER — ENALAPRIL MALEATE 5 MG PO TABS
5.0000 mg | ORAL_TABLET | Freq: Every day | ORAL | Status: DC
  Administered 2019-03-14 – 2019-03-15 (×2): 5 mg via ORAL
  Filled 2019-03-14 (×2): qty 1

## 2019-03-14 MED ORDER — RHO D IMMUNE GLOBULIN 1500 UNIT/2ML IJ SOSY
300.0000 ug | PREFILLED_SYRINGE | Freq: Once | INTRAMUSCULAR | Status: AC
  Administered 2019-03-14: 300 ug via INTRAVENOUS
  Filled 2019-03-14: qty 2

## 2019-03-14 NOTE — Anesthesia Postprocedure Evaluation (Signed)
Anesthesia Post Note  Patient: Tara Blake  Procedure(s) Performed: AN AD HOC LABOR EPIDURAL     Patient location during evaluation: Mother Baby Anesthesia Type: Epidural Level of consciousness: awake and alert, oriented and patient cooperative Pain management: pain level controlled Vital Signs Assessment: post-procedure vital signs reviewed and stable Respiratory status: spontaneous breathing Cardiovascular status: stable Postop Assessment: no headache, epidural receding, patient able to bend at knees and no signs of nausea or vomiting Anesthetic complications: no Comments: Pt interviewed via phone call d/t COVID 19.  Pt. States she is able to walk.  Pain score 0.    Last Vitals:  Vitals:   03/14/19 0020 03/14/19 0424  BP: 126/74 (!) 136/93  Pulse: (!) 115 89  Resp: 18 16  Temp: 36.5 C 36.5 C  SpO2:      Last Pain:  Vitals:   03/14/19 0530  TempSrc:   PainSc: 4    Pain Goal:                   [facility]

## 2019-03-14 NOTE — Progress Notes (Signed)
Post Partum Day 1 Subjective: no complaints, up ad lib, voiding, tolerating PO and + flatus  Objective: Blood pressure 122/84, pulse 87, temperature 98 F (36.7 C), temperature source Oral, resp. rate 16, height 5\' 4"  (1.626 m), weight 117 kg, last menstrual period 06/13/2018, SpO2 98 %, unknown if currently breastfeeding.  Physical Exam:  General: alert, cooperative and no distress Lochia: appropriate Uterine Fundus: firm Incision: NA DVT Evaluation: No evidence of DVT seen on physical exam.  Recent Labs    03/13/19 0734  HGB 12.2  HCT 36.8    Assessment/Plan: Plan for discharge tomorrow, Breastfeeding, Circumcision prior to discharge and Contraception IUD   Patient was on labetalol 200mg  TID during pregnancy. No antihypertensives since being here for induction. BP range since delivery: 122-136/74-93. Will resume vasotec 5mg  daily, and adjust as needed tomorrow.   Mom A negative, Baby A positive: rhogam ordered. It has not been administered at this time.   LOS: 1 day   Tara Sheller DNP, CNM  03/14/19  11:10 AM

## 2019-03-14 NOTE — Lactation Note (Signed)
This note was copied from a baby's chart. Lactation Consultation Note  Patient Name: Tara Blake IRJJO'A Date: 03/14/2019 Reason for consult: Initial assessment;Term;Infant weight loss  15 hours old FT female who is being partially BF and formula fed by his mother, she's a P3 but not very experienced BF. She didn't BF her first child, and BF her second one for 2 months but experienced some breastfeeding difficulties dealing with sore nipples. Per mom it was unbearable and that's why she stopped BF; she told LC she noticed that baby's tongue looked "different" and did not go pass the gum line. Baby # 2 was never formally diagnosed with any type of "restrictions" in his mouth, so mom is unable to provide a medical history regarding that matter.   She also inquired about a DEBP because she'd like to provide some breastmilk to her baby. Explained to mom her options, and issued a hand pump in the mean time until she can get her [facility] appointment. Instructions, cleaning and storage were reviewed, as well as milk storage guidelines. DEBP in shortage at the hospital right now. Mom participated in the [facility] program at the [facility].  Offered assistance with latch but baby wasn't ready, he just had a large feeding with a bottle; 25 ml. Reviewed formula supplementation guidelines and their relationship with the size of baby's stomach and the possibility of engorgement later on. Asked mom to call for assistance when needed. Encouraged her to hand express or pump instead if she would like to supplement early on. LC reviewed hand expression with mom, she was very receptive, droplets of colostrum were seen, LC rubbed them on mom's nipples. Reviewed prevention and treatment for sore nipples, normal newborn behavior, cluster feeding and feeding cues.  Feeding plan:  1. Encouraged mom to feed baby STS 8-12 times/24 hours or sooner if feeding cues are present 2. Hand expression/pumping and spoon feeding were also  encouraged 3. Mom will continue supplementing with Ger[employer] Gentle in the meantime but will try to follow formula supplementation guidelines volumes according to baby's age in hours  BF brochure, BF resources and feeding diary were reviewed. Dad present but completely detached from [facility] consultation, he was listening to music on his phone. Mom reported all questions and concerns were answered, she's aware of LC services and will call PRN.  Maternal Data Formula Feeding for Exclusion: Yes Reason for exclusion: Mother's choice to formula and breast feed on admission Has patient been taught Hand Expression?: Yes Does the patient have breastfeeding experience prior to this delivery?: Yes  Feeding Feeding Type: Formula   Interventions Interventions: Breast feeding basics reviewed;Breast massage;Hand express;Breast compression;Hand pump  Lactation Tools Discussed/Used Tools: Pump Breast pump type: Manual WIC Program: Yes Pump Review: Setup, frequency, and cleaning;Milk Storage Initiated by:: MPeck Date initiated:: 03/14/19   Consult Status Consult Status: Follow-up Date: 03/15/19 Follow-up type: In-patient    Tara Blake 03/14/2019, 12:17 PM
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
## 2019-03-15 LAB — RH IG WORKUP (INCLUDES ABO/RH)
ABO/RH(D): A NEG
Fetal Screen: NEGATIVE
Gestational Age(Wks): 39
Unit division: 0

## 2019-03-15 MED ORDER — MEASLES, MUMPS & RUBELLA VAC IJ SOLR
0.5000 mL | Freq: Once | INTRAMUSCULAR | Status: AC
  Administered 2019-03-15: 0.5 mL via SUBCUTANEOUS
  Filled 2019-03-15: qty 0.5

## 2019-03-15 MED ORDER — AMLODIPINE BESYLATE 5 MG PO TABS: 5.0000 mg | ORAL_TABLET | Freq: Every day | ORAL | 1 refills | Status: DC

## 2019-03-15 MED ORDER — IBUPROFEN 800 MG PO TABS: 800.0000 mg | ORAL_TABLET | Freq: Three times a day (TID) | ORAL | 0 refills | Status: DC | PRN

## 2019-03-15 NOTE — Lactation Note (Addendum)
This note was copied from a baby's chart. Lactation Consultation Note  Patient Name: Tara Blake BTCYE'L Date: 03/15/2019 Reason for consult: Follow-up assessment;Term;Infant weight loss  Baby is 69 hours old  LC reviewed and updated the doc flow sheets per mom and dad.  Per mom baby last fed at 7:17am and took 20 ml .  LC explored options of transitioning baby back to the breast if mom desires.  LC explained to mom since the baby has had so many bottles in order to take the baby to the breast  Calm, feed baby and appetizer of formula after breast massage, hand express, pre-pump, and latch.  It may takes some time due to the baby being use to the quick flow.  Mom denies sore nipples, just that her breast are getting fuller today.  Sore nipple and engorgement prevention and tx reviewed, storage of breast milk and mom already has a hand pump.  Per mom active with [place] / WIC and plans to call them regarding obtaining a DEBP Monday am.  Mom aware of the [facility] breast feeding resources after D/c.   LC stressed the importance if the baby isn't going to the breast to increase volume to at least 30 ml ,  If mom decides to just pump and bottle or switch and just formula feed the volume per feeding needs to  Increase to 45 ml or greater by the end of a week per feeding.     Maternal Data    Feeding Feeding Type: (per mom baby last fed at 7:17 am )  LATCH Score                   Interventions Interventions: Breast feeding basics reviewed;Hand pump  Lactation Tools Discussed/Used Tools: Pump Breast pump type: Manual WIC Program: Yes(per mom plans to call [facility] Monday am for a DEBP ) Pump Review: Milk Storage   Consult Status Consult Status: Complete Date: 03/15/19    Matilde Sprang Jiraiya Mcewan 03/15/2019, 9:02 AM

## 2019-03-17 ENCOUNTER — Other Ambulatory Visit: Payer: Self-pay | Admitting: Obstetrics and Gynecology

## 2019-03-17 DIAGNOSIS — F332 Major depressive disorder, recurrent severe without psychotic features: Secondary | ICD-10-CM

## 2019-03-17 DIAGNOSIS — Z8659 Personal history of other mental and behavioral disorders: Secondary | ICD-10-CM

## 2019-03-17 NOTE — Progress Notes (Signed)
amb  

## 2019-03-18 ENCOUNTER — Ambulatory Visit (INDEPENDENT_AMBULATORY_CARE_PROVIDER_SITE_OTHER): Payer: Medicaid Other

## 2019-03-18 VITALS — BP 126/86 | HR 82

## 2019-03-18 DIAGNOSIS — Z0130 Encounter for examination of blood pressure without abnormal findings: Secondary | ICD-10-CM

## 2019-03-18 NOTE — Progress Notes (Signed)
Agree with A & P. 

## 2019-03-18 NOTE — Progress Notes (Signed)
S/w pt via tele visit for post partum BP check. Pt had vaginal delivery 03-13-19, history of chronic HTN, currently taking amlodipine 5mg  BP today 133/89, pulse 91 second reading 126/86, pulse 82. Pt is breastfeeding occasionally. Per provider, advised pt to continue taking amlodipine and follow up at pp visit 04-14-19, pt agreed.

## 2019-03-25 ENCOUNTER — Ambulatory Visit: Payer: Medicaid Other | Admitting: Clinical

## 2019-03-25 ENCOUNTER — Other Ambulatory Visit: Payer: Self-pay

## 2019-03-25 NOTE — BH Specialist Note (Signed)
Pt did not answer the phone to set up Webex video visit for today; Left HIPPA-compliant message to call back Asher Muir from [employer] for [employer] at [phone].    Integrated Behavioral Health Initial Visit  MRN: [id number] Name: Tara Blake [facility]

## 2019-03-26 ENCOUNTER — Other Ambulatory Visit: Payer: Self-pay

## 2019-03-26 ENCOUNTER — Ambulatory Visit (INDEPENDENT_AMBULATORY_CARE_PROVIDER_SITE_OTHER): Payer: Medicaid Other | Admitting: Clinical

## 2019-03-26 DIAGNOSIS — F4321 Adjustment disorder with depressed mood: Secondary | ICD-10-CM

## 2019-03-26 NOTE — BH Specialist Note (Signed)
Integrated Behavioral Health Initial Visit  MRN: [id number] Name: Tara Blake  Number of Integrated Behavioral Health Clinician visits:: 1/6 Session Start time: 3:33  Session End time: 3:54 Total time: 20 minutes  Type of Service: Integrated Behavioral Health- Individual/Family Interpretor:No. Interpretor Name and Language: n/a   Warm Hand Off Completed.       SUBJECTIVE: Tara Blake is a 23 y.o. female accompanied by Newborn son Patient was referred by Raelyn Mora, CNM for history of MDD, and history of SI. Patient reports the following symptoms/concerns: Pt states her primary concern today is fatigue and mild irritability from fatigue, and crying spells that come once per day for " a few minutes" only each day, and then pass.  Duration of problem: Postpartum; Severity of problem: mild  OBJECTIVE: Mood: Normal and Affect: Appropriate Risk of harm to self or others: No plan to harm self or others  LIFE CONTEXT: Family and Social: Pt feels well-supported by family and friends School/Work: Sleeping when baby sleeps; eating well postpartum Life Changes: Recent childbirth  GOALS ADDRESSED: Patient will: 1. Reduce symptoms of: depression 2. Increase knowledge and/or ability of: healthy habits  3. Demonstrate ability to: Increase healthy adjustment to current life circumstances and Increase adequate support systems for patient/family  INTERVENTIONS: Interventions utilized: Psychoeducation and/or Health Education and Link to [employer]  Standardized Assessments completed: GAD-7 and PHQ 9  ASSESSMENT: Patient currently experiencing Adjustment disorder with depressed mood   Patient may benefit from psychoeducation and brief therapeutic interventions regarding coping with symptoms of depression .  PLAN: 1. Follow up with behavioral health clinician on : As needed, if symptoms increase past 2 weeks postpartum 2. Behavioral recommendations:  -Continue taking  prenatal vitamin daily, until postpartum visit -Continue sleeping when baby sleeps at night, and as able during the day -Register for at least one new mom support group at either [email or web address].com or [email or web address].net prior to postpartum visit 3. Referral(s): Integrated Art gallery manager (In Clinic) and [employer]:  New mom support 4. "From scale of 1-10, how likely are you to follow plan?": 10  Type of Visit: Virtual Patient location: Home [facility] Provider location: [facility] All persons participating in visit: Patient Sahanna Eastep and [facility] Jamie McMannes  Confirmed patient's address: Yes  Confirmed patient's phone number: Yes  Any changes to demographics: No   Confirmed patient's insurance: Yes  Any changes to patient's insurance: No   Discussed confidentiality: Yes    The following statements were read to the patient and/or legal guardian that are established with the [facility] Provider.  "The purpose of this phone visit is to provide behavioral health care while limiting exposure to the coronavirus (COVID19).  There is a possibility of technology failure and discussed alternative modes of communication if that failure occurs."  "By engaging in this telephone visit, you consent to the provision of healthcare.  Additionally, you authorize for your insurance to be billed for the services provided during this telephone visit."   Patient and/or legal guardian consented to telephone visit: Yes   STRENGTHS (Protective Factors/Coping Skills): Supportive friends and family; positive outlook; skilled gained from last bout of depression   Valetta Close McMannes

## 2019-03-31 IMAGING — US US MFM OB FOLLOW-UP
1 series · 13 of 28 positions shown · non-contrast
Comparison: none

[Series 1: us mfm ob follow-up · 79 acquisitions, 13 frames shown]
[im 3/79]
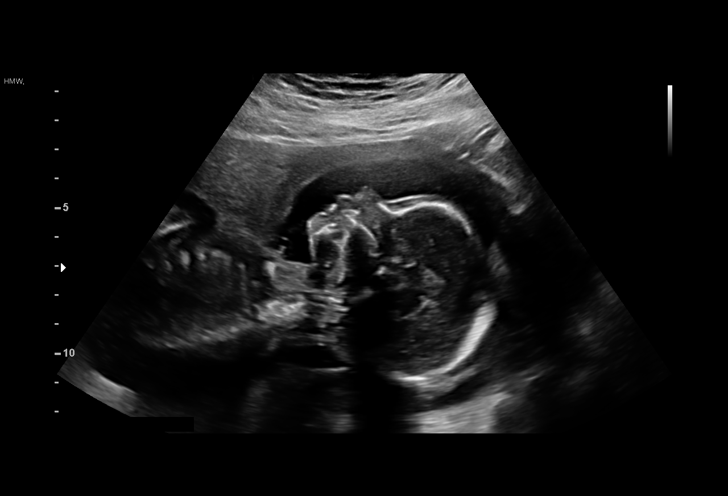
[im 9/79]
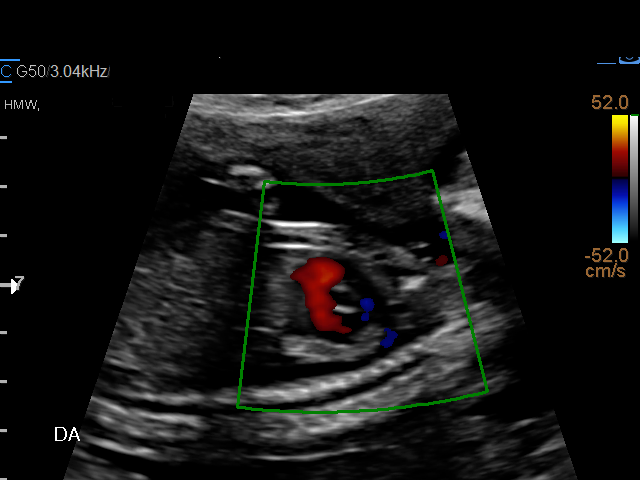
[im 15/79]
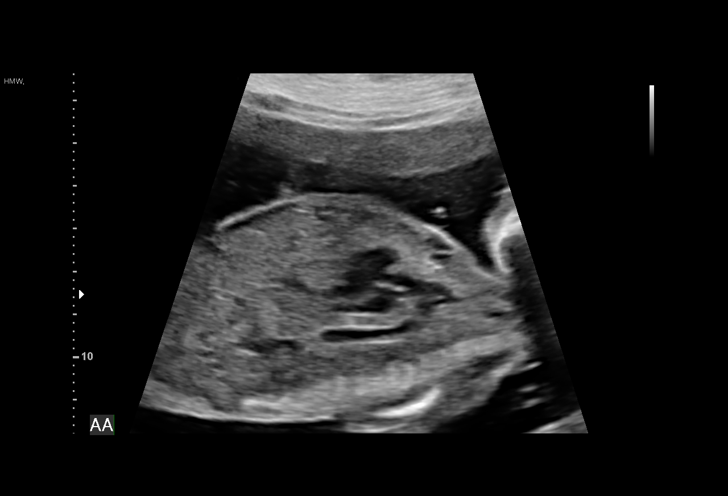
[im 21/79]
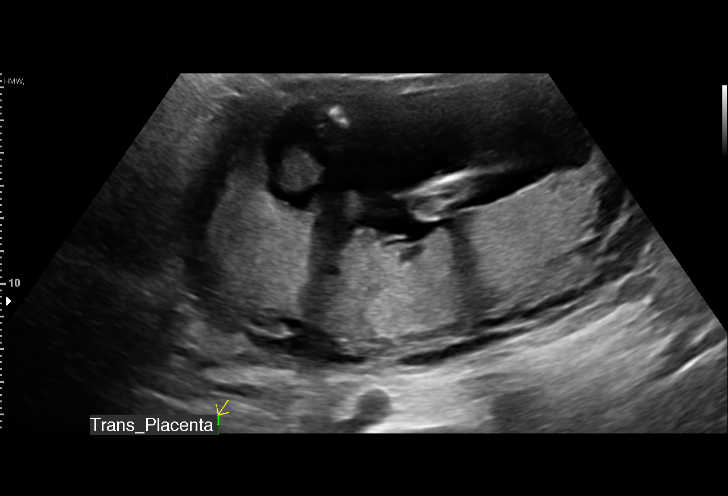
[im 27/79]
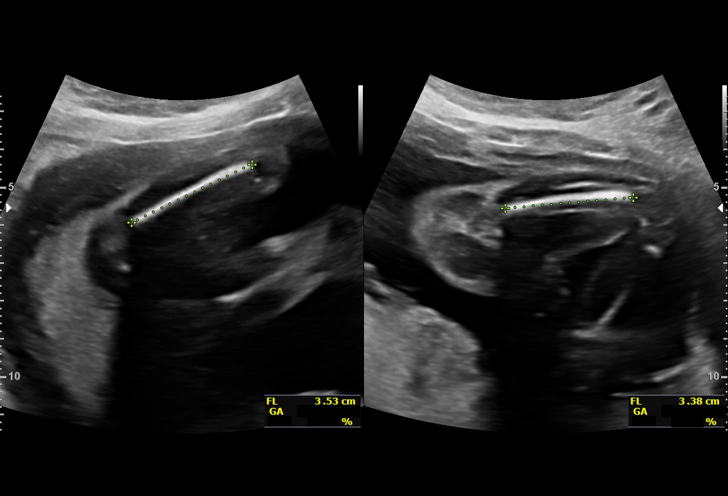
[im 32/79]
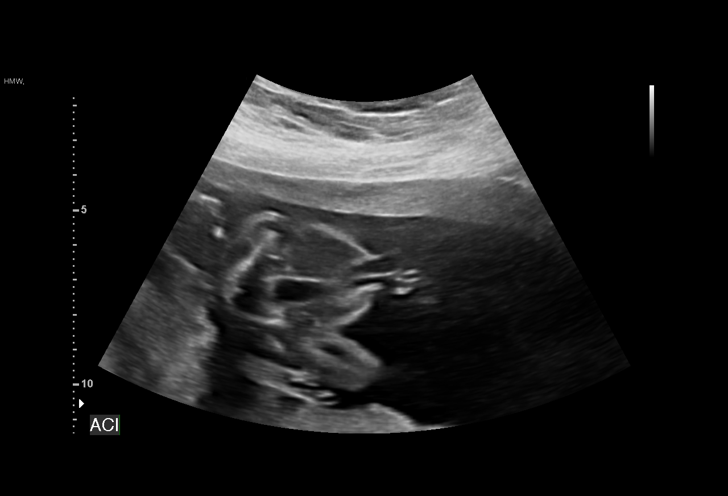
[im 41/79]
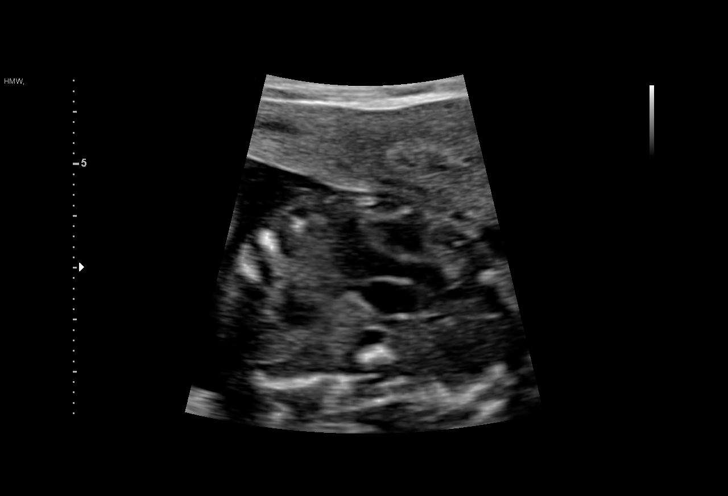
[im 47/79]
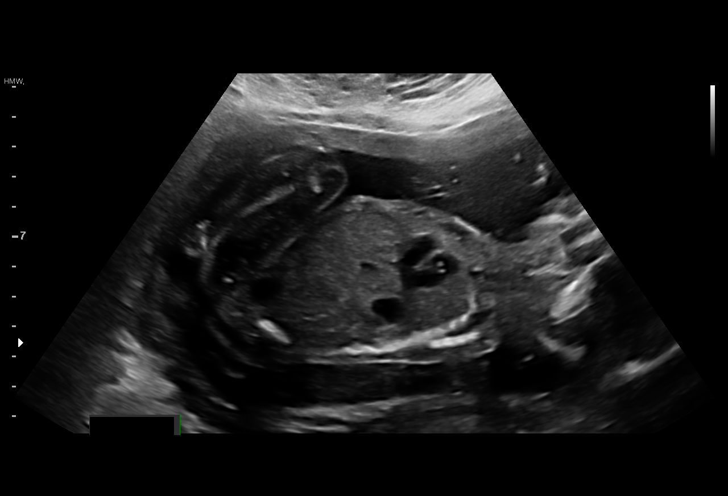
[im 53/79]
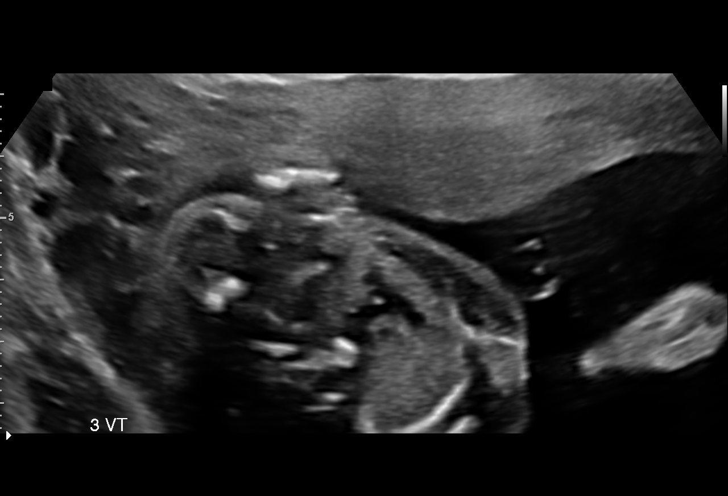
[im 58/79]
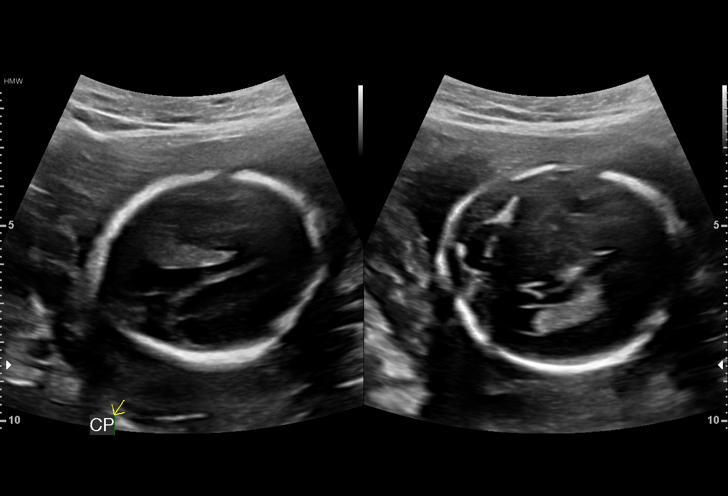
[im 64/79]
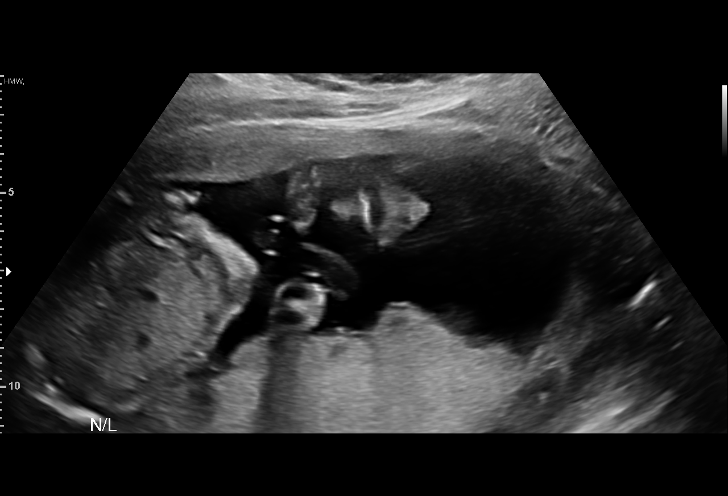
[im 70/79]
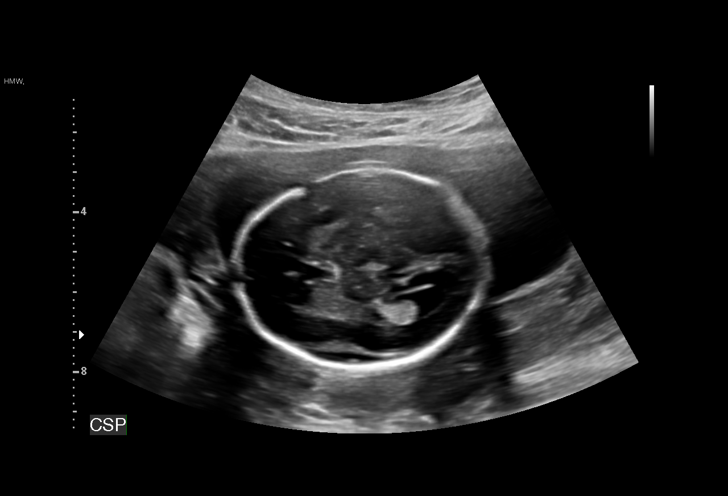
[im 76/79]
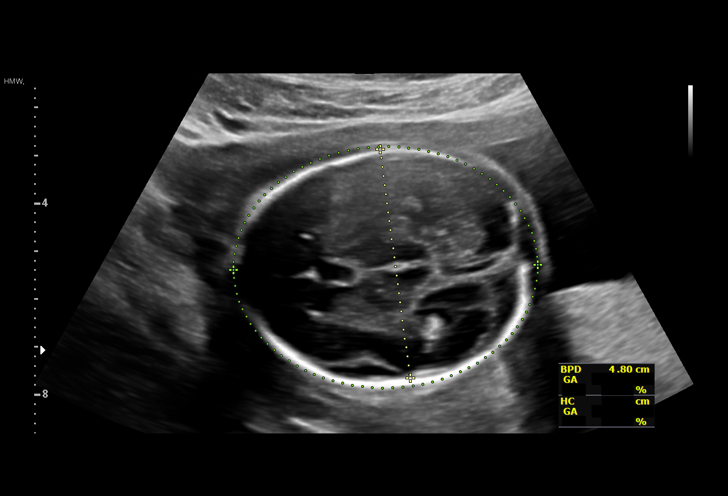

[13 of 28 positions shown; findings below may reference images not displayed]

----------------------------------------------------------------------

 ----------------------------------------------------------------------
Indications

  Hypertension - Chronic/Pre-existing
  Poor obstetric history: Prior fetal
  macrosomia, antepartum
  Obesity complicating pregnancy, second
  trimester
  Encounter for other antenatal screening
  follow-up (Low risk NIPS, AFP redrawn)
  20 weeks gestation of pregnancy
 ----------------------------------------------------------------------
Vital Signs

                                                Height:        5'4"
Fetal Evaluation

 Num Of Fetuses:          1
 Fetal Heart              133
 Rate(bpm):
 Cardiac Activity:        Observed
 Presentation:            Cephalic
 Placenta:                Posterior
 P. Cord Insertion:       Visualized, central

 Amniotic Fluid
 AFI FV:      Within normal limits

                             Largest Pocket(cm)

Biometry

 BPD:      47.8  mm     G. Age:  20w 3d         44  %    CI:         72.82  %    70 - 86
                                                         FL/HC:       19.4  %    15.9 -
 HC:      178.1  mm     G. Age:  20w 2d         28  %    HC/AC:       1.07       1.06 -
 AC:      166.7  mm     G. Age:  21w 5d         78  %    FL/BPD:      72.2  %
 FL:       34.5  mm     G. Age:  20w 6d         52  %    FL/AC:       20.7  %    20 - 24
 HUM:      33.4  mm     G. Age:  21w 2d         69  %

 Est. FW:     405   g    0 lb 14 oz      55  %
                    m
OB History

 Gravidity:    3         Term:   2
 Living:       2
Gestational Age

 LMP:           20w 4d        Date:  06/13/18                 EDD:    03/20/19
 U/S Today:     20w 6d                                        EDD:    03/18/19
 Best:          20w 4d     Det. By:  LMP  (06/13/18)          EDD:    03/20/19
Anatomy

 Cranium:               Appears normal         Aortic Arch:            Appears normal
 Cavum:                 Previously seen        Ductal Arch:            Appears normal
 Ventricles:            Appears normal         Diaphragm:              Previously seen
 Choroid Plexus:        Appears normal         Stomach:                Appears normal,
                                                                       left sided
 Cerebellum:            Appears normal         Abdomen:                Previously seen
 Posterior Fossa:       Appears normal         Abdominal Wall:         Appears nml (cord
                                                                       insert, abd wall)
 Nuchal Fold:           Appears normal         Cord Vessels:           Appears normal (3
                                                                       vessel cord)
 Face:                  Orbits and profile     Kidneys:                Appear normal
                        previously seen
 Lips:                  Appears normal         Bladder:                Appears normal
 Thoracic:              Appears normal         Spine:                  Not well visualized
 Heart:                 Appears normal         Upper Extremities:      Previously seen
                        (4CH, axis, and
                        situs
 RVOT:                  Not well visualized    Lower Extremities:      Previously seen
 LVOT:                  Appears normal

 Other:  Technically difficult due to early gestational age. Heels and 5th digit
         visualized. Nasal bone visualized.
Cervix Uterus Adnexa

 Cervix
 Length:            3.7  cm.
 Normal appearance by transabdominal scan.

 Uterus
 No abnormality visualized.

 Left Ovary
 No adnexal mass visualized.

 Right Ovary
 No adnexal mass visualized.

 Cul De Sac
 No free fluid seen.
 Adnexa
 No abnormality visualized.
Impression

 Normal interval growth.
Recommendations

 Follow up as clincally indicated growth and anatomy in 4
 weeks.

## 2019-04-14 ENCOUNTER — Ambulatory Visit (INDEPENDENT_AMBULATORY_CARE_PROVIDER_SITE_OTHER): Payer: Medicaid Other | Admitting: Obstetrics & Gynecology

## 2019-04-14 ENCOUNTER — Other Ambulatory Visit: Payer: Self-pay

## 2019-04-14 ENCOUNTER — Encounter: Payer: Self-pay | Admitting: Obstetrics & Gynecology

## 2019-04-14 DIAGNOSIS — Z30430 Encounter for insertion of intrauterine contraceptive device: Secondary | ICD-10-CM

## 2019-04-14 DIAGNOSIS — Z3202 Encounter for pregnancy test, result negative: Secondary | ICD-10-CM | POA: Diagnosis not present

## 2019-04-14 LAB — POCT URINE PREGNANCY: Preg Test, Ur: NEGATIVE

## 2019-04-14 MED ORDER — LEVONORGESTREL 20 MCG/24HR IU IUD
INTRAUTERINE_SYSTEM | Freq: Once | INTRAUTERINE | Status: AC
  Administered 2019-04-14: 12:00:00 via INTRAUTERINE

## 2019-04-14 NOTE — Patient Instructions (Signed)

## 2019-04-14 NOTE — Progress Notes (Signed)
Pt is getting IUD inserted today.  Marland Kitchen.Post Partum Exam  Tara Blake is a 23 y.o. G45P3003 female who presents for a postpartum visit. She is 4 weeks postpartum following a spontaneous vaginal delivery. I have fully reviewed the prenatal and intrapartum course. The delivery was at 39.0 gestational weeks.  Anesthesia: epidural. Postpartum course has been good. Baby's course has been good. Baby is feeding by bottle - Nutra. Bleeding staining only. Bowel function is normal. Bladder function is normal. Patient is sexually active. Contraception method is none. Postpartum depression screening:neg  The following portions of the patient's history were reviewed and updated as appropriate: allergies, current medications, past family history, past medical history, past social history, past surgical history and problem list.   Review of Systems Pertinent items are noted in HPI.    Objective:  currently breastfeeding.  General:  alert, cooperative and no distress           Abdomen: soft, non-tender; bowel sounds normal; no masses,  no organomegaly   Vulva:  normal  Vagina: normal vagina  Cervix:  no lesions  Corpus: not examined  Adnexa:  not evaluated  Rectal Exam: Not performed.     Mirena insertion    Patient identified, informed consent performed, signed copy in chart, time out was performed.  Urine pregnancy test negative. She denies vaginal intercourse since delivery  Speculum placed in the vagina.  Cervix visualized.  Cleaned with Betadine x 2.  Grasped anteriourly with a single tooth tenaculum.  Uterus sounded to 8 cm.  Mirena IUD placed per manufacturer's recommendations.  Strings trimmed to 3 cm.   Patient given post procedure instructions and Mirena care card with expiration date.  Patient is asked to check IUD strings periodically and follow up in 4-6 weeks for IUD check. Assessment:    normal postpartum exam. Pap smear not done at today's visit.   Plan:   1. Contraception:  IUD 2. Mirena placed today 3. Follow up in: 4 weeks or as needed.   Woodroe Mode, MD 04/14/2019

## 2019-05-12 ENCOUNTER — Encounter: Payer: Self-pay | Admitting: Obstetrics & Gynecology

## 2019-05-12 ENCOUNTER — Ambulatory Visit (INDEPENDENT_AMBULATORY_CARE_PROVIDER_SITE_OTHER): Payer: Medicaid Other | Admitting: Obstetrics & Gynecology

## 2019-05-12 ENCOUNTER — Other Ambulatory Visit: Payer: Self-pay

## 2019-05-12 VITALS — BP 128/99 | HR 97 | Temp 97.9°F | Wt 234.0 lb

## 2019-05-12 DIAGNOSIS — Z30017 Encounter for initial prescription of implantable subdermal contraceptive: Secondary | ICD-10-CM

## 2019-05-12 DIAGNOSIS — Z3202 Encounter for pregnancy test, result negative: Secondary | ICD-10-CM | POA: Diagnosis not present

## 2019-05-12 DIAGNOSIS — Z30432 Encounter for removal of intrauterine contraceptive device: Secondary | ICD-10-CM | POA: Diagnosis not present

## 2019-05-12 LAB — POCT URINE PREGNANCY: Preg Test, Ur: NEGATIVE

## 2019-05-12 MED ORDER — ETONOGESTREL 68 MG ~~LOC~~ IMPL
68.0000 mg | DRUG_IMPLANT | Freq: Once | SUBCUTANEOUS | Status: AC
  Administered 2019-05-12: 13:00:00 68 mg via SUBCUTANEOUS

## 2019-05-12 NOTE — Progress Notes (Signed)
Patient ID: Tara Blake, female   DOB: 1996-05-16, 23 y.o.   MRN: [id number] GYNECOLOGY OFFICE PROCEDURE NOTE  Tara Blake is a 23 y.o. G3P3003 here for  Nexplanon insertion.  .  No other gynecologic concerns. She requests IUD removal due to pain. Nexplanon Insertion Procedure Pelvic: string identified at os and IUD removed intact  Patient identified, informed consent performed, consent signed.   Patient does understand that irregular bleeding is a very common side effect of this medication. She was advised to have backup contraception for one week after placement. Pregnancy test in clinic today was negative.  Appropriate time out taken.  Patient's left arm was prepped and draped in the usual sterile fashion. The ruler used to measure and mark insertion area.  Patient was prepped with alcohol swab and then injected with 3 ml of 1% lidocaine.  She was prepped with betadine, Nexplanon removed from packaging,  Device confirmed in needle, then inserted full length of needle and withdrawn per handbook instructions. Nexplanon was able to palpated in the patient's arm; patient palpated the insert herself. There was minimal blood loss.  Patient insertion site covered with gauze and a pressure bandage to reduce any bruising.  The patient tolerated the procedure well and was given post procedure instructions.   Woodroe Mode, MD Attending Obstetrician & Gynecologist, [place] [facility] [place]  05/12/2019

## 2019-05-12 NOTE — Progress Notes (Signed)
Pt presents for IUD removal and Nexplanon  Insertion   Pt complains of Pain w/ IUD, heavy bleeding associated clots and mood swings. Pt wishes to change contraception method to Nexplanon.   Pt notes pain w/ hemorrhoids and rectal bleeding last BM yesterday after 2 days of not having BM .   UPT: NEGATIVE

## 2019-05-12 NOTE — Patient Instructions (Signed)
Etonogestrel implant What is this medicine? ETONOGESTREL (et oh noe JES trel) is a contraceptive (birth control) device. It is used to prevent pregnancy. It can be used for up to 3 years. This medicine may be used for other purposes; ask your health care provider or pharmacist if you have questions. COMMON BRAND NAME(S): Implanon, Nexplanon What should I tell my health care provider before I take this medicine? They need to know if you have any of these conditions:  abnormal vaginal bleeding  blood vessel disease or blood clots  breast, cervical, endometrial, ovarian, liver, or uterine cancer  diabetes  gallbladder disease  heart disease or recent heart attack  high blood pressure  high cholesterol or triglycerides  kidney disease  liver disease  migraine headaches  seizures  stroke  tobacco smoker  an unusual or allergic reaction to etonogestrel, anesthetics or antiseptics, other medicines, foods, dyes, or preservatives  pregnant or trying to get pregnant  breast-feeding How should I use this medicine? This device is inserted just under the skin on the inner side of your upper arm by a health care professional. Talk to your pediatrician regarding the use of this medicine in children. Special care may be needed. Overdosage: If you think you have taken too much of this medicine contact a poison control center or emergency room at once. NOTE: This medicine is only for you. Do not share this medicine with others. What if I miss a dose? This does not apply. What may interact with this medicine? Do not take this medicine with any of the following medications:  amprenavir  fosamprenavir This medicine may also interact with the following medications:  acitretin  aprepitant  armodafinil  bexarotene  bosentan  carbamazepine  certain medicines for fungal infections like fluconazole, ketoconazole, itraconazole and voriconazole  certain medicines to treat  hepatitis, HIV or AIDS  cyclosporine  felbamate  griseofulvin  lamotrigine  modafinil  oxcarbazepine  phenobarbital  phenytoin  primidone  rifabutin  rifampin  rifapentine  St. John's wort  topiramate This list may not describe all possible interactions. Give your health care provider a list of all the medicines, herbs, non-prescription drugs, or dietary supplements you use. Also tell them if you smoke, drink alcohol, or use illegal drugs. Some items may interact with your medicine. What should I watch for while using this medicine? This product does not protect you against HIV infection (AIDS) or other sexually transmitted diseases. You should be able to feel the implant by pressing your fingertips over the skin where it was inserted. Contact your doctor if you cannot feel the implant, and use a non-hormonal birth control method (such as condoms) until your doctor confirms that the implant is in place. Contact your doctor if you think that the implant may have broken or become bent while in your arm. You will receive a user card from your health care provider after the implant is inserted. The card is a record of the location of the implant in your upper arm and when it should be removed. Keep this card with your health records. What side effects may I notice from receiving this medicine? Side effects that you should report to your doctor or health care professional as soon as possible:  allergic reactions like skin rash, itching or hives, swelling of the face, lips, or tongue  breast lumps, breast tissue changes, or discharge  breathing problems  changes in emotions or moods  if you feel that the implant may have broken or   bent while in your arm  high blood pressure  pain, irritation, swelling, or bruising at the insertion site  scar at site of insertion  signs of infection at the insertion site such as fever, and skin redness, pain or discharge  signs and  symptoms of a blood clot such as breathing problems; changes in vision; chest pain; severe, sudden headache; pain, swelling, warmth in the leg; trouble speaking; sudden numbness or weakness of the face, arm or leg  signs and symptoms of liver injury like dark yellow or brown urine; general ill feeling or flu-like symptoms; light-colored stools; loss of appetite; nausea; right upper belly pain; unusually weak or tired; yellowing of the eyes or skin  unusual vaginal bleeding, discharge Side effects that usually do not require medical attention (report to your doctor or health care professional if they continue or are bothersome):  acne  breast pain or tenderness  headache  irregular menstrual bleeding  nausea This list may not describe all possible side effects. Call your doctor for medical advice about side effects. You may report side effects to FDA at 1-800-FDA-1088. Where should I keep my medicine? This drug is given in a hospital or clinic and will not be stored at home. NOTE: This sheet is a summary. It may not cover all possible information. If you have questions about this medicine, talk to your doctor, pharmacist, or health care provider.  2020 Elsevier/Gold Standard (2017-09-03 14:11:42)  

## 2019-07-15 IMAGING — US US MFM FETAL BPP WO NON STRESS
1 series · 15 of 15 positions shown · non-contrast
Comparison: none

[Series 1: us mfm fetal bpp wo non stress · 15 acquisitions, 15 frames shown]
[im 1/15]
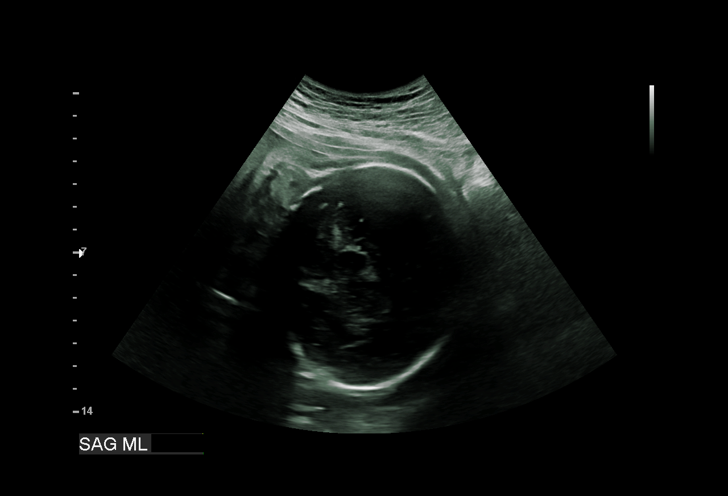
[im 2/15]
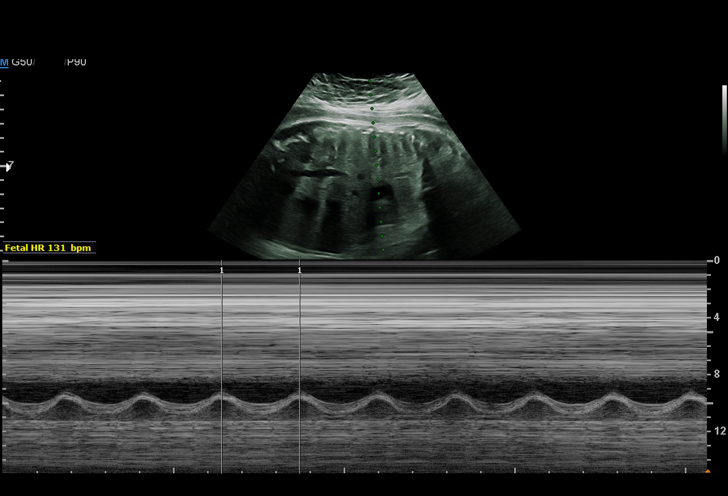
[im 3/15]
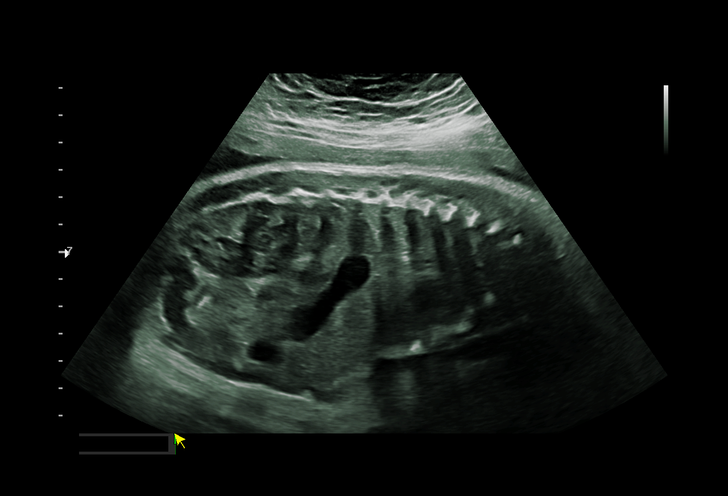
[im 4/15]
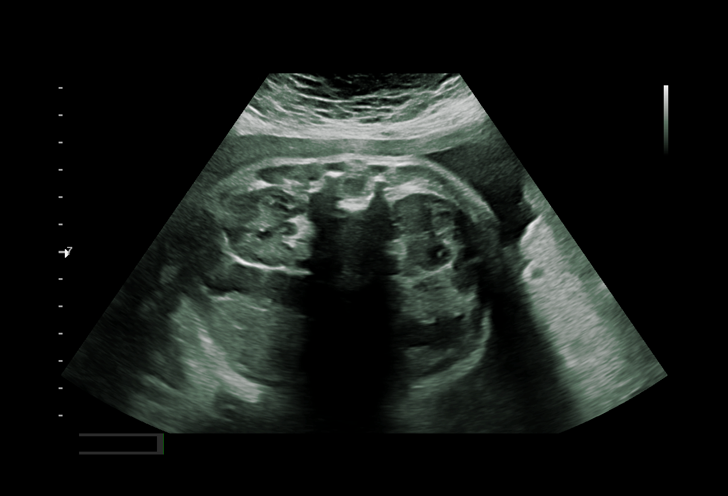
[im 5/15]
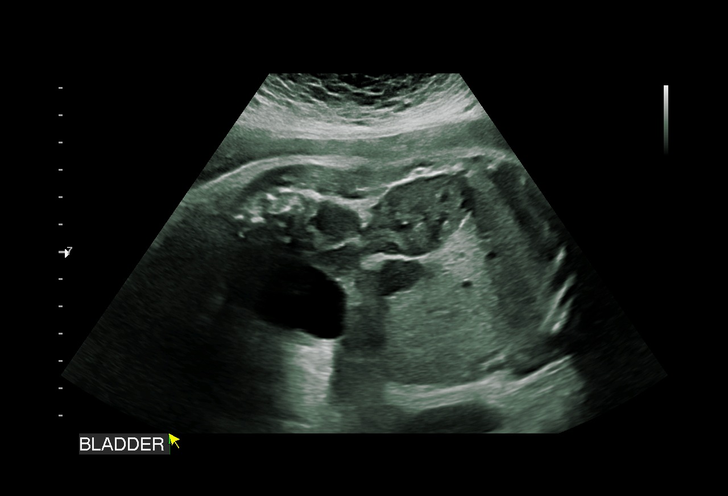
[im 6/15]
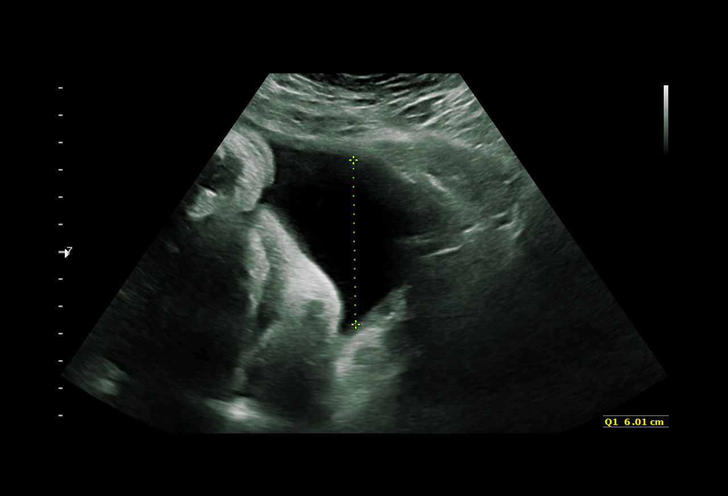
[im 7/15]
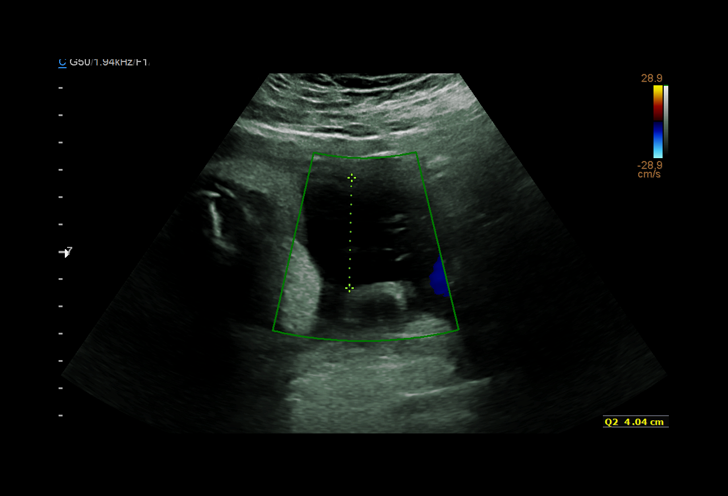
[im 8/15]
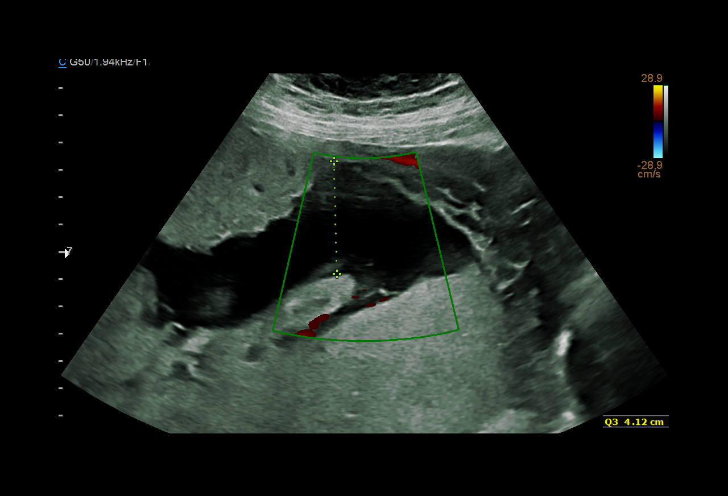
[im 9/15]
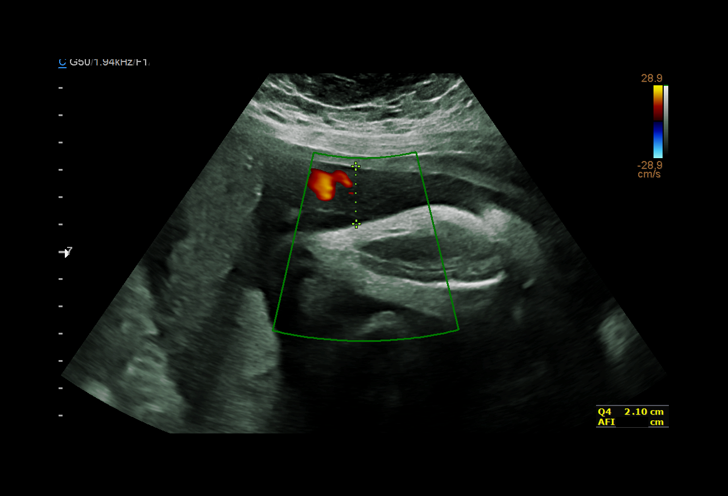
[im 10/15]
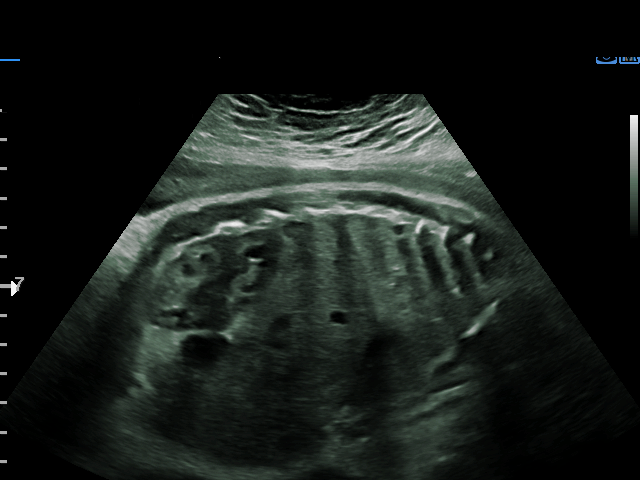
[im 11/15]
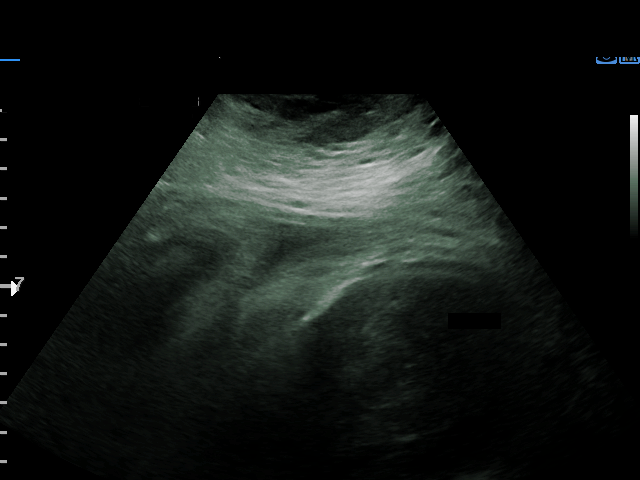
[im 12/15]
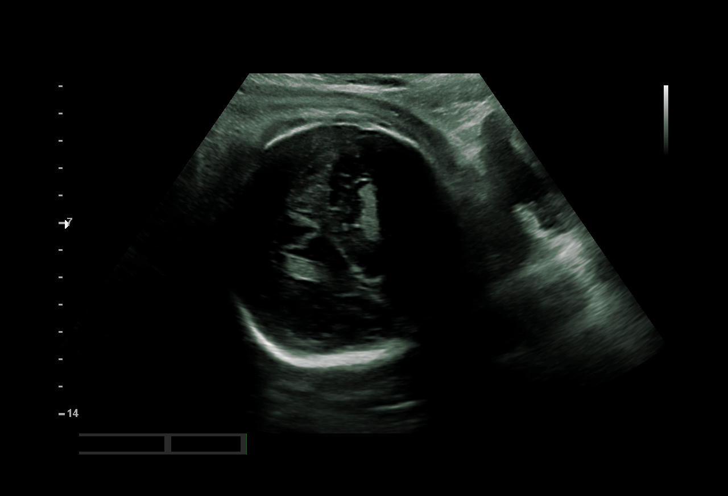
[im 13/15]
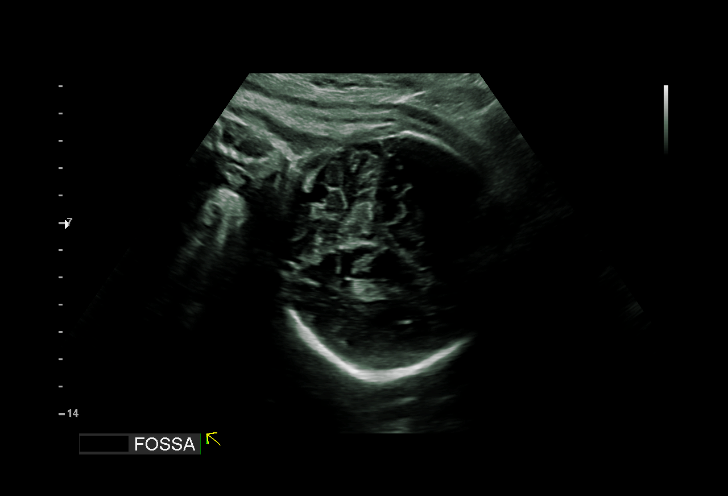
[im 14/15]
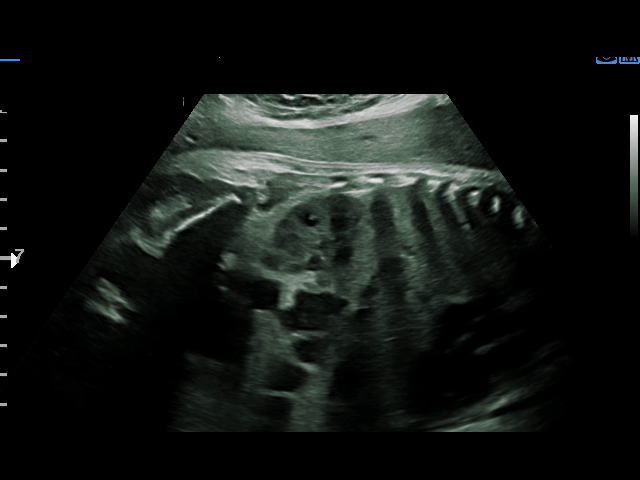
[im 15/15]
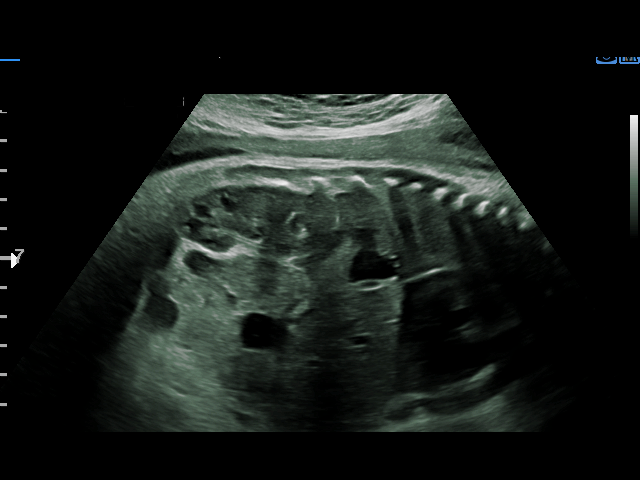

[15 of 15 positions shown; findings below may reference images not displayed]

STRESS                                            JIM
 ----------------------------------------------------------------------

 ----------------------------------------------------------------------
Indications

  Hypertension - Chronic/Pre-existing
  (labetalol)
  35 weeks gestation of pregnancy
  Poor obstetric history: Prior fetal
  macrosomia, antepartum
  Obesity complicating pregnancy, third
  trimester
  Encounter for other antenatal screening
  follow-up
 ----------------------------------------------------------------------
Vital Signs

 BMI:
Fetal Evaluation

 Num Of Fetuses:         1
 Fetal Heart Rate(bpm):  131
 Cardiac Activity:       Observed
 Presentation:           Cephalic

 Amniotic Fluid
 AFI FV:      Within normal limits

 AFI Sum(cm)     %Tile       Largest Pocket(cm)
 16.27           60

 RUQ(cm)       RLQ(cm)       LUQ(cm)        LLQ(cm)

Biophysical Evaluation

 Amniotic F.V:   Within normal limits       F. Tone:        Observed
 F. Movement:    Observed                   Score:          [DATE]
 F. Breathing:   Observed
OB History

 Gravidity:    3         Term:   2
 Living:       2
Gestational Age

 LMP:           35w 5d        Date:  06/13/18                 EDD:   03/20/19
 Best:          35w 5d     Det. By:  LMP  (06/13/18)          EDD:   03/20/19
Anatomy

 Choroid Plexus:        Appears normal         Stomach:                Appears normal, left
                                                                       sided
 Cerebellum:            Appears normal         Kidneys:                Appear normal
 Posterior Fossa:       Appears normal         Bladder:                Appears normal
Cervix Uterus Adnexa

 Cervix
 Not visualized (advanced GA >44wks)
Impression

 Biophysical profile [DATE]
Recommendations

 Continue weekly testing.

## 2019-08-05 IMAGING — US US MFM OB FOLLOW UP
1 series · 14 of 27 positions shown · non-contrast
Comparison: none

[Series 1: us mfm ob follow up · 27 acquisitions, 14 frames shown]
[im 1/27]
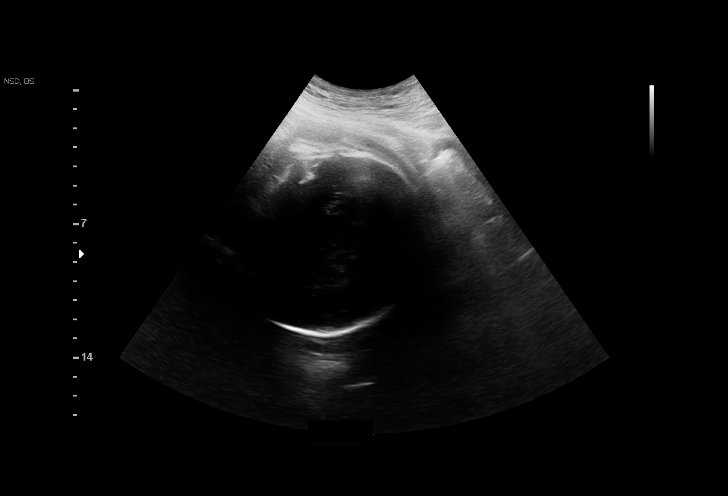
[im 3/27]
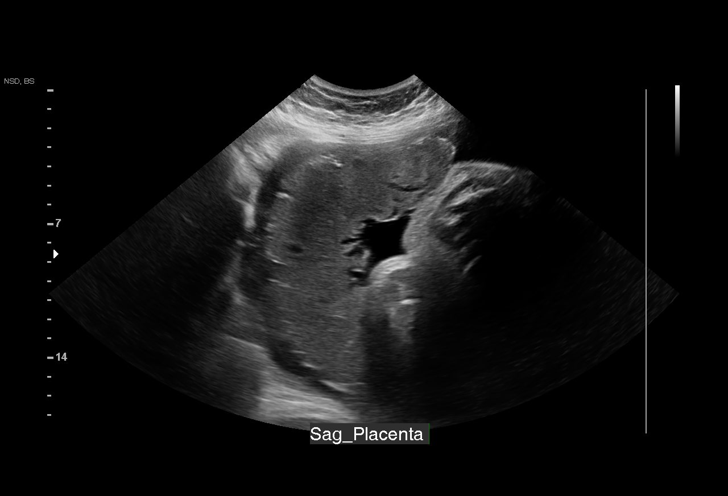
[im 5/27]
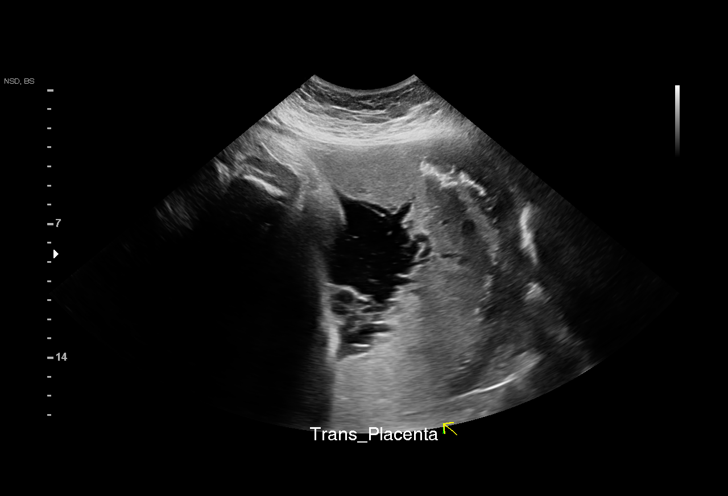
[im 7/27]
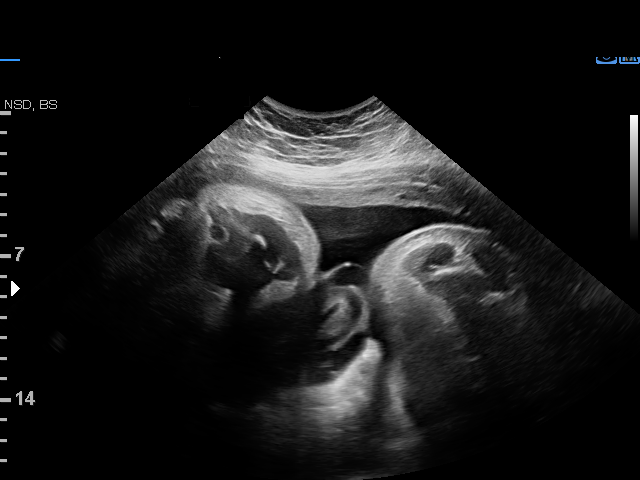
[im 9/27]
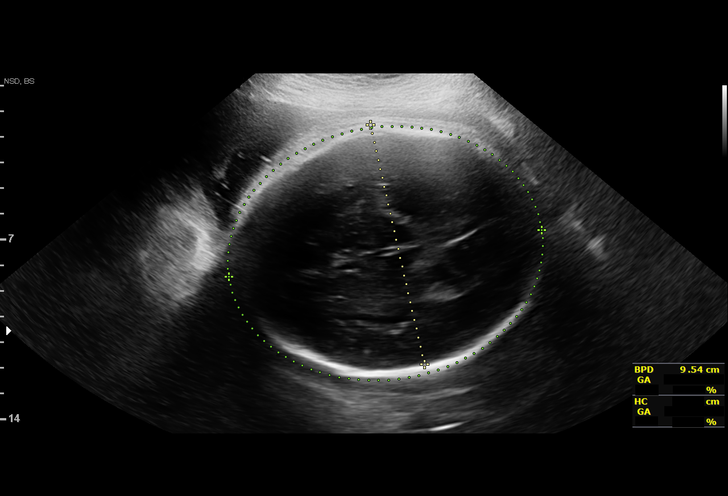
[im 11/27]
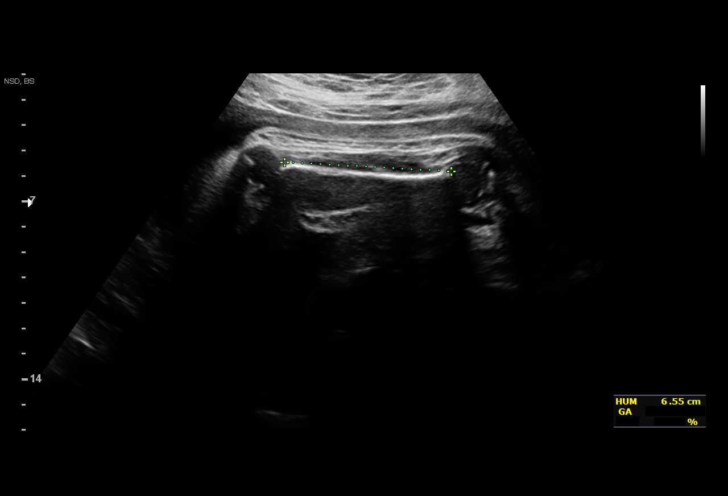
[im 13/27]
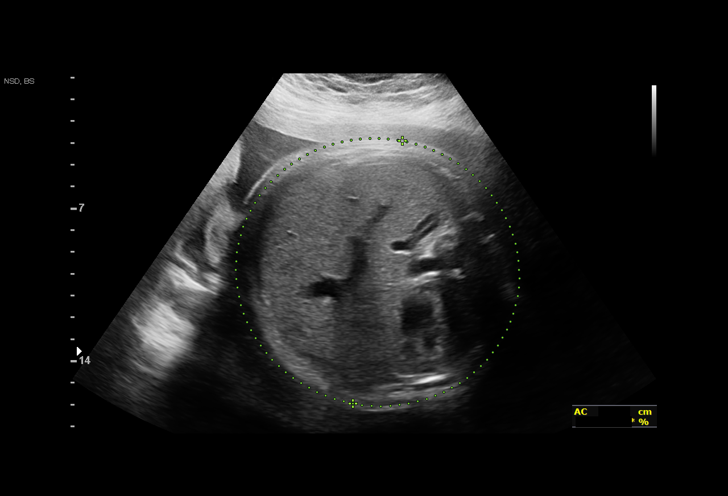
[im 15/27]
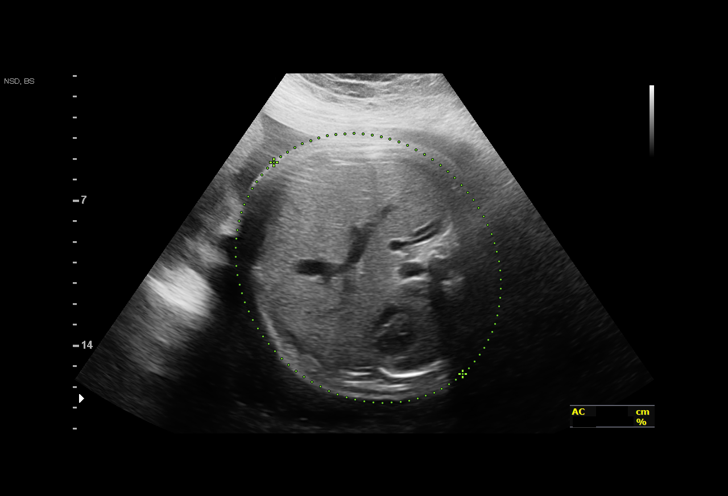
[im 17/27]
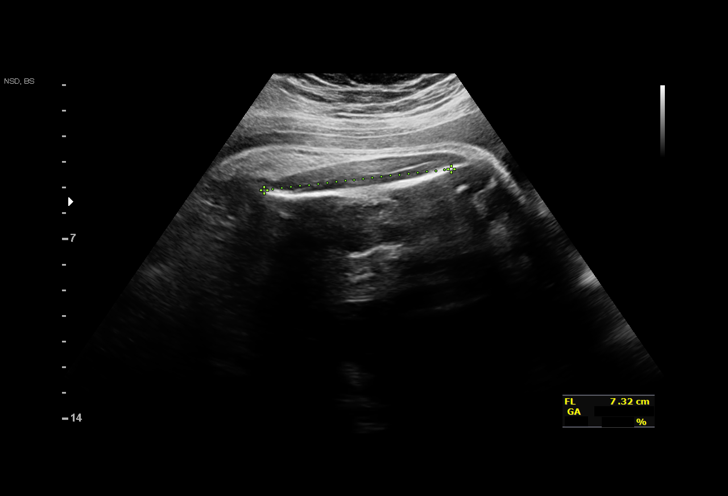
[im 19/27]
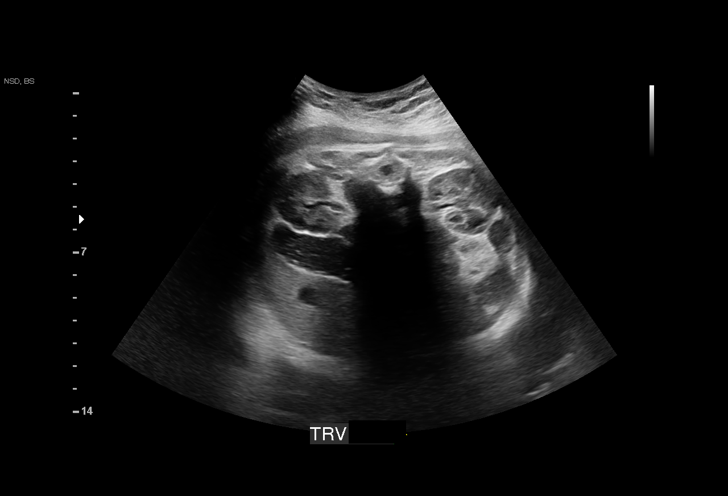
[im 21/27]
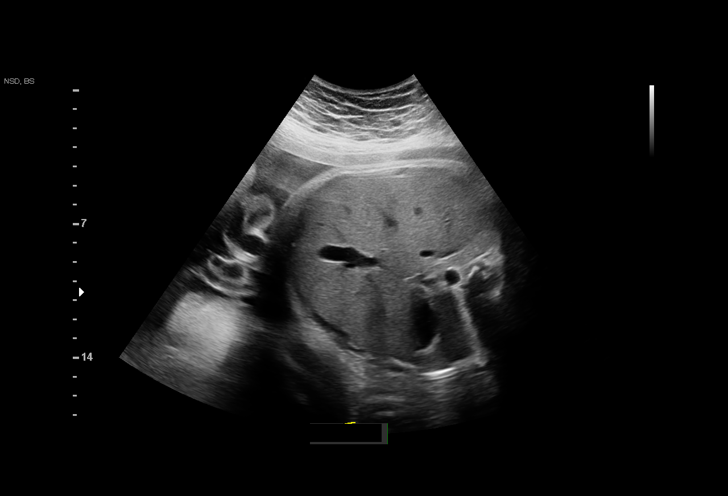
[im 23/27]
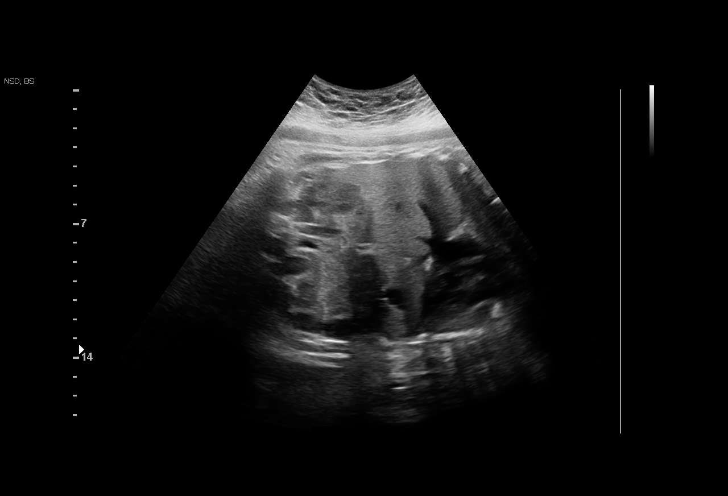
[im 25/27]
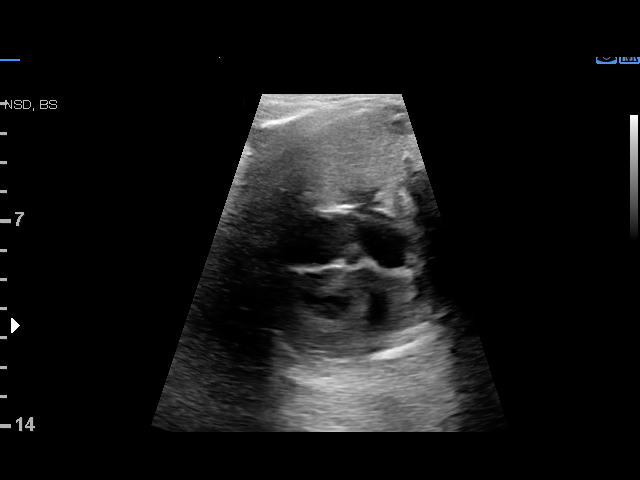
[im 27/27]
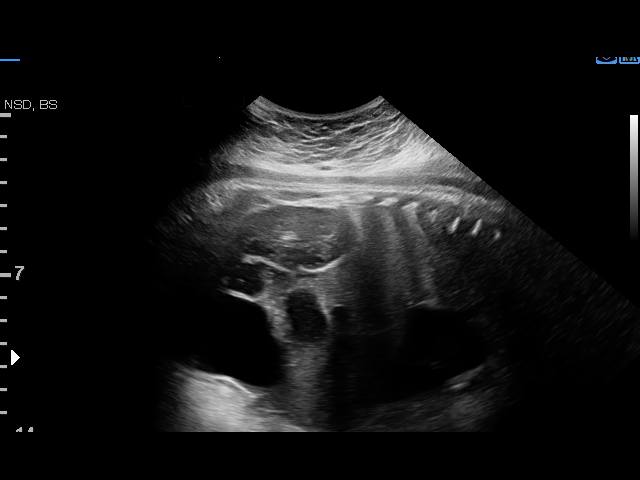

[14 of 27 positions shown; findings below may reference images not displayed]

POLSON
     STRESS                                            POLSON
 ----------------------------------------------------------------------

 ----------------------------------------------------------------------
Indications

  Hypertension - Chronic/Pre-existing
  (labetalol)
  38 weeks gestation of pregnancy
  Poor obstetric history: Prior fetal
  macrosomia, antepartum
  Obesity complicating pregnancy, third
  trimester
  Encounter for other antenatal screening
  follow-up
 ----------------------------------------------------------------------
Vital Signs

                                                Height:        5'4"
Fetal Evaluation

 Num Of Fetuses:          1
 Fetal Heart Rate(bpm):   146
 Cardiac Activity:        Observed
 Presentation:            Cephalic
 Placenta:                Left lateral

 Amniotic Fluid
 AFI FV:      Within normal limits

 AFI Sum(cm)     %Tile       Largest Pocket(cm)
 19.33           79
 RUQ(cm)       RLQ(cm)       LUQ(cm)        LLQ(cm)

Biophysical Evaluation

 Amniotic F.V:   Within normal limits       F. Tone:         Observed
 F. Movement:    Observed                   Score:           [DATE]
 F. Breathing:   Observed
Biometry

 BPD:      94.9  mm     G. Age:  38w 5d         78  %    CI:        73.68   %    70 - 86
                                                         FL/HC:       20.9  %    20.6 -
 HC:      351.2  mm     G. Age:  41w 0d         83  %    HC/AC:       0.88       0.87 -
 AC:       397   mm     G. Age:  N/A          > 97  %    FL/BPD:      77.3  %    71 - 87
 FL:       73.4  mm     G. Age:  37w 4d         27  %    FL/AC:       18.5  %    20 - 24
 HUM:      66.2  mm     G. Age:  38w 3d         70  %

 LV:        3.5  mm

 Est. FW:    6635   gm   9 lb 13 oz    > 90  %
OB History

 Gravidity:    3         Term:   2
 Living:       2
Gestational Age

 LMP:           38w 5d        Date:  06/13/18                 EDD:   03/20/19
 U/S Today:     39w 1d                                        EDD:   03/17/19
 Best:          38w 5d     Det. By:  LMP  (06/13/18)          EDD:   03/20/19
Anatomy

 Cranium:               Appears normal         Aortic Arch:            Previously seen
 Cavum:                 Previously seen        Ductal Arch:            Previously seen
 Ventricles:            Appears normal         Diaphragm:              Appears normal
 Choroid Plexus:        Previously seen        Stomach:                Appears normal, left
                                                                       sided
 Cerebellum:            Previously seen        Abdomen:                Previously seen
 Posterior Fossa:       Previously seen        Abdominal Wall:         Previously seen
 Nuchal Fold:           Previously seen        Cord Vessels:           Previously seen
 Face:                  Orbits and profile     Kidneys:                Appear normal
                        previously seen
 Lips:                  Previously seen        Bladder:                Appears normal
 Thoracic:              Appears normal         Spine:                  Previously seen
 Heart:                 Previously seen        Upper Extremities:      Previously seen
 RVOT:                  Previously seen        Lower Extremities:      Previously seen
 LVOT:                  Previously seen

 Other:  Technically difficult due to maternal habitus and fetal position. Heels
         and 5th digit prev visualized. Nasal bone prev visualized.
Cervix Uterus Adnexa

 Cervix
 Not visualized (advanced GA >12wks)
Impression

 Amniotic fluid is normal and good fetal activity is seen. Fetal
 growth is appropriate for gestational age. Antenatal testing is
 reassuring. BPP [DATE].
 Patient will be undergoing induction of labor on 03/13/19.
                 Gavarrete, Jomarie

## 2019-08-13 DIAGNOSIS — R0683 Snoring: Secondary | ICD-10-CM | POA: Diagnosis not present

## 2019-08-13 DIAGNOSIS — I10 Essential (primary) hypertension: Secondary | ICD-10-CM | POA: Diagnosis not present

## 2019-08-19 DIAGNOSIS — G4719 Other hypersomnia: Secondary | ICD-10-CM | POA: Diagnosis not present

## 2019-08-19 DIAGNOSIS — R0683 Snoring: Secondary | ICD-10-CM | POA: Diagnosis not present

## 2019-09-02 DIAGNOSIS — G4719 Other hypersomnia: Secondary | ICD-10-CM | POA: Diagnosis not present

## 2019-09-02 DIAGNOSIS — R0683 Snoring: Secondary | ICD-10-CM | POA: Diagnosis not present

## 2019-09-04 DIAGNOSIS — Z6841 Body Mass Index (BMI) 40.0 and over, adult: Secondary | ICD-10-CM | POA: Diagnosis not present

## 2019-09-04 DIAGNOSIS — R0602 Shortness of breath: Secondary | ICD-10-CM | POA: Diagnosis not present

## 2019-09-04 DIAGNOSIS — R0683 Snoring: Secondary | ICD-10-CM | POA: Diagnosis not present

## 2019-09-09 DIAGNOSIS — Z6841 Body Mass Index (BMI) 40.0 and over, adult: Secondary | ICD-10-CM | POA: Diagnosis not present

## 2019-09-09 DIAGNOSIS — Z131 Encounter for screening for diabetes mellitus: Secondary | ICD-10-CM | POA: Diagnosis not present

## 2019-09-09 DIAGNOSIS — Z136 Encounter for screening for cardiovascular disorders: Secondary | ICD-10-CM | POA: Diagnosis not present

## 2019-09-09 DIAGNOSIS — I10 Essential (primary) hypertension: Secondary | ICD-10-CM | POA: Diagnosis not present

## 2019-09-09 DIAGNOSIS — D649 Anemia, unspecified: Secondary | ICD-10-CM | POA: Diagnosis not present

## 2019-09-09 DIAGNOSIS — Z13220 Encounter for screening for lipoid disorders: Secondary | ICD-10-CM | POA: Diagnosis not present

## 2019-09-09 DIAGNOSIS — U070 Vaping-related disorder: Secondary | ICD-10-CM | POA: Diagnosis not present

## 2019-09-09 DIAGNOSIS — E559 Vitamin D deficiency, unspecified: Secondary | ICD-10-CM | POA: Diagnosis not present

## 2019-09-21 ENCOUNTER — Telehealth: Payer: Self-pay | Admitting: *Deleted

## 2019-09-21 NOTE — Telephone Encounter (Signed)
Pt called to office stating she would like to know if a physician here could fill out her medical form for Bariatric surgery for [facility].  Pt made aware she would need an appt with a provider to discuss her choices.  Pt made aware that is not something that our office will typically sign for.  She may be referred to PCP in order to complete her paperwork.  Pt advised she may contact an urgent care or try to establish care with a Primary physician.

## 2019-10-01 DIAGNOSIS — I10 Essential (primary) hypertension: Secondary | ICD-10-CM | POA: Diagnosis not present

## 2019-10-01 DIAGNOSIS — R0683 Snoring: Secondary | ICD-10-CM | POA: Diagnosis not present

## 2019-10-02 ENCOUNTER — Ambulatory Visit
Admission: EM | Admit: 2019-10-02 | Discharge: 2019-10-02 | Disposition: A | Discharge status: Home / Self Care | Payer: Medicaid Other | Attending: Emergency Medicine | Admitting: Emergency Medicine

## 2019-10-02 DIAGNOSIS — I10 Essential (primary) hypertension: Secondary | ICD-10-CM

## 2019-10-02 DIAGNOSIS — M25462 Effusion, left knee: Secondary | ICD-10-CM

## 2019-10-02 DIAGNOSIS — M25562 Pain in left knee: Secondary | ICD-10-CM | POA: Diagnosis not present

## 2019-10-02 MED ORDER — NAPROXEN 500 MG PO TABS: 500.0000 mg | ORAL_TABLET | Freq: Two times a day (BID) | ORAL | 0 refills | Status: DC

## 2019-10-02 NOTE — ED Triage Notes (Signed)
Pt c/o lt knee pain and "popping & grinding" for the past 2 days. States bent over at work and noticed her lt knee looks deformed. Pt denies injury.

## 2019-10-02 NOTE — Discharge Instructions (Addendum)
Recommend RICE: rest, ice, compression, elevation as needed for pain.   Cold therapy (ice packs) can be used to help swelling both after injury and after prolonged use of areas of chronic pain/aches. Important to follow-up with sports medicine next week for further evaluation. A follow-up with occupational therapy if any forms are needed for work.
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
## 2019-10-27 DIAGNOSIS — Z7189 Other specified counseling: Secondary | ICD-10-CM | POA: Diagnosis not present

## 2019-11-19 DIAGNOSIS — I10 Essential (primary) hypertension: Secondary | ICD-10-CM | POA: Diagnosis not present

## 2019-11-19 DIAGNOSIS — E559 Vitamin D deficiency, unspecified: Secondary | ICD-10-CM

## 2019-11-19 DIAGNOSIS — Z6841 Body Mass Index (BMI) 40.0 and over, adult: Secondary | ICD-10-CM | POA: Diagnosis not present

## 2019-11-19 HISTORY — DX: Vitamin D deficiency, unspecified: E55.9

## 2019-11-24 DIAGNOSIS — Z7189 Other specified counseling: Secondary | ICD-10-CM | POA: Diagnosis not present

## 2019-11-24 DIAGNOSIS — I10 Essential (primary) hypertension: Secondary | ICD-10-CM | POA: Diagnosis not present

## 2019-11-24 DIAGNOSIS — Z0189 Encounter for other specified special examinations: Secondary | ICD-10-CM | POA: Diagnosis not present

## 2019-11-24 DIAGNOSIS — Z01818 Encounter for other preprocedural examination: Secondary | ICD-10-CM | POA: Diagnosis not present

## 2019-11-24 DIAGNOSIS — Z6841 Body Mass Index (BMI) 40.0 and over, adult: Secondary | ICD-10-CM | POA: Diagnosis not present

## 2019-11-27 DIAGNOSIS — Z01818 Encounter for other preprocedural examination: Secondary | ICD-10-CM | POA: Diagnosis not present

## 2019-11-30 DIAGNOSIS — J45909 Unspecified asthma, uncomplicated: Secondary | ICD-10-CM | POA: Diagnosis not present

## 2019-11-30 DIAGNOSIS — Z79899 Other long term (current) drug therapy: Secondary | ICD-10-CM | POA: Diagnosis not present

## 2019-11-30 DIAGNOSIS — M549 Dorsalgia, unspecified: Secondary | ICD-10-CM | POA: Diagnosis not present

## 2019-11-30 DIAGNOSIS — Z880 Allergy status to penicillin: Secondary | ICD-10-CM | POA: Diagnosis not present

## 2019-11-30 DIAGNOSIS — F419 Anxiety disorder, unspecified: Secondary | ICD-10-CM | POA: Diagnosis not present

## 2019-11-30 DIAGNOSIS — I10 Essential (primary) hypertension: Secondary | ICD-10-CM | POA: Diagnosis not present

## 2019-11-30 DIAGNOSIS — R0683 Snoring: Secondary | ICD-10-CM | POA: Diagnosis not present

## 2019-11-30 DIAGNOSIS — Z6841 Body Mass Index (BMI) 40.0 and over, adult: Secondary | ICD-10-CM | POA: Diagnosis not present

## 2019-11-30 DIAGNOSIS — E785 Hyperlipidemia, unspecified: Secondary | ICD-10-CM | POA: Diagnosis not present

## 2019-11-30 DIAGNOSIS — Z87891 Personal history of nicotine dependence: Secondary | ICD-10-CM | POA: Diagnosis not present

## 2019-11-30 DIAGNOSIS — G4733 Obstructive sleep apnea (adult) (pediatric): Secondary | ICD-10-CM | POA: Diagnosis not present

## 2019-11-30 DIAGNOSIS — R5383 Other fatigue: Secondary | ICD-10-CM | POA: Diagnosis not present

## 2019-11-30 DIAGNOSIS — K219 Gastro-esophageal reflux disease without esophagitis: Secondary | ICD-10-CM | POA: Diagnosis not present

## 2019-11-30 DIAGNOSIS — M2550 Pain in unspecified joint: Secondary | ICD-10-CM | POA: Diagnosis not present

## 2019-12-04 DIAGNOSIS — I10 Essential (primary) hypertension: Secondary | ICD-10-CM | POA: Diagnosis not present

## 2020-01-14 DIAGNOSIS — I10 Essential (primary) hypertension: Secondary | ICD-10-CM | POA: Diagnosis not present

## 2020-01-14 DIAGNOSIS — Z01818 Encounter for other preprocedural examination: Secondary | ICD-10-CM | POA: Diagnosis not present

## 2020-01-17 DIAGNOSIS — Z01818 Encounter for other preprocedural examination: Secondary | ICD-10-CM | POA: Diagnosis not present

## 2020-01-20 DIAGNOSIS — E559 Vitamin D deficiency, unspecified: Secondary | ICD-10-CM | POA: Diagnosis not present

## 2020-01-20 DIAGNOSIS — Z6841 Body Mass Index (BMI) 40.0 and over, adult: Secondary | ICD-10-CM | POA: Diagnosis not present

## 2020-01-20 DIAGNOSIS — Z87891 Personal history of nicotine dependence: Secondary | ICD-10-CM | POA: Diagnosis not present

## 2020-01-20 DIAGNOSIS — K2950 Unspecified chronic gastritis without bleeding: Secondary | ICD-10-CM | POA: Diagnosis not present

## 2020-01-20 DIAGNOSIS — I10 Essential (primary) hypertension: Secondary | ICD-10-CM | POA: Diagnosis not present

## 2020-01-20 HISTORY — PX: LAPAROSCOPIC GASTRIC BAND REMOVAL WITH LAPAROSCOPIC GASTRIC SLEEVE RESECTION: SHX6498

## 2020-02-03 DIAGNOSIS — Z6839 Body mass index (BMI) 39.0-39.9, adult: Secondary | ICD-10-CM | POA: Diagnosis not present

## 2020-02-03 DIAGNOSIS — Z0000 Encounter for general adult medical examination without abnormal findings: Secondary | ICD-10-CM | POA: Diagnosis not present

## 2020-02-03 DIAGNOSIS — E669 Obesity, unspecified: Secondary | ICD-10-CM | POA: Diagnosis not present

## 2020-02-03 DIAGNOSIS — G5603 Carpal tunnel syndrome, bilateral upper limbs: Secondary | ICD-10-CM | POA: Diagnosis not present

## 2020-02-10 DIAGNOSIS — Z713 Dietary counseling and surveillance: Secondary | ICD-10-CM | POA: Diagnosis not present

## 2020-03-09 DIAGNOSIS — Z903 Acquired absence of stomach [part of]: Secondary | ICD-10-CM | POA: Insufficient documentation

## 2020-03-09 DIAGNOSIS — K912 Postsurgical malabsorption, not elsewhere classified: Secondary | ICD-10-CM | POA: Insufficient documentation

## 2020-07-13 DIAGNOSIS — Z20828 Contact with and (suspected) exposure to other viral communicable diseases: Secondary | ICD-10-CM | POA: Diagnosis not present

## 2020-07-13 DIAGNOSIS — J309 Allergic rhinitis, unspecified: Secondary | ICD-10-CM | POA: Diagnosis not present

## 2020-07-23 DIAGNOSIS — R070 Pain in throat: Secondary | ICD-10-CM | POA: Diagnosis not present

## 2020-07-23 DIAGNOSIS — Z20828 Contact with and (suspected) exposure to other viral communicable diseases: Secondary | ICD-10-CM | POA: Diagnosis not present

## 2020-07-23 DIAGNOSIS — J029 Acute pharyngitis, unspecified: Secondary | ICD-10-CM | POA: Diagnosis not present

## 2020-08-02 DIAGNOSIS — A088 Other specified intestinal infections: Secondary | ICD-10-CM | POA: Diagnosis not present

## 2020-08-02 DIAGNOSIS — B349 Viral infection, unspecified: Secondary | ICD-10-CM | POA: Diagnosis not present

## 2020-08-02 DIAGNOSIS — R509 Fever, unspecified: Secondary | ICD-10-CM | POA: Diagnosis not present

## 2020-09-28 DIAGNOSIS — Z1152 Encounter for screening for COVID-19: Secondary | ICD-10-CM | POA: Diagnosis not present

## 2020-09-28 DIAGNOSIS — Z20828 Contact with and (suspected) exposure to other viral communicable diseases: Secondary | ICD-10-CM | POA: Diagnosis not present

## 2020-10-04 DIAGNOSIS — K912 Postsurgical malabsorption, not elsewhere classified: Secondary | ICD-10-CM | POA: Diagnosis not present

## 2020-10-04 DIAGNOSIS — Z903 Acquired absence of stomach [part of]: Secondary | ICD-10-CM | POA: Diagnosis not present

## 2020-10-10 DIAGNOSIS — Z9889 Other specified postprocedural states: Secondary | ICD-10-CM | POA: Diagnosis not present

## 2020-12-19 DIAGNOSIS — J029 Acute pharyngitis, unspecified: Secondary | ICD-10-CM | POA: Diagnosis not present

## 2020-12-19 DIAGNOSIS — Z20828 Contact with and (suspected) exposure to other viral communicable diseases: Secondary | ICD-10-CM | POA: Diagnosis not present

## 2021-03-21 DIAGNOSIS — K912 Postsurgical malabsorption, not elsewhere classified: Secondary | ICD-10-CM | POA: Diagnosis not present

## 2021-03-21 DIAGNOSIS — Z903 Acquired absence of stomach [part of]: Secondary | ICD-10-CM | POA: Diagnosis not present

## 2021-03-23 ENCOUNTER — Emergency Department (HOSPITAL_COMMUNITY)
Admission: EM | Admit: 2021-03-23 | Discharge: 2021-03-24 | Disposition: A | Discharge status: Home / Self Care | Payer: Medicaid Other | Attending: Emergency Medicine | Admitting: Emergency Medicine

## 2021-03-23 DIAGNOSIS — I10 Essential (primary) hypertension: Secondary | ICD-10-CM | POA: Insufficient documentation

## 2021-03-23 DIAGNOSIS — Z20822 Contact with and (suspected) exposure to covid-19: Secondary | ICD-10-CM | POA: Insufficient documentation

## 2021-03-23 DIAGNOSIS — X58XXXA Exposure to other specified factors, initial encounter: Secondary | ICD-10-CM | POA: Diagnosis not present

## 2021-03-23 DIAGNOSIS — E860 Dehydration: Secondary | ICD-10-CM | POA: Insufficient documentation

## 2021-03-23 DIAGNOSIS — R55 Syncope and collapse: Secondary | ICD-10-CM | POA: Diagnosis not present

## 2021-03-23 DIAGNOSIS — R509 Fever, unspecified: Secondary | ICD-10-CM | POA: Insufficient documentation

## 2021-03-23 DIAGNOSIS — R42 Dizziness and giddiness: Secondary | ICD-10-CM | POA: Diagnosis not present

## 2021-03-23 DIAGNOSIS — S99921A Unspecified injury of right foot, initial encounter: Secondary | ICD-10-CM | POA: Diagnosis present

## 2021-03-23 DIAGNOSIS — R52 Pain, unspecified: Secondary | ICD-10-CM | POA: Diagnosis not present

## 2021-03-23 DIAGNOSIS — S91131A Puncture wound without foreign body of right great toe without damage to nail, initial encounter: Secondary | ICD-10-CM | POA: Insufficient documentation

## 2021-03-23 DIAGNOSIS — J45909 Unspecified asthma, uncomplicated: Secondary | ICD-10-CM | POA: Diagnosis not present

## 2021-03-23 DIAGNOSIS — Z87891 Personal history of nicotine dependence: Secondary | ICD-10-CM | POA: Diagnosis not present

## 2021-03-23 DIAGNOSIS — R202 Paresthesia of skin: Secondary | ICD-10-CM | POA: Diagnosis not present

## 2021-03-23 LAB — CBC WITH DIFFERENTIAL/PLATELET
Abs Immature Granulocytes: 0.01 10*3/uL (ref 0.00–0.07)
Basophils Absolute: 0 10*3/uL (ref 0.0–0.1)
Basophils Relative: 0 %
Eosinophils Absolute: 0.1 10*3/uL (ref 0.0–0.5)
Eosinophils Relative: 2 %
HCT: 38.9 % (ref 36.0–46.0)
Hemoglobin: 13.1 g/dL (ref 12.0–15.0)
Immature Granulocytes: 0 %
Lymphocytes Relative: 29 %
Lymphs Abs: 2.1 10*3/uL (ref 0.7–4.0)
MCH: 30.3 pg (ref 26.0–34.0)
MCHC: 33.7 g/dL (ref 30.0–36.0)
MCV: 89.8 fL (ref 80.0–100.0)
Monocytes Absolute: 0.7 10*3/uL (ref 0.1–1.0)
Monocytes Relative: 10 %
Neutro Abs: 4.4 10*3/uL (ref 1.7–7.7)
Neutrophils Relative %: 59 %
Platelets: 199 10*3/uL (ref 150–400)
RBC: 4.33 MIL/uL (ref 3.87–5.11)
RDW: 11.8 % (ref 11.5–15.5)
WBC: 7.3 10*3/uL (ref 4.0–10.5)
nRBC: 0 % (ref 0.0–0.2)

## 2021-03-23 LAB — CBG MONITORING, ED: Glucose-Capillary: 80 mg/dL (ref 70–99)

## 2021-03-23 LAB — I-STAT BETA HCG BLOOD, ED (MC, WL, AP ONLY): I-stat hCG, quantitative: <5 m[IU]/mL (ref <5)
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
## 2021-03-23 NOTE — ED Triage Notes (Signed)
Pt bib EMS from home due to syncopal episode that lasted about 30 seconds around 2000 tonight. Husband helped her to the ground, complaints of severe leg and feet cramping that started before the syncopal episode and is currently on going. Pt has been feeling dehydrated  And cold is going around her home. Has ben taking seasonal allergy medicine and tylenol.   Pt has been giving plasma 2 times a week for the past month. Recently had iron infusion. Hx of gastric sleeve March 2021.   Vitals WDL 12-lead WDL  18G placed in right AC. 150 cc's given en route.

## 2021-03-24 ENCOUNTER — Other Ambulatory Visit: Payer: Self-pay

## 2021-03-24 LAB — CK: Total CK: 54 U/L (ref 38–234)

## 2021-03-24 LAB — COMPREHENSIVE METABOLIC PANEL
ALT: 14 U/L (ref 0–44)
AST: 16 U/L (ref 15–41)
Albumin: 3.5 g/dL (ref 3.5–5.0)
Alkaline Phosphatase: 38 U/L (ref 38–126)
Anion gap: 8 (ref 5–15)
BUN: 13 mg/dL (ref 6–20)
CO2: 25 mmol/L (ref 22–32)
Calcium: 8.6 mg/dL — ABNORMAL LOW (ref 8.9–10.3)
Chloride: 104 mmol/L (ref 98–111)
Creatinine, Ser: 0.65 mg/dL (ref 0.44–1.00)
GFR, Estimated: >60 mL/min (ref >60)
Glucose, Bld: 84 mg/dL (ref 70–99)
Potassium: 3.4 mmol/L — ABNORMAL LOW (ref 3.5–5.1)
Sodium: 137 mmol/L (ref 135–145)
Total Bilirubin: 0.9 mg/dL (ref 0.3–1.2)
Total Protein: 6 g/dL — ABNORMAL LOW (ref 6.5–8.1)

## 2021-03-24 LAB — RESP PANEL BY RT-PCR (FLU A&B, COVID) ARPGX2
Influenza A by PCR: NEGATIVE
Influenza B by PCR: NEGATIVE
SARS Coronavirus 2 by RT PCR: NEGATIVE

## 2021-03-24 LAB — URINALYSIS, ROUTINE W REFLEX MICROSCOPIC
Bilirubin Urine: NEGATIVE
Glucose, UA: NEGATIVE mg/dL
Ketones, ur: 20 mg/dL — AB
Leukocytes,Ua: NEGATIVE
Nitrite: NEGATIVE
Protein, ur: 30 mg/dL — AB
Specific Gravity, Urine: 1.026 (ref 1.005–1.030)
pH: 6 (ref 5.0–8.0)

## 2021-03-24 LAB — VITAMIN B12: Vitamin B-12: 198 pg/mL (ref 180–914)

## 2021-03-24 NOTE — ED Notes (Signed)
Pt able to ambulate to the bathroom with no assistance.

## 2021-03-24 NOTE — ED Notes (Signed)
DC instructions reviewed with pt. Pt verbalized instructions.  Pt DC. 

## 2021-03-24 NOTE — Discharge Instructions (Signed)
Thank you for allowing me to care for you today in the Emergency Department.   Your work-up today was overall reassuring other than some mild dehydration.  Suspect this was a vagal reaction from the insect sting on your toe since your symptoms began shortly after this occurred.  Make sure that you resume your home vitamins and supplements.  Continue to follow-up with your outpatient primary care team as needed.  I try to find ways to increase your fluid intake and daily.  Try setting an alarm every 30 minutes to an hour to have water to avoid dehydration.  Return the emergency department if you have an episode where you pass out, if you develop respiratory distress or chest pain, uncontrollable vomiting, or other new, concerning symptoms.

## 2021-04-05 DIAGNOSIS — J0140 Acute pansinusitis, unspecified: Secondary | ICD-10-CM | POA: Diagnosis not present

## 2021-04-05 DIAGNOSIS — R0981 Nasal congestion: Secondary | ICD-10-CM | POA: Diagnosis not present

## 2021-04-05 DIAGNOSIS — R519 Headache, unspecified: Secondary | ICD-10-CM | POA: Diagnosis not present

## 2021-04-05 DIAGNOSIS — R059 Cough, unspecified: Secondary | ICD-10-CM | POA: Diagnosis not present

## 2021-08-28 ENCOUNTER — Other Ambulatory Visit: Payer: Self-pay

## 2021-08-28 ENCOUNTER — Other Ambulatory Visit (HOSPITAL_COMMUNITY)
Admission: RE | Admit: 2021-08-28 | Discharge: 2021-08-28 | Disposition: A | Discharge status: Home / Self Care | Payer: Medicaid Other | Source: Ambulatory Visit

## 2021-08-28 ENCOUNTER — Ambulatory Visit (INDEPENDENT_AMBULATORY_CARE_PROVIDER_SITE_OTHER): Payer: Medicaid Other

## 2021-08-28 VITALS — BP 135/91 | HR 92 | Temp 98.4°F | Wt 146.0 lb

## 2021-08-28 DIAGNOSIS — Z01419 Encounter for gynecological examination (general) (routine) without abnormal findings: Secondary | ICD-10-CM

## 2021-08-28 DIAGNOSIS — Z113 Encounter for screening for infections with a predominantly sexual mode of transmission: Secondary | ICD-10-CM | POA: Insufficient documentation

## 2021-08-28 DIAGNOSIS — Z3009 Encounter for other general counseling and advice on contraception: Secondary | ICD-10-CM | POA: Diagnosis not present

## 2021-08-28 NOTE — Patient Instructions (Signed)
[email or web address]

## 2021-08-28 NOTE — Progress Notes (Signed)
Subjective:     Tara Blake is a 25 y.o. female here at [facility] for a routine exam.  Current complaints: bumps on vagina and groin area for 1 week. No itching, bleeding, or drainage, but painful/swollen. Also has irregular bleeding with Nexplanon. Considering other birth control options  Personal health questionnaire reviewed: yes.  Do you have a primary care provider? yes Do you feel safe at home? yes  [employer] [facility] from 03/26/2019 in [facility]  PHQ-2 Total Score 1       Health Maintenance Due  Topic Date Due   COVID-19 Vaccine (1) Never done   HPV VACCINES (1 - 2-dose series) Never done   Hepatitis C Screening  Never done   PAP-Cervical Cytology Screening  03/13/2021   PAP SMEAR-Modifier  03/13/2021   INFLUENZA VACCINE  05/29/2021    Risk factors for chronic health problems: Smoking: no Alchohol/how much: no Illicit drug use: no Exercise: no Pt BMI: Body mass index is 25.06 kg/m.   Gynecologic History Patient's last menstrual period was 08/24/2021 (exact date). Contraception: Nexplanon Sexual health: no issues Last Pap: n/a Last mammogram: n/a  Obstetric History OB History  Gravida Para Term Preterm AB Living  3 3 3     3   SAB IAB Ectopic Multiple Live Births        0 3    # Outcome Date GA Lbr Len/2nd Weight Sex Delivery Anes PTL Lv  3 Term 03/13/19 [redacted]w[redacted]d 01:18 / 00:11 9 lb 10.5 oz (4.38 kg) M Vag-Spont EPI  LIV  2 Term 01/08/15   9 lb 6 oz (4.252 kg) M Vag-Spont EPI  LIV  1 Term 10/02/13   10 lb 4 oz (4.649 kg) M Vag-Spont   LIV    The following portions of the patient's history were reviewed and updated as appropriate: allergies, current medications, past family history, past medical history, past social history, past surgical history, and problem list.  Review of Systems Pertinent items are noted in HPI.    Objective:   BP (!) 135/91 (BP Location: Left Arm, Patient Position: Sitting,  Cuff Size: Normal)   Pulse 92   Temp 98.4 F (36.9 C) (Oral)   Wt 146 lb (66.2 kg)   LMP 08/24/2021 (Exact Date)   BMI 25.06 kg/m  VS reviewed, nursing note reviewed,  Constitutional: well developed, well nourished, no distress HEENT: normocephalic, neck supple CV: normal rate Pulm/chest wall: normal effort Breast Exam: Deferred with low risks and shared decision making, discussed recommendation to start mammogram between 40-50 yo/ exam  Abdomen: soft Neuro: alert and oriented x 3 Skin: warm, dry Psych: affect normal Pelvic exam: Performed: Cervix pink, visually closed, without lesion, scant blood, vaginal walls and external genitalia normal. No bumps visualized or palpated.  Bimanual exam: Cervix 0/long/high, firm, anterior, neg CMT, uterus nontender, nonenlarged, adnexa without tenderness, enlargement, or mass   Assessment/Plan:  1. Well woman exam with routine gynecological exam - Doing well.  - Bumps likely hair follicle. Recommend avoiding shaving. If bumps reappear can call office back or see PCP - Cytology - PAP( [facility]) - Cervicovaginal ancillary only  2. Screen for STD (sexually transmitted disease) - Requests STD testing  - Cytology - PAP( [facility]) - RPR - HIV Antibody (routine testing w rflx) - Hepatitis C Antibody - Hepatitis B Surface AntiGEN - Cervicovaginal ancillary only  3. Birth control counseling - Irregular bleeding with Nexplanon. Considering other options as she does  not like the irregular bleeding. I reviewed other options including IUD, pills, rings, patches and Depo - Patient wants to do more research and decide on method. Information provided in AVS.    Follow up in: 1  year for well woman exam  or as needed.   Brand Males, CNM 08/28/21 4:09 PM

## 2021-08-29 LAB — CERVICOVAGINAL ANCILLARY ONLY
Bacterial Vaginitis (gardnerella): POSITIVE — AB
Candida Glabrata: NEGATIVE
Candida Vaginitis: NEGATIVE
Chlamydia: NEGATIVE
Comment: NEGATIVE
Comment: NEGATIVE
Comment: NEGATIVE
Comment: NEGATIVE
Comment: NEGATIVE
Comment: NORMAL
Neisseria Gonorrhea: NEGATIVE
Trichomonas: NEGATIVE

## 2021-08-29 LAB — RPR: RPR Ser Ql: NONREACTIVE

## 2021-08-29 LAB — HEPATITIS B SURFACE ANTIGEN: Hepatitis B Surface Ag: NEGATIVE

## 2021-08-29 LAB — HEPATITIS C ANTIBODY: Hep C Virus Ab: <0.1 s/co ratio (ref 0.0–0.9)

## 2021-08-29 LAB — HIV ANTIBODY (ROUTINE TESTING W REFLEX): HIV Screen 4th Generation wRfx: NONREACTIVE

## 2021-08-31 ENCOUNTER — Other Ambulatory Visit: Payer: Self-pay

## 2021-08-31 DIAGNOSIS — B9689 Other specified bacterial agents as the cause of diseases classified elsewhere: Secondary | ICD-10-CM

## 2021-08-31 DIAGNOSIS — N760 Acute vaginitis: Secondary | ICD-10-CM

## 2021-08-31 MED ORDER — METRONIDAZOLE 500 MG PO TABS: 500.0000 mg | ORAL_TABLET | Freq: Two times a day (BID) | ORAL | 0 refills | Status: DC

## 2021-09-04 LAB — CYTOLOGY - PAP
Comment: NEGATIVE
Diagnosis: UNDETERMINED — AB
High risk HPV: NEGATIVE

## 2021-09-12 DIAGNOSIS — R051 Acute cough: Secondary | ICD-10-CM | POA: Diagnosis not present

## 2021-09-12 DIAGNOSIS — J111 Influenza due to unidentified influenza virus with other respiratory manifestations: Secondary | ICD-10-CM | POA: Diagnosis not present

## 2021-09-12 DIAGNOSIS — R509 Fever, unspecified: Secondary | ICD-10-CM | POA: Diagnosis not present

## 2021-09-12 DIAGNOSIS — J45909 Unspecified asthma, uncomplicated: Secondary | ICD-10-CM | POA: Diagnosis not present

## 2021-10-29 DIAGNOSIS — Z8616 Personal history of COVID-19: Secondary | ICD-10-CM

## 2021-10-29 HISTORY — DX: Personal history of COVID-19: Z86.16

## 2021-11-04 DIAGNOSIS — R519 Headache, unspecified: Secondary | ICD-10-CM | POA: Diagnosis not present

## 2021-11-04 DIAGNOSIS — Z20828 Contact with and (suspected) exposure to other viral communicable diseases: Secondary | ICD-10-CM | POA: Diagnosis not present

## 2021-11-04 DIAGNOSIS — R0981 Nasal congestion: Secondary | ICD-10-CM | POA: Diagnosis not present

## 2021-11-04 DIAGNOSIS — R509 Fever, unspecified: Secondary | ICD-10-CM | POA: Diagnosis not present

## 2021-11-04 DIAGNOSIS — M791 Myalgia, unspecified site: Secondary | ICD-10-CM | POA: Diagnosis not present

## 2021-12-04 DIAGNOSIS — F331 Major depressive disorder, recurrent, moderate: Secondary | ICD-10-CM | POA: Diagnosis not present

## 2021-12-04 DIAGNOSIS — F4312 Post-traumatic stress disorder, chronic: Secondary | ICD-10-CM | POA: Diagnosis not present

## 2021-12-04 DIAGNOSIS — F411 Generalized anxiety disorder: Secondary | ICD-10-CM | POA: Diagnosis not present

## 2021-12-18 DIAGNOSIS — F411 Generalized anxiety disorder: Secondary | ICD-10-CM | POA: Diagnosis not present

## 2021-12-18 DIAGNOSIS — F4312 Post-traumatic stress disorder, chronic: Secondary | ICD-10-CM | POA: Diagnosis not present

## 2021-12-18 DIAGNOSIS — F331 Major depressive disorder, recurrent, moderate: Secondary | ICD-10-CM | POA: Diagnosis not present

## 2021-12-27 DIAGNOSIS — Z903 Acquired absence of stomach [part of]: Secondary | ICD-10-CM | POA: Diagnosis not present

## 2021-12-27 DIAGNOSIS — K912 Postsurgical malabsorption, not elsewhere classified: Secondary | ICD-10-CM | POA: Diagnosis not present

## 2022-01-01 DIAGNOSIS — M792 Neuralgia and neuritis, unspecified: Secondary | ICD-10-CM | POA: Diagnosis not present

## 2022-01-01 DIAGNOSIS — Q828 Other specified congenital malformations of skin: Secondary | ICD-10-CM | POA: Diagnosis not present

## 2022-01-01 DIAGNOSIS — L600 Ingrowing nail: Secondary | ICD-10-CM | POA: Diagnosis not present

## 2022-01-01 DIAGNOSIS — M21622 Bunionette of left foot: Secondary | ICD-10-CM | POA: Diagnosis not present

## 2022-01-01 DIAGNOSIS — M2012 Hallux valgus (acquired), left foot: Secondary | ICD-10-CM | POA: Diagnosis not present

## 2022-01-02 DIAGNOSIS — K912 Postsurgical malabsorption, not elsewhere classified: Secondary | ICD-10-CM | POA: Diagnosis not present

## 2022-01-02 DIAGNOSIS — Z903 Acquired absence of stomach [part of]: Secondary | ICD-10-CM | POA: Diagnosis not present

## 2022-01-09 DIAGNOSIS — F4312 Post-traumatic stress disorder, chronic: Secondary | ICD-10-CM | POA: Diagnosis not present

## 2022-01-09 DIAGNOSIS — F411 Generalized anxiety disorder: Secondary | ICD-10-CM | POA: Diagnosis not present

## 2022-01-09 DIAGNOSIS — F331 Major depressive disorder, recurrent, moderate: Secondary | ICD-10-CM | POA: Diagnosis not present

## 2022-01-09 NOTE — Progress Notes (Signed)
? ?GYNECOLOGY OFFICE VISIT NOTE ? ?History:  ? Tara Blake is a 26 y.o. [phone] here today for BTB on Nexplanon and dyspareunia. She has had bloating with her Nexplanon and is concerned about ovarian cancer (she has GAD which we discussed wouldn't cause her symptoms but would cause her more concern). She would feel reassured with an [place]. These symptoms are new for her in the last 6-8  months but also this is her second Nexplanon.  ? ?She also notes pain with intercourse for that amount of time. She has pain with sex all of the time. She doesn't have any urinary or bowel symptoms that she is aware of.  ? ?She does have h/o migraines as noted below.  ? ? ?  ?Past Medical History:  ?Diagnosis Date  ? Anemia   ? Asthma   ? Depression   ? Essential hypertension   ? Hypertension   ? Migraine   ? MRSA infection 2015  ? in leg  ? Personal history of self-harm   ? UTI (urinary tract infection)   ? ? ?Past Surgical History:  ?Procedure Laterality Date  ? TONSILLECTOMY    ? WISDOM TOOTH EXTRACTION    ? ? ?The following portions of the patient's history were reviewed and updated as appropriate: allergies, current medications, past family history, past medical history, past social history, past surgical history and problem list.  ? ?Health Maintenance:   ?ASCUS pap and negative HRHPV on 07/2021 ?Diagnosis  ?Date Value Ref Range Status  ?08/28/2021 (A)  Final  ? - Atypical squamous cells of undetermined significance (ASC-US)  ?  ? ?Review of Systems:  ?Pertinent items noted in HPI and remainder of comprehensive ROS otherwise negative. ? ?Physical Exam:  ?BP (!) 134/93   Pulse 88   Ht 5\' 4"  (1.626 m)   Wt 148 lb (67.1 kg)   LMP 01/05/2022   BMI 25.40 kg/m?  ?CONSTITUTIONAL: Well-developed, well-nourished female in no acute distress.  ?HEENT:  Normocephalic, atraumatic. External right and left ear normal. No scleral icterus.  ?NECK: Normal range of motion, supple, no masses noted on observation ?SKIN: No rash noted. Not  diaphoretic. No erythema. No pallor. ?MUSCULOSKELETAL: Normal range of motion. No edema noted. ?NEUROLOGIC: Alert and oriented to person, place, and time. Normal muscle tone coordination. No cranial nerve deficit noted. ?PSYCHIATRIC: Normal mood and affect. Normal behavior. Normal judgment and thought content. ? ?CARDIOVASCULAR: Normal heart rate noted ?RESPIRATORY: Effort and breath sounds normal, no problems with respiration noted ?ABDOMEN: No masses noted. No other overt distention noted.  Suprapubic tenderness  ? ?PELVIC: Normal appearing external genitalia; Qtip test done and overall negative - mild tenderness at urethra but not the same pain as with intercourse normal appearing urethral meatus; normal appearing vaginal mucosa and cervix.  No abnormal discharge noted. Levator and obturator muscles moderately tender Normal uterine size, no other palpable masses, uterus/cervix moderately tender, bladder was exquisitely tender ?Performed in the presence of a chaperone ? ?Labs and Imaging ?No results found for this or any previous visit (from the past 168 hour(s)). ?No results found.  ?Assessment and Plan:  ?Jolanta was seen today for gynecologic exam. ? ?Diagnoses and all orders for this visit: ? ?Dyspareunia, female ?- Suspect this is related to her bladder pain. Some concomittant pelvic floor dysfunction but most likely will resolve with improvement in bladder symptoms ?- We will also check Cx as she has had BV in the past but I think this is a separate issue  ?-  Cervicovaginal ancillary only( [place]) ?-     POCT Urinalysis Dipstick ?-     Ambulatory referral to Urogynecology ?-     US PELVIC COMPLETE WITH TRANSVAGINAL; Future ? ?Breakthrough bleeding on Nexplanon ?-     US PELVIC COMPLETE WITH TRANSVAGINAL; Future ?- Will check for pt reasurance ? ?Bladder pain ?- Concern for IC especially on review of diet: Dr. Reino Kent, sometimes diet, Gatorade that is sometimes diet, she likes spicy food, tomatoes and  drinks OJ daily.  ?- Reviewed importance of water for bladder inflammation.  ?- We will do Urine culture due to blood but she will also review hand out and make some adjustments.  ?- Will also do referral to urogyn in case diet changes are insufficient.  ?-     Cervicovaginal ancillary only( [place]) ?-     POCT Urinalysis Dipstick ?-     Ambulatory referral to Urogynecology ?-     Urine Culture ? ?Routine preventative health maintenance measures emphasized. ?Please refer to After Visit Summary for other counseling recommendations.  ? ?Return if symptoms worsen or fail to improve. ? ?Milas Hock, MD, FACOG ?Obstetrician Heritage manager, Faculty Practice ?Center for [employer], [facility] ? ? ? ? ? ?

## 2022-01-15 DIAGNOSIS — F4312 Post-traumatic stress disorder, chronic: Secondary | ICD-10-CM | POA: Diagnosis not present

## 2022-01-15 DIAGNOSIS — M2012 Hallux valgus (acquired), left foot: Secondary | ICD-10-CM | POA: Diagnosis not present

## 2022-01-15 DIAGNOSIS — Q828 Other specified congenital malformations of skin: Secondary | ICD-10-CM | POA: Diagnosis not present

## 2022-01-15 DIAGNOSIS — B351 Tinea unguium: Secondary | ICD-10-CM | POA: Diagnosis not present

## 2022-01-15 DIAGNOSIS — M21622 Bunionette of left foot: Secondary | ICD-10-CM | POA: Diagnosis not present

## 2022-01-15 DIAGNOSIS — L600 Ingrowing nail: Secondary | ICD-10-CM | POA: Diagnosis not present

## 2022-01-15 DIAGNOSIS — F411 Generalized anxiety disorder: Secondary | ICD-10-CM | POA: Diagnosis not present

## 2022-01-15 DIAGNOSIS — M792 Neuralgia and neuritis, unspecified: Secondary | ICD-10-CM | POA: Diagnosis not present

## 2022-01-15 DIAGNOSIS — F331 Major depressive disorder, recurrent, moderate: Secondary | ICD-10-CM | POA: Diagnosis not present

## 2022-01-16 ENCOUNTER — Ambulatory Visit: Payer: Medicaid Other | Admitting: Obstetrics and Gynecology

## 2022-01-16 ENCOUNTER — Encounter: Payer: Self-pay | Admitting: Obstetrics and Gynecology

## 2022-01-16 ENCOUNTER — Other Ambulatory Visit (HOSPITAL_COMMUNITY)
Admission: RE | Admit: 2022-01-16 | Discharge: 2022-01-16 | Disposition: A | Discharge status: Home / Self Care | Payer: Medicaid Other | Source: Ambulatory Visit | Attending: Obstetrics and Gynecology | Admitting: Obstetrics and Gynecology

## 2022-01-16 ENCOUNTER — Other Ambulatory Visit: Payer: Self-pay

## 2022-01-16 VITALS — BP 134/93 | HR 88 | Ht 64.0 in | Wt 148.0 lb

## 2022-01-16 DIAGNOSIS — N941 Unspecified dyspareunia: Secondary | ICD-10-CM | POA: Insufficient documentation

## 2022-01-16 DIAGNOSIS — Z975 Presence of (intrauterine) contraceptive device: Secondary | ICD-10-CM

## 2022-01-16 DIAGNOSIS — R3989 Other symptoms and signs involving the genitourinary system: Secondary | ICD-10-CM | POA: Diagnosis not present

## 2022-01-16 DIAGNOSIS — N760 Acute vaginitis: Secondary | ICD-10-CM | POA: Diagnosis not present

## 2022-01-16 DIAGNOSIS — N921 Excessive and frequent menstruation with irregular cycle: Secondary | ICD-10-CM

## 2022-01-16 DIAGNOSIS — B9689 Other specified bacterial agents as the cause of diseases classified elsewhere: Secondary | ICD-10-CM

## 2022-01-16 LAB — POCT URINALYSIS DIPSTICK
Bilirubin, UA: NEGATIVE
Blood, UA: POSITIVE
Glucose, UA: NEGATIVE
Ketones, UA: NEGATIVE
Nitrite, UA: NEGATIVE
Protein, UA: POSITIVE — AB
Spec Grav, UA: 1.015 (ref 1.010–1.025)
Urobilinogen, UA: 0.2 E.U./dL
pH, UA: 7.5 (ref 5.0–8.0)

## 2022-01-16 NOTE — Progress Notes (Addendum)
26 y.o GYN presents for irregular periods,  ?C/o bleeding 2x monthly, heavy periods, bloating, cramping 5-9/10, urinary frequency and painful intercourse.  Patient has the Nexplanon 2+ years now. ? ?Last PAP 08/28/21 ?

## 2022-01-17 LAB — CERVICOVAGINAL ANCILLARY ONLY
Bacterial Vaginitis (gardnerella): POSITIVE — AB
Candida Glabrata: NEGATIVE
Candida Vaginitis: NEGATIVE
Chlamydia: NEGATIVE
Comment: NEGATIVE
Comment: NEGATIVE
Comment: NEGATIVE
Comment: NEGATIVE
Comment: NEGATIVE
Comment: NORMAL
Neisseria Gonorrhea: NEGATIVE
Trichomonas: NEGATIVE

## 2022-01-18 DIAGNOSIS — F411 Generalized anxiety disorder: Secondary | ICD-10-CM | POA: Diagnosis not present

## 2022-01-18 DIAGNOSIS — F331 Major depressive disorder, recurrent, moderate: Secondary | ICD-10-CM | POA: Diagnosis not present

## 2022-01-18 MED ORDER — METRONIDAZOLE 500 MG PO TABS: 500.0000 mg | ORAL_TABLET | Freq: Two times a day (BID) | ORAL | 0 refills | Status: DC

## 2022-01-18 NOTE — Addendum Note (Signed)
Addended by: Milas Hock A on: 01/18/2022 08:15 AM ? ? Modules accepted: Orders ? ?

## 2022-01-19 DIAGNOSIS — K912 Postsurgical malabsorption, not elsewhere classified: Secondary | ICD-10-CM | POA: Diagnosis not present

## 2022-01-19 DIAGNOSIS — Z903 Acquired absence of stomach [part of]: Secondary | ICD-10-CM | POA: Diagnosis not present

## 2022-01-24 ENCOUNTER — Other Ambulatory Visit: Payer: Self-pay

## 2022-01-24 ENCOUNTER — Ambulatory Visit
Admission: RE | Admit: 2022-01-24 | Discharge: 2022-01-24 | Disposition: A | Discharge status: Home / Self Care | Payer: Medicaid Other | Source: Ambulatory Visit | Attending: Obstetrics and Gynecology | Admitting: Obstetrics and Gynecology

## 2022-01-24 DIAGNOSIS — N921 Excessive and frequent menstruation with irregular cycle: Secondary | ICD-10-CM | POA: Diagnosis not present

## 2022-01-24 DIAGNOSIS — N939 Abnormal uterine and vaginal bleeding, unspecified: Secondary | ICD-10-CM | POA: Diagnosis not present

## 2022-01-24 DIAGNOSIS — Z975 Presence of (intrauterine) contraceptive device: Secondary | ICD-10-CM | POA: Diagnosis not present

## 2022-01-24 DIAGNOSIS — N941 Unspecified dyspareunia: Secondary | ICD-10-CM | POA: Diagnosis not present

## 2022-01-24 DIAGNOSIS — R634 Abnormal weight loss: Secondary | ICD-10-CM | POA: Diagnosis not present

## 2022-01-24 DIAGNOSIS — R140 Abdominal distension (gaseous): Secondary | ICD-10-CM | POA: Diagnosis not present

## 2022-01-26 ENCOUNTER — Other Ambulatory Visit: Payer: Self-pay

## 2022-01-26 ENCOUNTER — Encounter (HOSPITAL_BASED_OUTPATIENT_CLINIC_OR_DEPARTMENT_OTHER): Payer: Self-pay | Admitting: Podiatry

## 2022-01-26 DIAGNOSIS — D649 Anemia, unspecified: Secondary | ICD-10-CM

## 2022-01-26 HISTORY — DX: Anemia, unspecified: D64.9

## 2022-01-26 NOTE — Progress Notes (Addendum)
Spoke w/ via phone for pre-op interview---pt ?Lab needs dos----  urine poct preg, surgery orders req dr Theresia Bough epic ib             ?Lab results------ekg 03-23-2021 chart/epic ?COVID test -----patient states asymptomatic no test needed ?Arrive at -------1200 pm 02-01-2022  ?NPO after MN NO Solid Food.  Clear liquids from MN until---1100 am  ?Med rec completed ?Medications to take morning of surgery -----ventolin inhaler prn/bring inhaler, hydroxyazine, buspirone ?Diabetic medication -----n/a ?Patient instructed no nail polish to be worn day of surgery ?Patient instructed to bring photo id and insurance card day of surgery ?Patient aware to have Driver (ride ) / caregiver    for 24 hours after surgery  ?Patient Special Instructions -----none ?Pre-Op special Istructions -----none ?Patient verbalized understanding of instructions that were given at this phone interview. ?Patient denies shortness of breath, chest pain, fever, cough at this phone interview.  ? ?Addendum H & P /medical clearance dr Ashok Cordia hite dated 01-29-2022 on chart for 02-01-2022 surgery. ?

## 2022-01-29 DIAGNOSIS — F4312 Post-traumatic stress disorder, chronic: Secondary | ICD-10-CM | POA: Diagnosis not present

## 2022-01-29 DIAGNOSIS — F411 Generalized anxiety disorder: Secondary | ICD-10-CM | POA: Diagnosis not present

## 2022-01-29 DIAGNOSIS — F331 Major depressive disorder, recurrent, moderate: Secondary | ICD-10-CM | POA: Diagnosis not present

## 2022-01-29 DIAGNOSIS — Z01818 Encounter for other preprocedural examination: Secondary | ICD-10-CM | POA: Diagnosis not present

## 2022-01-29 DIAGNOSIS — G8929 Other chronic pain: Secondary | ICD-10-CM | POA: Diagnosis not present

## 2022-01-29 DIAGNOSIS — M21619 Bunion of unspecified foot: Secondary | ICD-10-CM | POA: Diagnosis not present

## 2022-01-29 DIAGNOSIS — M545 Low back pain, unspecified: Secondary | ICD-10-CM | POA: Diagnosis not present

## 2022-02-01 ENCOUNTER — Encounter (HOSPITAL_BASED_OUTPATIENT_CLINIC_OR_DEPARTMENT_OTHER): Payer: Self-pay | Admitting: Podiatry

## 2022-02-01 ENCOUNTER — Other Ambulatory Visit: Payer: Self-pay

## 2022-02-01 ENCOUNTER — Ambulatory Visit (HOSPITAL_BASED_OUTPATIENT_CLINIC_OR_DEPARTMENT_OTHER): Payer: Medicaid Other | Admitting: Anesthesiology

## 2022-02-01 ENCOUNTER — Ambulatory Visit (HOSPITAL_BASED_OUTPATIENT_CLINIC_OR_DEPARTMENT_OTHER): Payer: Medicaid Other

## 2022-02-01 ENCOUNTER — Encounter (HOSPITAL_BASED_OUTPATIENT_CLINIC_OR_DEPARTMENT_OTHER)
Admission: RE | Disposition: A | Discharge status: Home / Self Care | Payer: Self-pay | Source: Home / Self Care | Attending: Podiatry

## 2022-02-01 ENCOUNTER — Ambulatory Visit (HOSPITAL_BASED_OUTPATIENT_CLINIC_OR_DEPARTMENT_OTHER)
Admission: RE | Admit: 2022-02-01 | Discharge: 2022-02-01 | Disposition: A | Discharge status: Home / Self Care | Payer: Medicaid Other | Attending: Podiatry | Admitting: Podiatry

## 2022-02-01 DIAGNOSIS — M2012 Hallux valgus (acquired), left foot: Secondary | ICD-10-CM | POA: Insufficient documentation

## 2022-02-01 DIAGNOSIS — F419 Anxiety disorder, unspecified: Secondary | ICD-10-CM | POA: Diagnosis not present

## 2022-02-01 DIAGNOSIS — Z87891 Personal history of nicotine dependence: Secondary | ICD-10-CM | POA: Insufficient documentation

## 2022-02-01 DIAGNOSIS — Z9884 Bariatric surgery status: Secondary | ICD-10-CM | POA: Diagnosis not present

## 2022-02-01 DIAGNOSIS — F32A Depression, unspecified: Secondary | ICD-10-CM | POA: Insufficient documentation

## 2022-02-01 DIAGNOSIS — M792 Neuralgia and neuritis, unspecified: Secondary | ICD-10-CM | POA: Diagnosis not present

## 2022-02-01 DIAGNOSIS — J45909 Unspecified asthma, uncomplicated: Secondary | ICD-10-CM | POA: Insufficient documentation

## 2022-02-01 DIAGNOSIS — G8918 Other acute postprocedural pain: Secondary | ICD-10-CM | POA: Diagnosis not present

## 2022-02-01 HISTORY — DX: Presence of spectacles and contact lenses: Z97.3

## 2022-02-01 HISTORY — DX: Personal history of other diseases of the circulatory system: Z86.79

## 2022-02-01 HISTORY — PX: HALLUX VALGUS LAPIDUS: SHX6626

## 2022-02-01 HISTORY — DX: Post-traumatic stress disorder, chronic: F43.12

## 2022-02-01 HISTORY — PX: HALLUX VALGUS AKIN: SHX6622

## 2022-02-01 HISTORY — DX: Unspecified asthma, uncomplicated: J45.909

## 2022-02-01 HISTORY — DX: Anxiety disorder, unspecified: F41.9

## 2022-02-01 HISTORY — DX: Other specified bacterial agents as the cause of diseases classified elsewhere: B96.89

## 2022-02-01 HISTORY — DX: Dorsalgia, unspecified: M54.9

## 2022-02-01 HISTORY — DX: Panic disorder (episodic paroxysmal anxiety): F41.0

## 2022-02-01 HISTORY — DX: Other specified bacterial agents as the cause of diseases classified elsewhere: N76.0

## 2022-02-01 LAB — POCT PREGNANCY, URINE: Preg Test, Ur: NEGATIVE

## 2022-02-01 SURGERY — BUNIONECTOMY, LAPIDUS
Anesthesia: General | Site: Foot | Laterality: Left

## 2022-02-01 MED ORDER — MIDAZOLAM HCL 2 MG/2ML IJ SOLN
INTRAMUSCULAR | Status: AC
  Filled 2022-02-01: qty 2

## 2022-02-01 MED ORDER — FENTANYL CITRATE (PF) 100 MCG/2ML IJ SOLN: 25.0000 ug | INTRAMUSCULAR | Status: DC | PRN

## 2022-02-01 MED ORDER — CLINDAMYCIN PHOSPHATE 600 MG/50ML IV SOLN
INTRAVENOUS | Status: AC
  Filled 2022-02-01: qty 50

## 2022-02-01 MED ORDER — MIDAZOLAM HCL 5 MG/5ML IJ SOLN
INTRAMUSCULAR | Status: DC | PRN
Start: 2022-02-01 — End: 2022-02-01

## 2022-02-01 MED ORDER — FENTANYL CITRATE (PF) 100 MCG/2ML IJ SOLN
INTRAMUSCULAR | Status: DC | PRN
  Administered 2022-02-01: 50 ug via INTRAVENOUS
  Administered 2022-02-01 (×2): 25 ug via INTRAVENOUS

## 2022-02-01 MED ORDER — MIDAZOLAM HCL 2 MG/2ML IJ SOLN
2.0000 mg | Freq: Once | INTRAMUSCULAR | Status: AC
  Administered 2022-02-01: 2 mg via INTRAVENOUS

## 2022-02-01 MED ORDER — LIDOCAINE 2% (20 MG/ML) 5 ML SYRINGE
INTRAMUSCULAR | Status: DC | PRN
  Administered 2022-02-01: 60 mg via INTRAVENOUS

## 2022-02-01 MED ORDER — 0.9 % SODIUM CHLORIDE (POUR BTL) OPTIME
TOPICAL | Status: DC | PRN
  Administered 2022-02-01: 500 mL

## 2022-02-01 MED ORDER — DEXAMETHASONE SODIUM PHOSPHATE 10 MG/ML IJ SOLN
INTRAMUSCULAR | Status: AC
  Filled 2022-02-01: qty 1

## 2022-02-01 MED ORDER — EPHEDRINE SULFATE-NACL 50-0.9 MG/10ML-% IV SOSY
PREFILLED_SYRINGE | INTRAVENOUS | Status: DC | PRN
  Administered 2022-02-01: 5 mg via INTRAVENOUS

## 2022-02-01 MED ORDER — ROPIVACAINE HCL 5 MG/ML IJ SOLN
INTRAMUSCULAR | Status: DC | PRN
  Administered 2022-02-01: 30 mL via PERINEURAL

## 2022-02-01 MED ORDER — PROPOFOL 10 MG/ML IV BOLUS
INTRAVENOUS | Status: DC | PRN
  Administered 2022-02-01: 200 mg via INTRAVENOUS

## 2022-02-01 MED ORDER — EPHEDRINE 5 MG/ML INJ
INTRAVENOUS | Status: AC
  Filled 2022-02-01: qty 5

## 2022-02-01 MED ORDER — DEXAMETHASONE SODIUM PHOSPHATE 10 MG/ML IJ SOLN
INTRAMUSCULAR | Status: DC | PRN
  Administered 2022-02-01: 10 mg via INTRAVENOUS

## 2022-02-01 MED ORDER — DEXMEDETOMIDINE (PRECEDEX) IN NS 20 MCG/5ML (4 MCG/ML) IV SYRINGE
PREFILLED_SYRINGE | INTRAVENOUS | Status: AC
  Filled 2022-02-01: qty 5

## 2022-02-01 MED ORDER — PROPOFOL 10 MG/ML IV BOLUS
INTRAVENOUS | Status: AC
  Filled 2022-02-01: qty 20

## 2022-02-01 MED ORDER — CLINDAMYCIN PHOSPHATE 600 MG/50ML IV SOLN
INTRAVENOUS | Status: DC | PRN
  Administered 2022-02-01: 600 mg via INTRAVENOUS

## 2022-02-01 MED ORDER — OXYCODONE HCL 5 MG/5ML PO SOLN: 5.0000 mg | Freq: Once | ORAL | Status: DC | PRN

## 2022-02-01 MED ORDER — FENTANYL CITRATE (PF) 100 MCG/2ML IJ SOLN
100.0000 ug | Freq: Once | INTRAMUSCULAR | Status: AC
  Administered 2022-02-01: 100 ug via INTRAVENOUS

## 2022-02-01 MED ORDER — DEXMEDETOMIDINE (PRECEDEX) IN NS 20 MCG/5ML (4 MCG/ML) IV SYRINGE
PREFILLED_SYRINGE | INTRAVENOUS | Status: DC | PRN
  Administered 2022-02-01: 12 ug via INTRAVENOUS

## 2022-02-01 MED ORDER — ONDANSETRON HCL 4 MG/2ML IJ SOLN: 4.0000 mg | Freq: Once | INTRAMUSCULAR | Status: DC | PRN

## 2022-02-01 MED ORDER — ONDANSETRON HCL 4 MG/2ML IJ SOLN
INTRAMUSCULAR | Status: DC | PRN
  Administered 2022-02-01: 4 mg via INTRAVENOUS

## 2022-02-01 MED ORDER — ONDANSETRON HCL 4 MG/2ML IJ SOLN
INTRAMUSCULAR | Status: AC
  Filled 2022-02-01: qty 2

## 2022-02-01 MED ORDER — FENTANYL CITRATE (PF) 100 MCG/2ML IJ SOLN
INTRAMUSCULAR | Status: AC
  Filled 2022-02-01: qty 2

## 2022-02-01 MED ORDER — LACTATED RINGERS IV SOLN: INTRAVENOUS | Status: DC

## 2022-02-01 MED ORDER — OXYCODONE HCL 5 MG PO TABS: 5.0000 mg | ORAL_TABLET | Freq: Once | ORAL | Status: DC | PRN

## 2022-02-01 SURGICAL SUPPLY — 86 items
APL PRP STRL LF DISP 70% ISPRP (MISCELLANEOUS) ×2
APL SKNCLS STERI-STRIP NONHPOA (GAUZE/BANDAGES/DRESSINGS)
BENZOIN TINCTURE PRP APPL 2/3 (GAUZE/BANDAGES/DRESSINGS) IMPLANT
BIT DRILL 2 FENESTRATED (MISCELLANEOUS) ×1 IMPLANT
BIT DRILL CANNULTD 2.6 X 130MM (DRILL) ×1 IMPLANT
BIT DRILL SOLID 2.0 X 110MM (DRILL) ×1 IMPLANT
BIT DRILLL 2 FENESTRATED (MISCELLANEOUS) ×1
BLADE AVERAGE 25X9 (BLADE) IMPLANT
BLADE OSC/SAG .038X5.5 CUT EDG (BLADE) ×2 IMPLANT
BLADE SAW 9X31X.051 (BLADE) ×2 IMPLANT
BLADE SURG 15 STRL LF DISP TIS (BLADE) ×6 IMPLANT
BLADE SURG 15 STRL SS (BLADE) ×12
BNDG CMPR 9X4 STRL LF SNTH (GAUZE/BANDAGES/DRESSINGS) ×2
BNDG COHESIVE 4X5 TAN ST LF (GAUZE/BANDAGES/DRESSINGS) IMPLANT
BNDG ELASTIC 4X5.8 VLCR STR LF (GAUZE/BANDAGES/DRESSINGS) ×4 IMPLANT
BNDG ELASTIC 6X5.8 VLCR STR LF (GAUZE/BANDAGES/DRESSINGS) IMPLANT
BNDG ESMARK 4X9 LF (GAUZE/BANDAGES/DRESSINGS) ×3 IMPLANT
CHLORAPREP W/TINT 26 (MISCELLANEOUS) ×3 IMPLANT
COUNTERSICK 4.0 HEADED (MISCELLANEOUS) ×3
COVER BACK TABLE 60X90IN (DRAPES) ×3 IMPLANT
COVER MAYO STAND STRL (DRAPES) ×2 IMPLANT
CUFF TOURN SGL QUICK 34 (TOURNIQUET CUFF)
CUFF TRNQT CYL 34X4.125X (TOURNIQUET CUFF) IMPLANT
DRAPE EXTREMITY T 121X128X90 (DISPOSABLE) ×3 IMPLANT
DRAPE IMP U-DRAPE 54X76 (DRAPES) ×3 IMPLANT
DRAPE OEC MINIVIEW 54X84 (DRAPES) ×2 IMPLANT
DRAPE U-SHAPE 47X51 STRL (DRAPES) ×3 IMPLANT
DRILL CANNULATED 2.6 X 130MM (DRILL) ×3
DRILL SOLID 2.0 X 110MM (DRILL) ×3
DRSG ADAPTIC 3X8 NADH LF (GAUZE/BANDAGES/DRESSINGS) ×2 IMPLANT
DRSG EMULSION OIL 3X3 NADH (GAUZE/BANDAGES/DRESSINGS) ×3 IMPLANT
ELECT REM PT RETURN 9FT ADLT (ELECTROSURGICAL) ×3
ELECTRODE REM PT RTRN 9FT ADLT (ELECTROSURGICAL) ×2 IMPLANT
GAUZE 4X4 16PLY ~~LOC~~+RFID DBL (SPONGE) ×3 IMPLANT
GAUZE SPONGE 4X4 12PLY STRL (GAUZE/BANDAGES/DRESSINGS) ×3 IMPLANT
GAUZE SPONGE 4X4 12PLY STRL LF (GAUZE/BANDAGES/DRESSINGS) ×2 IMPLANT
GLOVE SRG 8 PF TXTR STRL LF DI (GLOVE) ×2 IMPLANT
GLOVE SURG ENC TEXT LTX SZ7.5 (GLOVE) ×3 IMPLANT
GLOVE SURG UNDER POLY LF SZ8 (GLOVE) ×3
GOWN STRL REUS W/ TWL LRG LVL3 (GOWN DISPOSABLE) ×2 IMPLANT
GOWN STRL REUS W/TWL LRG LVL3 (GOWN DISPOSABLE) ×3
K-WIRE SMOOTH TROCAR 2.0X150 (WIRE) ×9
K-WIRE SNGL END 1.2X150 (MISCELLANEOUS) ×15
KWIRE SMOOTH TROCAR 2.0X150 (WIRE) ×3 IMPLANT
KWIRE SNGL END 1.2X150 (MISCELLANEOUS) ×5 IMPLANT
NDL SAFETY ECLIPSE 18X1.5 (NEEDLE) ×2 IMPLANT
NEEDLE HYPO 18GX1.5 SHARP (NEEDLE) ×3
NS IRRIG 1000ML POUR BTL (IV SOLUTION) ×1 IMPLANT
NS IRRIG 500ML POUR BTL (IV SOLUTION) ×2 IMPLANT
PACK BASIN DAY SURGERY FS (CUSTOM PROCEDURE TRAY) ×3 IMPLANT
PAD CAST 4YDX4 CTTN HI CHSV (CAST SUPPLIES) ×2 IMPLANT
PADDING CAST COTTON 4X4 STRL (CAST SUPPLIES) ×3
PENCIL SMOKE EVACUATOR (MISCELLANEOUS) ×3 IMPLANT
PLATE COMPRESS LAPIDUS 3H ST (Plate) ×2 IMPLANT
PUTTY DBM STAGRAFT PLUS 5CC (Putty) ×2 IMPLANT
Paragon Oscillating Saw Blade ×2 IMPLANT
RASP SM TEAR CROSS CUT (RASP) ×2 IMPLANT
SCREW 2.7 X 15 NON LOCK (Screw) ×2 IMPLANT
SCREW CANN 4.0X38 SHORT THREAD (Screw) ×2 IMPLANT
SCREW CANN 4.0X50 SHORT (Screw) ×2 IMPLANT
SCREW COUNTERSINK 4.0 HEADED (MISCELLANEOUS) ×1 IMPLANT
SCREW LOCK PLATE R3 2.7X14 (Screw) ×6 IMPLANT
SHEET MEDIUM DRAPE 40X70 STRL (DRAPES) ×3 IMPLANT
SLEEVE SCD COMPRESS KNEE MED (STOCKING) ×3 IMPLANT
SPIKE FLUID TRANSFER (MISCELLANEOUS) IMPLANT
SPLINT FAST PLASTER 5X30 (CAST SUPPLIES) ×1
SPLINT FIBERGLASS 4X30 (CAST SUPPLIES) ×3 IMPLANT
SPLINT PLASTER CAST FAST 5X30 (CAST SUPPLIES) ×1 IMPLANT
STAPLE SYS JAWS 8X8X8 (Staple) ×2 IMPLANT
STOCKINETTE 6  STRL (DRAPES) ×1
STOCKINETTE 6 STRL (DRAPES) ×2 IMPLANT
STRIP CLOSURE SKIN 1/2X4 (GAUZE/BANDAGES/DRESSINGS) IMPLANT
SUCTION FRAZIER HANDLE 10FR (MISCELLANEOUS) ×1
SUCTION TUBE FRAZIER 10FR DISP (MISCELLANEOUS) ×2 IMPLANT
SUT ETHILON 3 0 PS 1 (SUTURE) IMPLANT
SUT ETHILON 4 0 PS 2 18 (SUTURE) ×6 IMPLANT
SUT FIBERWIRE 2-0 18 17.9 3/8 (SUTURE)
SUT MNCRL AB 3-0 PS2 18 (SUTURE) IMPLANT
SUT PDS AB 2-0 CT2 27 (SUTURE) ×3 IMPLANT
SUT VIC AB 3-0 FS2 27 (SUTURE) ×2 IMPLANT
SUTURE FIBERWR 2-0 18 17.9 3/8 (SUTURE) IMPLANT
SYR 10ML LL (SYRINGE) ×3 IMPLANT
SYR BULB EAR ULCER 3OZ GRN STR (SYRINGE) ×3 IMPLANT
TOWEL OR 17X26 10 PK STRL BLUE (TOWEL DISPOSABLE) ×3 IMPLANT
TUBE CONNECTING 12X1/4 (SUCTIONS) ×3 IMPLANT
WIRE OLIVE SMOOTH 1.4MMX60MM (WIRE) ×6 IMPLANT

## 2022-02-01 NOTE — H&P (Signed)
History and Physical Interval Note: ? ?02/01/2022 ?1:26 PM ? ?Tara Blake  has presented today for surgery, with the diagnosis of bunion of left foot..  The various methods of treatment have been discussed with the patient and family. After consideration of risks, benefits and other options for treatment, the patient has consented to   ?Procedure(s) with comments: ?HALLUX VALGUS LAPIDUS (Left) - POP BLOCK ?BUNIONECTOMY (Left) ?AKIN BUNIONECTOMY (Left) as a surgical intervention.  The patient's history has been reviewed, patient examined, no change in status, stable for surgery.  I have reviewed the patient's chart and labs.  Questions were answered to the patient's satisfaction.   ? ? ?Kieth Brightly ? ? ?

## 2022-02-01 NOTE — Anesthesia Procedure Notes (Signed)
Procedure Name: LMA Insertion ?Date/Time: 02/01/2022 1:54 PM ?Performed by: Bishop Limbo, CRNA ?Pre-anesthesia Checklist: Patient identified, Emergency Drugs available, Suction available and Patient being monitored ?Patient Re-evaluated:Patient Re-evaluated prior to induction ?Oxygen Delivery Method: Circle System Utilized ?Preoxygenation: Pre-oxygenation with 100% oxygen ?Induction Type: IV induction ?Ventilation: Mask ventilation without difficulty ?LMA: LMA inserted ?LMA Size: 4.0 ?Number of attempts: 1 ?Placement Confirmation: positive ETCO2 ?Tube secured with: Tape ?Dental Injury: Teeth and Oropharynx as per pre-operative assessment  ? ? ? ? ?

## 2022-02-01 NOTE — H&P (Signed)
Anesthesia H&P Update: History and Physical Exam reviewed; patient is OK for planned anesthetic and procedure. ? ?

## 2022-02-01 NOTE — Transfer of Care (Signed)
Immediate Anesthesia Transfer of Care Note ? ?Patient: Tara Blake ? ?Procedure(s) Performed: HALLUX VALGUS LAPIDUS (Left: Foot) ?AKIN BUNIONECTOMY (Left: Foot) ? ?Patient Location: PACU ? ?Anesthesia Type:General and Regional ? ?Level of Consciousness: awake, alert , oriented and patient cooperative ? ?Airway & Oxygen Therapy: Patient Spontanous Breathing ? ?Post-op Assessment: Report given to RN and Post -op Vital signs reviewed and stable ? ?Post vital signs: Reviewed and stable ? ?Last Vitals:  ?Vitals Value Taken Time  ?BP 117/81 02/01/22 1645  ?Temp    ?Pulse 94 02/01/22 1648  ?Resp 17 02/01/22 1648  ?SpO2 100 % 02/01/22 1648  ?Vitals shown include unvalidated device data. ? ?Last Pain:  ?Vitals:  ? 02/01/22 1240  ?TempSrc: Oral  ?   ? ?  ? ?Complications: No notable events documented. ?

## 2022-02-01 NOTE — Discharge Instructions (Addendum)
Post-Operative Discharge Instructions  ?Patient name: Tara Blake ?MRN: [id number] ?DOB: [birth year] ? ?Date of Surgery: 02/01/2022 ?           ?Location: [facility] ? ?Follow up appointment:  Next week ? ?On the Day of Surgery: Please pick up prescription(s) prior to the morning of surgery.  ?Please go directly to the couch or bed and keep your feet elevated by putting two pillows under your feet and one pillow under your knees. Keep your feet out from under the blanket.  ? ?Discomfort and Swelling: Your foot may be numb for the remainder of the day. Swelling is expected. In some cases, the skin of the foot or the leg may take on a bruised, black and blue appearance.  ? ?Temperature: Take your temperature on the 2nd, 3rd, and 4th day after your surgery at 5:00pm. If your temperature is above 101 degrees, please call the physician's office.  ? ?Bleeding: A slight amount of drainage on the dressing is normal. Resting your foot in an elevated position will limit bleeding. If bleeding continues, wrap a towel around your foot and apply an ice pack.  ? ?Dressing: Keep the bandage absolutely dry and DO NOT remove the dressing.  ?If the dressing becomes wet or soiled, call your physician?s office immediately.  ? ?Stitches: The stitches will remain in place for about two weeks, depending on the nature of your operation. Slight pulling sensation may be felt due to the stitches. This is a normal occurrence.  ? ?Ice: Apply a well-sealed ice pack to your foot or behind your knee for 20 minutes out of every hour for the first 24 hours after your surgery. DO NOT leave the ice pack in place at bedtime or during long naps. Do not place an ice pack directly on skin, place a washcloth between the ice pack and the dressing. This will help avoid getting the dressing wet, and help avoid any ice burn. ? ?Weight Bearing Status:  ?Non-weight bearing  ? ?  ? ?Medications: ?Take aspirin 81 mg twice daily for 6 weeks ?Take  clindamycin 300 mg two times daily for 7 days ?Take oxycodone 5 mg four times daily for 5 days.  ? ?  ?Shoes: Wear your post-operative shoe or boot anytime you put weight on your feet, unless noted above. DO NOT DRIVE WITH BOOT ON RIGHT FOOT. ? ?Diet: Return to your regular diet slowly within 24 hours. If you received sedation, your first meal should be light. Do not eat greasy or spicy foods for the first meal. Drink large quantities of liquids, especially water, citrus and other fruit juices. Please call if you?re unable to keep fluids down. Eat food with your medication. ? ?General Activities: Recovery is a gradual process; however you should feel better with each passing day. The first day, leave the bed only to go to the bathroom. Gradually increase your activity after the first day. For the first week or two, resting each day is important. Strenuous work, heavy lifting and excessive social activities should be avoided. Rest. Ice. Elevate. ? ?Postoperative Care: The post-operative care periods lasts for approximately 6-12 weeks. During this time, periodic visits to your physician?s office will be required so your healing process can be monitored closely. It is essential for your recovery that you continue to be monitored by your physician until you are discharged and completely healed. Care during this time is the most important part of your recovery process.  ? ?Recovery from anesthesia:  You may feel drowsy and reflexes may be slowed for 24 hours. Do not drive, use machinery, appliances, ride bicycles, or scooter. Do not make important decisions.  ? ?Complications: Please call your physician following surgery if you have any of the following complications:  ?        1.)  Severe pain unrelieved by medication  ? 2.) Excessive heavy or prolonged bleeding  ? 3.) Dizziness or fainting  ? 4.) Soiled or wet dressings  ? 5.) Temperature over 101.0 degrees   ? ? ?Regional Anesthesia Blocks ? ?1. Numbness or the  inability to move the "blocked" extremity may last from 3-48 hours after placement. The length of time depends on the medication injected and your individual response to the medication. If the numbness is not going away after 48 hours, call your surgeon. ? ?2. The extremity that is blocked will need to be protected until the numbness is gone and the  Strength has returned. Because you cannot feel it, you will need to take extra care to avoid injury. Because it may be weak, you may have difficulty moving it or using it. You may not know what position it is in without looking at it while the block is in effect. ? ?3. For blocks in the legs and feet, returning to weight bearing and walking needs to be done carefully. You will need to wait until the numbness is entirely gone and the strength has returned. You should be able to move your leg and foot normally before you try and bear weight or walk. You will need someone to be with you when you first try to ensure you do not fall and possibly risk injury. ? ?4. Bruising and tenderness at the needle site are common side effects and will resolve in a few days. ? ?5. Persistent numbness or new problems with movement should be communicated to the surgeon or the [facility] [phone] [facility] [phone]).  ? ? ? ? ? ? ?Post Anesthesia Home Care Instructions ? ?Activity: ?Get plenty of rest for the remainder of the day. A responsible individual must stay with you for 24 hours following the procedure.  ?For the next 24 hours, DO NOT: ?-Drive a car ?-Advertising copywriter ?-Drink alcoholic beverages ?-Take any medication unless instructed by your physician ?-Make any legal decisions or sign important papers. ? ?Meals: ?Start with liquid foods such as gelatin or soup. Progress to regular foods as tolerated. Avoid greasy, spicy, heavy foods. If nausea and/or vomiting occur, drink only clear liquids until the nausea and/or vomiting subsides. Call  your physician if vomiting continues. ? ?Special Instructions/Symptoms: ?Your throat may feel dry or sore from the anesthesia or the breathing tube placed in your throat during surgery. If this causes discomfort, gargle with warm salt water. The discomfort should disappear within 24 hours. ?    ?

## 2022-02-01 NOTE — Anesthesia Procedure Notes (Signed)
Anesthesia Regional Block: Popliteal block  ? ?Pre-Anesthetic Checklist: , timeout performed,  Correct Patient, Correct Site, Correct Laterality,  Correct Procedure, Correct Position, site marked,  Risks and benefits discussed,  Surgical consent,  Pre-op evaluation,  At surgeon's request and post-op pain management ? ?Laterality: Left ? ?Prep: chloraprep     ?  ?Needles:  ?Injection technique: Single-shot ? ?Needle Type: Echogenic Stimulator Needle   ? ? ?Needle Length: 10cm  ?Needle Gauge: 21  ? ?Needle insertion depth: 6 cm ? ? ?Additional Needles: ? ? ?Procedures:,,,, ultrasound used (permanent image in chart),,    ?Narrative:  ?Start time: 02/01/2022 1:34 PM ?End time: 02/01/2022 1:39 PM ?Injection made incrementally with aspirations every 5 mL. ? ?Performed by: Personally  ?Anesthesiologist: Mal Amabile, MD ? ?Additional Notes: ?Timeout performed. Patient sedated. Relevant anatomy ID'd using [place]. Incremental 2-84ml injection of LA with frequent aspiration. Patient tolerated procedure well. ? ? ? ? ?Left Popliteal Block ?

## 2022-02-01 NOTE — Anesthesia Preprocedure Evaluation (Addendum)
Anesthesia Evaluation  ?Patient identified by MRN, date of birth, ID band ?Patient awake ? ? ? ?Reviewed: ?Allergy & Precautions, NPO status , Patient's Chart, lab work & pertinent test results ? ?Airway ?Mallampati: II ? ?TM Distance: >3 FB ?Neck ROM: Full ? ? ? Dental ? ?(+) Teeth Intact, Dental Advisory Given ?  ?Pulmonary ?asthma , former smoker,  ?  ?Pulmonary exam normal ?breath sounds clear to auscultation ? ? ? ? ? ? Cardiovascular ?negative cardio ROS ?Normal cardiovascular exam ?Rhythm:Regular Rate:Normal ? ? ?  ?Neuro/Psych ? Headaches, PSYCHIATRIC DISORDERS Anxiety Depression Hx/o PTSD  ? GI/Hepatic ?Neg liver ROS, S/P sleeve gastrectomy ?  ?Endo/Other  ?negative endocrine ROS ? Renal/GU ?negative Renal ROS  ?negative genitourinary ?  ?Musculoskeletal ?Bunion left foot  ? Abdominal ?  ?Peds ? Hematology ? ?(+) Blood dyscrasia, anemia ,   ?Anesthesia Other Findings ? ? Reproductive/Obstetrics ? ?  ? ? ? ? ? ? ? ? ? ? ? ? ? ?  ?  ? ? ? ? ? ? ? ?Anesthesia Physical ?Anesthesia Plan ? ?ASA: 2 ? ?Anesthesia Plan: General  ? ?Post-op Pain Management: Regional block*  ? ?Induction: Intravenous ? ?PONV Risk Score and Plan: 4 or greater and Treatment may vary due to age or medical condition, Midazolam, Ondansetron and Dexamethasone ? ?Airway Management Planned: LMA ? ?Additional Equipment: None ? ?Intra-op Plan:  ? ?Post-operative Plan: Extubation in OR ? ?Informed Consent: I have reviewed the patients History and Physical, chart, labs and discussed the procedure including the risks, benefits and alternatives for the proposed anesthesia with the patient or authorized representative who has indicated his/her understanding and acceptance.  ? ? ? ?Dental advisory given ? ?Plan Discussed with: CRNA and Anesthesiologist ? ?Anesthesia Plan Comments:   ? ? ? ? ? ? ?Anesthesia Quick Evaluation ? ?

## 2022-02-01 NOTE — Progress Notes (Signed)
Assisted Dr. Foster with left, popliteal, ultrasound guided block. Side rails up, monitors on throughout procedure. See vital signs in flow sheet. Tolerated Procedure well. 

## 2022-02-01 NOTE — Op Note (Addendum)
?OPERATIVE REPORT ?Patient name: Tara Blake ?MRN: [id number] ?DOB: 02/26/1996 ? ?DOS:  02/01/2022 ? ?Preop Dx: Hallux valgus of left foot. ?Postop Dx: same ? ?Procedure:  ?1. Lapidus bunionectomy left ?2. Akin phalangeal osteotomy ? ?Surgeon: Hinton Rao, DPM ? ?Anesthesia: Popliteal block ? ?Hemostasis: Calf tourniquet inflated to a pressure of after esmarch exsanguination  ? ?EBL: 5 mL ?Materials: 3, 14 mm locking screws, 1, 16 mm  nonlocking screw, 1, 38 mm lag screw (paragon), 1 paragon midfoot plate, 1 8 mm staple. 5 cc Demineralized bone matrix ?Injectables: none ?Pathology: none ? ?Condition: The patient tolerated the procedure and anesthesia well. No complications noted or reported ? ? ?Justification for procedure: The patient is a 26 y.o. female who presents today for surgical correction of left foot bunion deformity. All conservative modalities of been unsuccessful in providing any sort of satisfactory alleviation of symptoms with the patient. The patient was identified preoperatively and the correct operative extremity was marked by myself. Patient was educated pre-operatively on risks, complications, and alternatives. The patient consented for surgical correction. The patient consent form was reviewed. All patient questions were answered. No guarantees were expressed or implied. The patient and the surgeon both signed the patient consent form with the witness present and placed in the patient's chart. ? ? ?Procedure in Detail: The patient was brought to the operating room, placed on the operating table in a supine position and anesthesia was induced. The left lower extremity was prepped and draped in a standard sterile fashion and a timeout was performed to verify the correct operative site. Attention was then directed to the surgical area where procedure number one commenced. ? ?Procedure #1: lapidus bunionectomy left foot. ?Lapidus Arthrodesis of First Metatarsal Cuneiform Joint CPT  [phone]: ? ?Attention was directed over the dorsal medial aspect of the first tarsometatarsal joint where a linear, longitudinal incision was made, medial to the extensor hallucis longus tendon, starting proximal to the metatarsal-cuneiform joint and extending distally to about the midshaft of the first metatarsal. Dissection was carried down to the first metatarsal-cuneiform joint. A linear capsulotomy was performed over the first metatarsal-cuneiform joint, exposing both sides of the joint. Two steinmen pins were utilized to distract the first metatarsal-cuneiform joint with the aid of a hinterman retractor. A saggital saw was used to resect both cartilaginous sides of the joint. The wound was then flushed with normal sterile saline. Once the joint was prepared with subchondral drilling and osteotome fish-scaling, and demineralized bone matrix was applied to the arthrodesis site, he hinterman retractor and Steinman pins were removed. A steinmen  pin was then introduced into the midshaft of the first metatarsal and was utilized to rotate the metatarsal out of valgus. A weber clamp was then utilized to obtain reduction of the first and second intermetatarsal angle. Initial compression of the joint was obtained manually with dorsiflexion of the great toe. Once the reduction maneuver was adequately made, A steinmen pin was utilized to temporarily hold the reduction. This reduction was checked under C-arm. A paragon plate of appropriate size was then tacked down using olive wires. Alignment was checked using C-arm. Once I was happy with plate placement, the steinman pin holding the reduction was overdrilled and a lag screw was utilized to compress the joint. The distal locking screws were drilled and filled with appropriately sized screws into the metatarsal and cuneiform.  Attention was directed to the Talonavicular joint to assure this screw did not violate the joint. A 4.0-mm partially threaded screw was  placed across  the fusion site from dorsal to plantar. Good compression was noted across the joint. Olive wires and Steinman pins were then removed from the plate and the plate was then secured proximally in the medial cuneiform with locking screws. A C-arm was used to verify proper positioning of all the screws and plate. Good correction was noted following the fusion. Sesamoids appear to be under the first metatarsal head in proper anatomic position. The medial eminence was then contoured with a rasp to further remove the bunion deformity. ? ?The intercuneiform joints were stressed and determined to be stable, no intraoperative stabilization of the intercuneiform joint noted. ?  ?C-arm used to verify proper positioning of the screws and plate. ? ? ? ?Procedure #2: akin osteotomy left hallux CPT 28310 ? ?A medial incision at the level of the hallux proximal phalanx midshaft was created.  The incision was carefully deepened down through the soft tissues to the level of bone. Then the saw blade was introduced through this incision and an oblique osteotomy was made through the shaft of the proximal phalanx making sure to preserve a lateral hinge of bone on the proximal phalanx. Then the osteotomy was closed down to achieve the required correction. A staple was placed perpendicular to the osteotomy site. The position of the hardware as well as the toe was then verified utilizing intraoperative fluoroscopy. At the completion of the procedure the toe position was adequately reduced. ? ? ? ?Dry sterile compressive dressings were then applied to all previously mentioned incision sites about the patient's lower extremity. The tourniquet which was used for hemostasis was deflated. All normal neurovascular responses including pink color and warmth returned all the digits of patient's lower extremity. ? ?The patient was then transferred from the operating room to the recovery room having tolerated the procedure and anesthesia well. All vital  signs are stable. After a brief stay in the recovery room the patient was discharged with adequate prescriptions for analgesia. Verbal as well as written instructions were provided for the patient regarding wound care. The patient is to keep the dressings clean dry and intact until they are to follow surgeon Dr. Hinton Rao, DPM in the office upon discharge.  ? ?Hinton Rao DPM ? ?

## 2022-02-05 ENCOUNTER — Encounter (HOSPITAL_BASED_OUTPATIENT_CLINIC_OR_DEPARTMENT_OTHER): Payer: Self-pay | Admitting: Podiatry

## 2022-02-07 DIAGNOSIS — M792 Neuralgia and neuritis, unspecified: Secondary | ICD-10-CM | POA: Diagnosis not present

## 2022-02-07 DIAGNOSIS — M2012 Hallux valgus (acquired), left foot: Secondary | ICD-10-CM | POA: Diagnosis not present

## 2022-02-07 NOTE — Anesthesia Postprocedure Evaluation (Signed)
Anesthesia Post Note ? ?Patient: Tara Blake ? ?Procedure(s) Performed: HALLUX VALGUS LAPIDUS (Left: Foot) ?AKIN BUNIONECTOMY (Left: Foot) ? ?  ? ?Patient location during evaluation: PACU ?Anesthesia Type: General ?Level of consciousness: awake and alert ?Pain management: pain level controlled ?Vital Signs Assessment: post-procedure vital signs reviewed and stable ?Respiratory status: spontaneous breathing, nonlabored ventilation, respiratory function stable and patient connected to nasal cannula oxygen ?Cardiovascular status: blood pressure returned to baseline and stable ?Postop Assessment: no apparent nausea or vomiting ?Anesthetic complications: no ? ? ?No notable events documented. ? ?Last Vitals:  ?Vitals:  ? 02/01/22 1715 02/01/22 1755  ?BP: 118/85 119/82  ?Pulse: 83 76  ?Resp: 16 16  ?Temp: 36.6 ?C 36.6 ?C  ?SpO2: 99% 99%  ?  ?Last Pain:  ?Vitals:  ? 02/05/22 1000  ?TempSrc:   ?PainSc: 4   ? ? ?  ?  ?  ?  ?  ?  ? ?Sereena Marando S ? ? ? ? ?

## 2022-02-08 DIAGNOSIS — M792 Neuralgia and neuritis, unspecified: Secondary | ICD-10-CM | POA: Diagnosis not present

## 2022-02-08 DIAGNOSIS — M2012 Hallux valgus (acquired), left foot: Secondary | ICD-10-CM | POA: Diagnosis not present

## 2022-02-12 DIAGNOSIS — F411 Generalized anxiety disorder: Secondary | ICD-10-CM | POA: Diagnosis not present

## 2022-02-12 DIAGNOSIS — F331 Major depressive disorder, recurrent, moderate: Secondary | ICD-10-CM | POA: Diagnosis not present

## 2022-02-14 DIAGNOSIS — M792 Neuralgia and neuritis, unspecified: Secondary | ICD-10-CM | POA: Diagnosis not present

## 2022-02-14 DIAGNOSIS — M2012 Hallux valgus (acquired), left foot: Secondary | ICD-10-CM | POA: Diagnosis not present

## 2022-02-28 DIAGNOSIS — M2012 Hallux valgus (acquired), left foot: Secondary | ICD-10-CM | POA: Diagnosis not present

## 2022-02-28 DIAGNOSIS — M792 Neuralgia and neuritis, unspecified: Secondary | ICD-10-CM | POA: Diagnosis not present

## 2022-03-14 DIAGNOSIS — M2012 Hallux valgus (acquired), left foot: Secondary | ICD-10-CM | POA: Diagnosis not present

## 2022-03-14 DIAGNOSIS — M792 Neuralgia and neuritis, unspecified: Secondary | ICD-10-CM | POA: Diagnosis not present

## 2022-04-11 DIAGNOSIS — M792 Neuralgia and neuritis, unspecified: Secondary | ICD-10-CM | POA: Diagnosis not present

## 2022-04-11 DIAGNOSIS — M2012 Hallux valgus (acquired), left foot: Secondary | ICD-10-CM | POA: Diagnosis not present

## 2022-05-18 ENCOUNTER — Encounter: Payer: Self-pay | Admitting: Obstetrics and Gynecology

## 2022-05-18 ENCOUNTER — Ambulatory Visit (INDEPENDENT_AMBULATORY_CARE_PROVIDER_SITE_OTHER): Payer: Medicaid Other | Admitting: Obstetrics and Gynecology

## 2022-05-18 VITALS — BP 146/100 | HR 94 | Ht 64.0 in | Wt 157.0 lb

## 2022-05-18 DIAGNOSIS — R350 Frequency of micturition: Secondary | ICD-10-CM | POA: Diagnosis not present

## 2022-05-18 DIAGNOSIS — N393 Stress incontinence (female) (male): Secondary | ICD-10-CM | POA: Diagnosis not present

## 2022-05-18 DIAGNOSIS — R310 Gross hematuria: Secondary | ICD-10-CM

## 2022-05-18 DIAGNOSIS — M62838 Other muscle spasm: Secondary | ICD-10-CM

## 2022-05-18 LAB — POCT URINALYSIS DIPSTICK
Bilirubin, UA: NEGATIVE
Blood, UA: NEGATIVE
Glucose, UA: NEGATIVE
Ketones, UA: NEGATIVE
Nitrite, UA: NEGATIVE
Protein, UA: NEGATIVE
Spec Grav, UA: 1.025 (ref 1.010–1.025)
Urobilinogen, UA: 1 E.U./dL
pH, UA: 7 (ref 5.0–8.0)
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
## 2022-05-21 ENCOUNTER — Encounter: Payer: Self-pay | Admitting: Obstetrics and Gynecology

## 2022-05-31 ENCOUNTER — Ambulatory Visit: Payer: Medicaid Other | Admitting: Physical Therapy

## 2022-05-31 ENCOUNTER — Other Ambulatory Visit: Payer: Medicaid Other | Admitting: Obstetrics and Gynecology

## 2022-05-31 NOTE — Progress Notes (Deleted)
CYSTOSCOPY  CC:  This is a 26 y.o. with gross hematuria who presents today for cystoscopy.  @ENCLABS @  LMP 05/11/2022   CYSTOSCOPY: A time out was performed.  The periurethral area was prepped and draped in a sterile manner.  2% lidocaine jetpack was inserted at the urethral meatus and the urethra and bladder visualized with 12- and 70-degree scopes.  She had {Desc; normal/diminished:12763} urethral coaptation and {Desc; normal/abnormal w/wildcard:19060} urethral mucosa.  She had {Desc; normal/abnormal w/wildcard:19060} bladder mucosa. She had bilateral clear efflux from both ureteral orifices.  She had no squamous metaplasia at the trigone, no trabeculations, cellules or diverticuli.     ASSESSMENT:  26 y.o. with {cysto problems:24783}. Cystoscopy today is {Desc; normal/abnormal w/wildcard:19060}.  PLAN:  Has appt for CT A/P to assess kidneys tomorrow.  Follow-up to discuss findings and treatment options.  All questions answered and post-procedures instructions were given  22, MD

## 2022-06-01 ENCOUNTER — Ambulatory Visit (HOSPITAL_BASED_OUTPATIENT_CLINIC_OR_DEPARTMENT_OTHER): Admission: RE | Admit: 2022-06-01 | Payer: Medicaid Other | Source: Ambulatory Visit

## 2022-06-01 DIAGNOSIS — M2012 Hallux valgus (acquired), left foot: Secondary | ICD-10-CM | POA: Diagnosis not present

## 2022-06-01 DIAGNOSIS — M792 Neuralgia and neuritis, unspecified: Secondary | ICD-10-CM | POA: Diagnosis not present

## 2022-06-08 ENCOUNTER — Ambulatory Visit (HOSPITAL_BASED_OUTPATIENT_CLINIC_OR_DEPARTMENT_OTHER)
Admission: RE | Admit: 2022-06-08 | Discharge: 2022-06-08 | Disposition: A | Discharge status: Home / Self Care | Payer: Medicaid Other | Source: Ambulatory Visit | Attending: Obstetrics and Gynecology | Admitting: Obstetrics and Gynecology

## 2022-06-08 ENCOUNTER — Encounter (HOSPITAL_BASED_OUTPATIENT_CLINIC_OR_DEPARTMENT_OTHER): Payer: Self-pay

## 2022-06-08 DIAGNOSIS — R310 Gross hematuria: Secondary | ICD-10-CM

## 2022-06-08 DIAGNOSIS — R160 Hepatomegaly, not elsewhere classified: Secondary | ICD-10-CM | POA: Diagnosis not present

## 2022-06-08 MED ORDER — IOHEXOL 300 MG/ML  SOLN
150.0000 mL | Freq: Once | INTRAMUSCULAR | Status: AC | PRN
  Administered 2022-06-08: 150 mL via INTRAVENOUS

## 2022-06-28 ENCOUNTER — Ambulatory Visit: Payer: Medicaid Other | Admitting: Physical Therapy

## 2022-07-17 DIAGNOSIS — F331 Major depressive disorder, recurrent, moderate: Secondary | ICD-10-CM | POA: Diagnosis not present

## 2022-07-17 DIAGNOSIS — F411 Generalized anxiety disorder: Secondary | ICD-10-CM | POA: Diagnosis not present

## 2022-07-17 DIAGNOSIS — F4312 Post-traumatic stress disorder, chronic: Secondary | ICD-10-CM | POA: Diagnosis not present

## 2022-07-19 ENCOUNTER — Encounter: Payer: Self-pay | Admitting: Obstetrics and Gynecology

## 2022-07-19 ENCOUNTER — Ambulatory Visit: Payer: Medicaid Other | Admitting: Obstetrics and Gynecology

## 2022-07-19 VITALS — BP 138/90 | HR 80 | Wt 160.0 lb

## 2022-07-19 DIAGNOSIS — M62838 Other muscle spasm: Secondary | ICD-10-CM

## 2022-07-19 DIAGNOSIS — F331 Major depressive disorder, recurrent, moderate: Secondary | ICD-10-CM | POA: Diagnosis not present

## 2022-07-19 DIAGNOSIS — F4312 Post-traumatic stress disorder, chronic: Secondary | ICD-10-CM | POA: Diagnosis not present

## 2022-07-19 DIAGNOSIS — R350 Frequency of micturition: Secondary | ICD-10-CM | POA: Diagnosis not present

## 2022-07-19 DIAGNOSIS — F411 Generalized anxiety disorder: Secondary | ICD-10-CM | POA: Diagnosis not present

## 2022-07-19 DIAGNOSIS — N393 Stress incontinence (female) (male): Secondary | ICD-10-CM

## 2022-07-19 DIAGNOSIS — R310 Gross hematuria: Secondary | ICD-10-CM | POA: Diagnosis not present

## 2022-07-19 LAB — POCT URINALYSIS DIPSTICK
Bilirubin, UA: NEGATIVE
Blood, UA: NEGATIVE
Glucose, UA: NEGATIVE
Ketones, UA: NEGATIVE
Leukocytes, UA: NEGATIVE
Nitrite, UA: NEGATIVE
Protein, UA: NEGATIVE
Spec Grav, UA: 1.015 (ref 1.010–1.025)
Urobilinogen, UA: 0.2 E.U./dL
pH, UA: 7 (ref 5.0–8.0)

## 2022-07-19 NOTE — Progress Notes (Signed)
CYSTOSCOPY  CC:  This is a 26 y.o. with gross hematuria who presents today for cystoscopy.  She also had CT A/P w/ contrast:  1. No CT findings to explain hematuria. No evidence of urinary tract calculus, mass, or hydronephrosis. No urinary tract filling defect on delayed phase imaging. 2. Status post partial sleeve gastrectomy. 3. Hepatomegaly.  She had previously been referred to pelvic PT for her incontinence but was not able to make appointments over the summer. She is interested in referral now.   POC urine: negative  BP (!) 138/90   Pulse 80   Wt 160 lb (72.6 kg)   LMP 07/12/2022   BMI 27.46 kg/m   CYSTOSCOPY: A time out was performed.  The periurethral area was prepped and draped in a sterile manner.  2% lidocaine jetpack was inserted at the urethral meatus and the urethra and bladder visualized with a 70-degree scope.  She had normal urethral coaptation and normal urethral mucosa.  She had normal bladder mucosa. She had bilateral clear efflux from both ureteral orifices.  Had mild squamous metaplasia at the trigone. There were no trabeculations, cellules or diverticuli.     ASSESSMENT:  26 y.o. with gross hematuria. Cystoscopy today is normal.  PLAN:   - workup negative for hematuria - Referral placed to pelvic PT  Follow up as needed  Jaquita Folds, MD

## 2022-07-26 DIAGNOSIS — F411 Generalized anxiety disorder: Secondary | ICD-10-CM | POA: Diagnosis not present

## 2022-07-26 DIAGNOSIS — F4312 Post-traumatic stress disorder, chronic: Secondary | ICD-10-CM | POA: Diagnosis not present

## 2022-07-26 DIAGNOSIS — F331 Major depressive disorder, recurrent, moderate: Secondary | ICD-10-CM | POA: Diagnosis not present

## 2022-08-02 DIAGNOSIS — F331 Major depressive disorder, recurrent, moderate: Secondary | ICD-10-CM | POA: Diagnosis not present

## 2022-08-02 DIAGNOSIS — F4312 Post-traumatic stress disorder, chronic: Secondary | ICD-10-CM | POA: Diagnosis not present

## 2022-08-02 DIAGNOSIS — F411 Generalized anxiety disorder: Secondary | ICD-10-CM | POA: Diagnosis not present

## 2022-08-20 ENCOUNTER — Encounter: Payer: Self-pay | Admitting: *Deleted

## 2022-09-17 ENCOUNTER — Telehealth: Payer: Self-pay | Admitting: Obstetrics

## 2022-09-18 ENCOUNTER — Ambulatory Visit (INDEPENDENT_AMBULATORY_CARE_PROVIDER_SITE_OTHER): Payer: Medicaid Other | Admitting: Obstetrics and Gynecology

## 2022-09-18 VITALS — BP 125/81 | HR 73 | Wt 166.0 lb

## 2022-09-18 DIAGNOSIS — Z3046 Encounter for surveillance of implantable subdermal contraceptive: Secondary | ICD-10-CM

## 2022-09-18 DIAGNOSIS — Z30017 Encounter for initial prescription of implantable subdermal contraceptive: Secondary | ICD-10-CM | POA: Diagnosis not present

## 2022-09-18 MED ORDER — ETONOGESTREL 68 MG ~~LOC~~ IMPL
68.0000 mg | DRUG_IMPLANT | Freq: Once | SUBCUTANEOUS | Status: AC
  Administered 2022-09-18: 68 mg via SUBCUTANEOUS

## 2022-09-18 NOTE — Progress Notes (Signed)
     GYNECOLOGY OFFICE PROCEDURE NOTE  Tara Blake is a 26 y.o. [phone] here for Nexplanon removal and Nexplanon insertion.  Last pap smear was on 08/28/21 and was normal.  No other gynecologic concerns.  Nexplanon Removal and Reinsertion Patient identified, informed consent performed, consent signed.   Patient does understand that irregular bleeding is a very common side effect of this medication. She was advised to have backup contraception for one week after replacement of the implant. Pregnancy test in clinic today was negative.  Appropriate time out taken. Nexplanon site identified in left arm.  Area prepped in usual sterile fashon. One ml of 1% lidocaine was used to anesthetize the area at the distal end of the implant. A small stab incision was made right beside the implant on the distal portion. The Nexplanon rod was grasped using hemostats and removed without difficulty. There was minimal blood loss. There were no complications. Area was then injected with 3 ml of 1 % lidocaine. She was re-prepped with betadine, Nexplanon removed from packaging, Device confirmed in needle, then inserted full length of needle and withdrawn per handbook instructions. Nexplanon was able to palpated in the patient's arm; patient palpated the insert herself.  There was minimal blood loss. Patient insertion site covered with gauze and a pressure bandage to reduce any bruising. The patient tolerated the procedure well and was given post procedure instructions.  She was advised to have backup contraception for one week.      Mariel Aloe, MD, FACOG Obstetrician & Gynecologist, [facility], [facility]

## 2022-10-31 DIAGNOSIS — G44219 Episodic tension-type headache, not intractable: Secondary | ICD-10-CM | POA: Diagnosis not present

## 2022-10-31 DIAGNOSIS — R109 Unspecified abdominal pain: Secondary | ICD-10-CM | POA: Diagnosis not present

## 2022-10-31 DIAGNOSIS — R634 Abnormal weight loss: Secondary | ICD-10-CM | POA: Diagnosis not present

## 2022-10-31 DIAGNOSIS — R61 Generalized hyperhidrosis: Secondary | ICD-10-CM | POA: Diagnosis not present

## 2022-10-31 DIAGNOSIS — M533 Sacrococcygeal disorders, not elsewhere classified: Secondary | ICD-10-CM | POA: Diagnosis not present

## 2022-10-31 DIAGNOSIS — K219 Gastro-esophageal reflux disease without esophagitis: Secondary | ICD-10-CM | POA: Diagnosis not present

## 2022-10-31 DIAGNOSIS — R198 Other specified symptoms and signs involving the digestive system and abdomen: Secondary | ICD-10-CM | POA: Diagnosis not present

## 2022-10-31 DIAGNOSIS — J029 Acute pharyngitis, unspecified: Secondary | ICD-10-CM | POA: Diagnosis not present

## 2022-10-31 DIAGNOSIS — R195 Other fecal abnormalities: Secondary | ICD-10-CM | POA: Diagnosis not present

## 2022-10-31 DIAGNOSIS — K6289 Other specified diseases of anus and rectum: Secondary | ICD-10-CM | POA: Diagnosis not present

## 2022-10-31 DIAGNOSIS — R112 Nausea with vomiting, unspecified: Secondary | ICD-10-CM | POA: Diagnosis not present

## 2022-10-31 DIAGNOSIS — K921 Melena: Secondary | ICD-10-CM | POA: Diagnosis not present

## 2022-11-01 DIAGNOSIS — R112 Nausea with vomiting, unspecified: Secondary | ICD-10-CM | POA: Diagnosis not present

## 2022-11-01 DIAGNOSIS — R1013 Epigastric pain: Secondary | ICD-10-CM | POA: Diagnosis not present

## 2022-11-01 DIAGNOSIS — R197 Diarrhea, unspecified: Secondary | ICD-10-CM | POA: Diagnosis not present

## 2022-11-05 DIAGNOSIS — R1013 Epigastric pain: Secondary | ICD-10-CM | POA: Diagnosis not present

## 2022-11-05 DIAGNOSIS — K760 Fatty (change of) liver, not elsewhere classified: Secondary | ICD-10-CM | POA: Diagnosis not present

## 2022-11-06 DIAGNOSIS — K529 Noninfective gastroenteritis and colitis, unspecified: Secondary | ICD-10-CM | POA: Diagnosis not present

## 2022-11-09 DIAGNOSIS — R112 Nausea with vomiting, unspecified: Secondary | ICD-10-CM | POA: Diagnosis not present

## 2022-11-20 DIAGNOSIS — R634 Abnormal weight loss: Secondary | ICD-10-CM | POA: Diagnosis not present

## 2022-11-20 DIAGNOSIS — R1110 Vomiting, unspecified: Secondary | ICD-10-CM | POA: Diagnosis not present

## 2022-11-20 DIAGNOSIS — K912 Postsurgical malabsorption, not elsewhere classified: Secondary | ICD-10-CM | POA: Diagnosis not present

## 2022-11-20 DIAGNOSIS — R1013 Epigastric pain: Secondary | ICD-10-CM | POA: Diagnosis not present

## 2022-11-20 DIAGNOSIS — E559 Vitamin D deficiency, unspecified: Secondary | ICD-10-CM | POA: Diagnosis not present

## 2022-11-20 DIAGNOSIS — R197 Diarrhea, unspecified: Secondary | ICD-10-CM | POA: Diagnosis not present

## 2022-11-20 DIAGNOSIS — I10 Essential (primary) hypertension: Secondary | ICD-10-CM | POA: Diagnosis not present

## 2022-11-20 DIAGNOSIS — Z903 Acquired absence of stomach [part of]: Secondary | ICD-10-CM | POA: Diagnosis not present

## 2022-11-20 DIAGNOSIS — R110 Nausea: Secondary | ICD-10-CM | POA: Diagnosis not present

## 2022-11-27 DIAGNOSIS — R112 Nausea with vomiting, unspecified: Secondary | ICD-10-CM | POA: Diagnosis not present

## 2022-11-27 DIAGNOSIS — R1013 Epigastric pain: Secondary | ICD-10-CM | POA: Diagnosis not present

## 2022-12-07 DIAGNOSIS — M545 Low back pain, unspecified: Secondary | ICD-10-CM | POA: Diagnosis not present

## 2022-12-07 DIAGNOSIS — R110 Nausea: Secondary | ICD-10-CM | POA: Diagnosis not present

## 2022-12-07 DIAGNOSIS — R1033 Periumbilical pain: Secondary | ICD-10-CM | POA: Diagnosis not present

## 2022-12-07 DIAGNOSIS — K5900 Constipation, unspecified: Secondary | ICD-10-CM | POA: Diagnosis not present

## 2022-12-07 DIAGNOSIS — R1013 Epigastric pain: Secondary | ICD-10-CM | POA: Diagnosis not present

## 2022-12-13 ENCOUNTER — Emergency Department (HOSPITAL_COMMUNITY): Payer: Medicaid Other

## 2022-12-13 ENCOUNTER — Emergency Department (HOSPITAL_COMMUNITY)
Admission: EM | Admit: 2022-12-13 | Discharge: 2022-12-13 | Disposition: A | Discharge status: Home / Self Care | Payer: Medicaid Other | Attending: Emergency Medicine | Admitting: Emergency Medicine

## 2022-12-13 ENCOUNTER — Other Ambulatory Visit: Payer: Self-pay

## 2022-12-13 DIAGNOSIS — Z1152 Encounter for screening for COVID-19: Secondary | ICD-10-CM | POA: Diagnosis not present

## 2022-12-13 DIAGNOSIS — R197 Diarrhea, unspecified: Secondary | ICD-10-CM | POA: Diagnosis not present

## 2022-12-13 DIAGNOSIS — R0781 Pleurodynia: Secondary | ICD-10-CM | POA: Diagnosis not present

## 2022-12-13 DIAGNOSIS — R0602 Shortness of breath: Secondary | ICD-10-CM | POA: Diagnosis not present

## 2022-12-13 DIAGNOSIS — R1011 Right upper quadrant pain: Secondary | ICD-10-CM | POA: Diagnosis not present

## 2022-12-13 DIAGNOSIS — R1012 Left upper quadrant pain: Secondary | ICD-10-CM | POA: Diagnosis not present

## 2022-12-13 DIAGNOSIS — R000 Tachycardia, unspecified: Secondary | ICD-10-CM | POA: Insufficient documentation

## 2022-12-13 DIAGNOSIS — R109 Unspecified abdominal pain: Secondary | ICD-10-CM | POA: Diagnosis not present

## 2022-12-13 DIAGNOSIS — K529 Noninfective gastroenteritis and colitis, unspecified: Secondary | ICD-10-CM | POA: Diagnosis not present

## 2022-12-13 LAB — TROPONIN I (HIGH SENSITIVITY)
Troponin I (High Sensitivity): <2 ng/L (ref <18)
Troponin I (High Sensitivity): <2 ng/L (ref <18)

## 2022-12-13 LAB — CBC WITH DIFFERENTIAL/PLATELET
Abs Immature Granulocytes: 0.01 10*3/uL (ref 0.00–0.07)
Basophils Absolute: 0 10*3/uL (ref 0.0–0.1)
Basophils Relative: 1 %
Eosinophils Absolute: 0.2 10*3/uL (ref 0.0–0.5)
Eosinophils Relative: 4 %
HCT: 40.1 % (ref 36.0–46.0)
Hemoglobin: 12.8 g/dL (ref 12.0–15.0)
Immature Granulocytes: 0 %
Lymphocytes Relative: 40 %
Lymphs Abs: 2.2 10*3/uL (ref 0.7–4.0)
MCH: 25.6 pg — ABNORMAL LOW (ref 26.0–34.0)
MCHC: 31.9 g/dL (ref 30.0–36.0)
MCV: 80.2 fL (ref 80.0–100.0)
Monocytes Absolute: 0.7 10*3/uL (ref 0.1–1.0)
Monocytes Relative: 12 %
Neutro Abs: 2.4 10*3/uL (ref 1.7–7.7)
Neutrophils Relative %: 43 %
Platelets: 252 10*3/uL (ref 150–400)
RBC: 5 MIL/uL (ref 3.87–5.11)
RDW: 13.6 % (ref 11.5–15.5)
WBC: 5.6 10*3/uL (ref 4.0–10.5)
nRBC: 0 % (ref 0.0–0.2)

## 2022-12-13 LAB — RESP PANEL BY RT-PCR (RSV, FLU A&B, COVID)  RVPGX2
Influenza A by PCR: NEGATIVE
Influenza B by PCR: NEGATIVE
Resp Syncytial Virus by PCR: NEGATIVE
SARS Coronavirus 2 by RT PCR: NEGATIVE

## 2022-12-13 LAB — COMPREHENSIVE METABOLIC PANEL
ALT: 12 U/L (ref 0–44)
AST: 20 U/L (ref 15–41)
Albumin: 4.8 g/dL (ref 3.5–5.0)
Alkaline Phosphatase: 36 U/L — ABNORMAL LOW (ref 38–126)
Anion gap: 12 (ref 5–15)
BUN: 8 mg/dL (ref 6–20)
CO2: 22 mmol/L (ref 22–32)
Calcium: 9 mg/dL (ref 8.9–10.3)
Chloride: 104 mmol/L (ref 98–111)
Creatinine, Ser: 0.87 mg/dL (ref 0.44–1.00)
GFR, Estimated: >60 mL/min (ref >60)
Glucose, Bld: 81 mg/dL (ref 70–99)
Potassium: 3.8 mmol/L (ref 3.5–5.1)
Sodium: 138 mmol/L (ref 135–145)
Total Bilirubin: 0.5 mg/dL (ref 0.3–1.2)
Total Protein: 8 g/dL (ref 6.5–8.1)

## 2022-12-13 LAB — LIPASE, BLOOD: Lipase: 36 U/L (ref 11–51)

## 2022-12-13 LAB — I-STAT BETA HCG BLOOD, ED (MC, WL, AP ONLY): I-stat hCG, quantitative: 5.9 m[IU]/mL — ABNORMAL HIGH (ref <5)

## 2022-12-13 LAB — HCG, SERUM, QUALITATIVE: Preg, Serum: NEGATIVE

## 2022-12-13 MED ORDER — DICYCLOMINE HCL 20 MG PO TABS: 20.0000 mg | ORAL_TABLET | Freq: Two times a day (BID) | ORAL | 0 refills | Status: DC

## 2022-12-13 MED ORDER — ONDANSETRON HCL 4 MG/2ML IJ SOLN
4.0000 mg | Freq: Once | INTRAMUSCULAR | Status: AC
  Administered 2022-12-13: 4 mg via INTRAVENOUS
  Filled 2022-12-13: qty 2

## 2022-12-13 MED ORDER — MORPHINE SULFATE (PF) 4 MG/ML IV SOLN
4.0000 mg | Freq: Once | INTRAVENOUS | Status: AC
  Administered 2022-12-13: 4 mg via INTRAVENOUS
  Filled 2022-12-13: qty 1

## 2022-12-13 MED ORDER — IOHEXOL 350 MG/ML SOLN
100.0000 mL | Freq: Once | INTRAVENOUS | Status: AC | PRN
  Administered 2022-12-13: 100 mL via INTRAVENOUS

## 2022-12-13 NOTE — Progress Notes (Cosign Needed)
Patient had a positive felt to be false negative provider Sherrill Raring PA-C stated it was ok to proceed with CT Abdomen and pelvis

## 2022-12-13 NOTE — Discharge Instructions (Addendum)
Your workup today was reassuring as we discussed, please follow-up with your primary care doctor and GI doctor as you are doing to workup the etiology.  Return to the ED for fevers, severe pain, vomiting, loss of bladder or bowel function, new or concerning symptoms.

## 2022-12-13 NOTE — ED Triage Notes (Addendum)
C/o sob, headache, chills, bodyaches, and pt having blurry vision with spots 30 mins PTA that is worse with activity.  Colonoscopy this am @ 0830.  Denies cp. Pt talking in full sentences w/o distress

## 2022-12-13 NOTE — ED Provider Notes (Signed)
[facility] Provider Note   CSN: [id number] Arrival date & time: 12/13/22  1227     History  Chief Complaint  Patient presents with   Shortness of Breath    Tara Blake is a 27 y.o. female.   Shortness of Breath    This is a 27 year old female with history of MDD, sleeve gastrectomy, diarrhea/rectal and vaginal bleeding followed by GI presenting to the emergency department due to abdominal pain and shortness of breath.  Patient had a colonoscopy at 830 this morning, was discharged at 10 AM.  States fall out she has been having pleuritic chest pain for the last few hours as well as upper abdominal pain inferior to the ribs bilaterally without radiation.  Is worse with inspiration, she is also endorsing substernal chest pain which radiates up from her diaphragm.  She has On birth control, not on any oral medicine.  Not on blood thinners, no history of PE or ACS.  Home Medications Prior to Admission medications   Medication Sig Start Date End Date Taking? Authorizing Provider  dicyclomine (BENTYL) 20 MG tablet Take 1 tablet (20 mg total) by mouth 2 (two) times daily. 12/13/22  Yes Sherrill Raring, PA-C  acetaminophen (TYLENOL) 325 MG tablet Take 650 mg by mouth every 6 (six) hours as needed for moderate pain or headache.    [provider]  busPIRone (BUSPAR) 7.5 MG tablet Take 7.5 mg by mouth 2 (two) times daily. 01/15/22   [provider]  gabapentin (NEURONTIN) 300 MG capsule Take 300 mg by mouth 2 (two) times daily.    [provider]  hydrOXYzine (ATARAX) 25 MG tablet Take 25 mg by mouth 2 (two) times daily. 01/15/22   [provider]  Multiple Vitamin (MULTIVITAMIN) tablet Take 1 tablet by mouth daily.    [provider]  sertraline (ZOLOFT) 50 MG tablet Take 50 mg by mouth at bedtime. 01/15/22   [provider]  VENTOLIN HFA 108 (90 Base) MCG/ACT inhaler Inhale 1-2 puffs into the  lungs. For panic attacks 09/13/21   [provider]      Allergies    Amoxicillin    Review of Systems   Review of Systems  Respiratory:  Positive for shortness of breath.     Physical Exam Updated Vital Signs BP (!) 134/102   Pulse 78   Temp 98.3 F (36.8 C) (Oral)   Resp 19   Wt 75 kg   SpO2 100%   BMI 28.38 kg/m  Physical Exam Vitals and nursing note reviewed. Exam conducted with a chaperone present.  Constitutional:      Appearance: Normal appearance.  HENT:     Head: Normocephalic and atraumatic.  Eyes:     General: No scleral icterus.       Right eye: No discharge.        Left eye: No discharge.     Extraocular Movements: Extraocular movements intact.     Pupils: Pupils are equal, round, and reactive to light.  Cardiovascular:     Rate and Rhythm: Regular rhythm. Tachycardia present.     Pulses: Normal pulses.     Heart sounds: Normal heart sounds. No murmur heard.    No friction rub. No gallop.  Pulmonary:     Effort: Pulmonary effort is normal. No respiratory distress.     Breath sounds: Normal breath sounds.  Abdominal:     General: Abdomen is flat. Bowel sounds are normal. There is  no distension.     Palpations: Abdomen is soft.     Tenderness: There is abdominal tenderness.     Comments: Slight TTP left upper quadrant and right upper quadrant.  No CVA tenderness.  Skin:    General: Skin is warm and dry.     Coloration: Skin is not jaundiced.  Neurological:     Mental Status: She is alert. Mental status is at baseline.     Coordination: Coordination normal.     ED Results / Procedures / Treatments   Labs (all labs ordered are listed, but only abnormal results are displayed) Labs Reviewed  CBC WITH DIFFERENTIAL/PLATELET - Abnormal; Notable for the following components:      Result Value   MCH 25.6 (*)    All other components within normal limits  COMPREHENSIVE METABOLIC PANEL - Abnormal; Notable for the following components:    Alkaline Phosphatase 36 (*)    All other components within normal limits  I-STAT BETA HCG BLOOD, ED ([facility], [facility], [facility] ONLY) - Abnormal; Notable for the following components:   I-stat hCG, quantitative 5.9 (*)    All other components within normal limits  RESP PANEL BY RT-PCR (RSV, FLU A&B, COVID)  RVPGX2  LIPASE, BLOOD  HCG, SERUM, QUALITATIVE  TROPONIN I (HIGH SENSITIVITY)  TROPONIN I (HIGH SENSITIVITY)    EKG EKG Interpretation  Date/Time:  Thursday December 13 2022 12:36:21 EST Ventricular Rate:  107 PR Interval:  190 QRS Duration: 83 QT Interval:  338 QTC Calculation: 451 R Axis:   83 Text Interpretation: Sinus tachycardia Borderline T wave abnormalities Confirmed by Pattricia Boss [phone]) on 12/13/2022 12:38:57 PM  Radiology CT Abdomen Pelvis W Contrast  Result Date: 12/13/2022 CLINICAL DATA:  Abdominal pain postoperatively. Shortness of breath, headache, chills, and body aches. Colonoscopy this morning. EXAM: CT ABDOMEN AND PELVIS WITH CONTRAST TECHNIQUE: Multidetector CT imaging of the abdomen and pelvis was performed using the standard protocol following bolus administration of intravenous contrast. RADIATION DOSE REDUCTION: This exam was performed according to the departmental dose-optimization program which includes automated exposure control, adjustment of the mA and/or kV according to patient size and/or use of iterative reconstruction technique. CONTRAST:  176m OMNIPAQUE IOHEXOL 350 MG/ML SOLN COMPARISON:  06/08/2022 FINDINGS: Lower chest: Lung bases are clear. Hepatobiliary: No focal liver abnormality is seen. No gallstones, gallbladder wall thickening, or biliary dilatation. Pancreas: Unremarkable. No pancreatic ductal dilatation or surrounding inflammatory changes. Spleen: Normal in size without focal abnormality. Adrenals/Urinary Tract: Adrenal glands are unremarkable. Kidneys are normal, without renal calculi, focal lesion, or hydronephrosis. Bladder is unremarkable.  Stomach/Bowel: Stomach, small bowel, and colon are not abnormally distended. No wall thickening or inflammatory changes appreciated. Postoperative changes consistent with gastric bypass. Appendix is normal. Vascular/Lymphatic: No significant vascular findings are present. No enlarged abdominal or pelvic lymph nodes. Reproductive: Normal size and appearance of the uterus. No abnormal adnexal masses. Simple appearing cyst on the right ovary measuring 3.1 cm diameter, likely functional. No follow-up imaging recommended. Note: This recommendation does not apply to premenarchal patients and to those with increased risk (genetic, family history, elevated tumor markers or other high-risk factors) of ovarian cancer. Reference: JACR 2020 Feb; 17(2):248-254 Other: No free air or free fluid in the abdomen. Abdominal wall musculature appears intact. No abnormal abdominal or pelvic collections. Musculoskeletal: No acute or significant osseous findings. IMPRESSION: 1. No acute process demonstrated in the abdomen or pelvis. No evidence of bowel obstruction or inflammation. 2. No free intraperitoneal air or abscess identified. Electronically Signed  By: Lucienne Capers M.D.   On: 12/13/2022 19:30   CT Angio Chest PE W/Cm &/Or Wo Cm  Result Date: 12/13/2022 CLINICAL DATA:  Shortness of breath, headache, chills and blurry vision EXAM: CT ANGIOGRAPHY CHEST WITH CONTRAST TECHNIQUE: Multidetector CT imaging of the chest was performed using the standard protocol during bolus administration of intravenous contrast. Multiplanar CT image reconstructions and MIPs were obtained to evaluate the vascular anatomy. RADIATION DOSE REDUCTION: This exam was performed according to the departmental dose-optimization program which includes automated exposure control, adjustment of the mA and/or kV according to patient size and/or use of iterative reconstruction technique. CONTRAST:  124m OMNIPAQUE IOHEXOL 350 MG/ML SOLN COMPARISON:  None  Available. FINDINGS: Cardiovascular: Heart is nonenlarged. Trace pericardial fluid. There is pulsation artifact along the ascending aorta. Grossly the thoracic aorta has a normal course and caliber. No pulmonary embolism identified. Mediastinum/Nodes: No specific abnormal lymph node enlargement identified in the axillary regions, hilum or mediastinum. Normal caliber thoracic esophagus. Lungs/Pleura: No consolidation, pneumothorax or effusion. There is tiny 3 mm right apical noncalcified lung nodule on series 8, image 25. Punctate nodule middle lobe on image 65, 2 mm. Additional small focus subpleural anterior measuring 2-3 mm on image 69. Upper Abdomen: Adrenal glands in the upper abdomen are preserved. Surgical changes from gastric sleeve. 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 Musculoskeletal: Mild degenerative changes along the spine. Review of the MIP images confirms the above findings. IMPRESSION: No pulmonary embolism identified. Few tiny sub 5 mm lung nodules No follow-up needed if patient is low-risk (and has no known or suspected primary neoplasm). Non-contrast chest CT can be considered in 12 months if patient is high-risk. This recommendation follows the consensus statement: Guidelines for Management of Incidental Pulmonary Nodules Detected on CT Images: From the Fleischner Society 2017; Radiology 2017; 284:228-243. Please see separate dictation abdomen and pelvis CT from same day Electronically Signed   By: AJill SideM.D.   On: 12/13/2022 19:29   DG Abdomen Acute W/Chest  Result Date: 12/13/2022 CLINICAL DATA:  Pain EXAM: DG ABDOMEN ACUTE WITH 1 VIEW CHEST COMPARISON:  CT 06/08/2022 FINDINGS: No consolidation, pneumothorax or effusion. Normal cardiopericardial silhouette without edema. Overlapping cardiac leads. Gas is seen along nondilated loops of small and large bowel. No obstruction. Surgical changes in the upper abdomen just left of midline. Please  correlate with history. No definite free air seen beneath the diaphragm. There are some small well rounded densities in the pelvis which are indeterminate although possibly vascular. Question fold thickening versus underdistention along the ascending colon. Vascular calcifications in the pelvis. IMPRESSION: Surgical changes from previous gastric sleeve. No obstruction or free air. Question ascending colon fold thickening versus underdistention. Please correlate with symptoms. No acute cardiopulmonary disease. Electronically Signed   By: AJill SideM.D.   On: 12/13/2022 13:14    Procedures Procedures    Medications Ordered in ED Medications  morphine (PF) 4 MG/ML injection 4 mg (4 mg Intravenous Given 12/13/22 1510)  ondansetron (ZOFRAN) injection 4 mg (4 mg Intravenous Given 12/13/22 1509)  iohexol (OMNIPAQUE) 350 MG/ML injection 100 mL (100 mLs Intravenous Contrast Given 12/13/22 1857)    ED Course/ Medical Decision Making/ A&P Clinical Course as of 12/13/22 2107  Thu Dec 13, 2022  1443 I-Stat beta hCG blood, ED(!) I-STAT beta-hCG is slightly elevated at 5.9 which I think is falsely elevated. discussed this result with the patient.  She has a nexplanon, I think the  chance of patient being pregnant is very low and I think the benefit of proceeding with imaging would be better.  Discussed risk and benefits and we will proceed with scans.  [HS]  [id number] I reevaluated patient, patient feels improved after pain medicine.  She is concerned that the hCG was very mildly elevated and is requesting hCG qualitative which I have ordered. [HS]    Clinical Course User Index [HS] Sherrill Raring, PA-C                             Medical Decision Making Amount and/or Complexity of Data Reviewed Labs: ordered. Decision-making details documented in ED Course. Radiology: ordered.  Risk Prescription drug management.   This is a 27 year old female presenting to the emergency department due to chest pain,  shortness of breath and upper abdominal pain.  Differential is broad and includes perforation, ileus, PE, ACS, electrolyte derangement, AKI, ectopic pregnancy, pneumonia, viral infection.  Patient and spouse at bedside providing independent history.  Also reviewed external medical records including GI notes.  Order abdominal labs, troponin, CT abdomen and pelvis as well as CTA PE study to evaluate for complications with colonoscopy as well as concern for possible PE given tachycardia, pleuritic chest pain, recent procedure and recent, no well-controlled.  I ordered morphine and Zofran for pain.  Patient is not pregnant, not direct ectopic.  No leukocytosis or anemia, no gross electrolyte derangement or AKI, COVID and flu negative, lipase within normal limits, troponin is nondetectable.    CT abdomen pelvis is negative for any acute process, specifically no perforation or ileus.  CT PCP study is also negative for PE.  We discussed the incidental finding of cyst on her right ovary as well as lung nodules, she will follow-up with her PCP for recheck monitoring.  Will discharge on Bentyl, given reassuring workup, stable vital signs I do feel she is appropriate for close outpatient follow-up.  Return precautions discussed, discharged in stable condition.         Final Clinical Impression(s) / ED Diagnoses Final diagnoses:  Shortness of breath    Rx / DC Orders ED Discharge Orders          Ordered    dicyclomine (BENTYL) 20 MG tablet  2 times daily        12/13/22 1947              Sherrill Raring, Hershal Coria 12/13/22 2107    Tretha Sciara, MD 12/14/22 1710

## 2022-12-31 DIAGNOSIS — Z903 Acquired absence of stomach [part of]: Secondary | ICD-10-CM | POA: Diagnosis not present

## 2022-12-31 DIAGNOSIS — K912 Postsurgical malabsorption, not elsewhere classified: Secondary | ICD-10-CM | POA: Diagnosis not present

## 2023-01-01 DIAGNOSIS — R109 Unspecified abdominal pain: Secondary | ICD-10-CM | POA: Diagnosis not present

## 2023-01-01 DIAGNOSIS — M2550 Pain in unspecified joint: Secondary | ICD-10-CM | POA: Diagnosis not present

## 2023-01-01 DIAGNOSIS — H539 Unspecified visual disturbance: Secondary | ICD-10-CM | POA: Diagnosis not present

## 2023-01-01 DIAGNOSIS — N939 Abnormal uterine and vaginal bleeding, unspecified: Secondary | ICD-10-CM | POA: Diagnosis not present

## 2023-01-01 DIAGNOSIS — G44221 Chronic tension-type headache, intractable: Secondary | ICD-10-CM | POA: Diagnosis not present

## 2023-01-01 DIAGNOSIS — R5383 Other fatigue: Secondary | ICD-10-CM | POA: Diagnosis not present

## 2023-01-01 DIAGNOSIS — M545 Low back pain, unspecified: Secondary | ICD-10-CM | POA: Diagnosis not present

## 2023-01-01 DIAGNOSIS — R454 Irritability and anger: Secondary | ICD-10-CM | POA: Diagnosis not present

## 2023-01-01 DIAGNOSIS — R112 Nausea with vomiting, unspecified: Secondary | ICD-10-CM | POA: Diagnosis not present

## 2023-01-01 DIAGNOSIS — R413 Other amnesia: Secondary | ICD-10-CM | POA: Diagnosis not present

## 2023-01-10 DIAGNOSIS — R110 Nausea: Secondary | ICD-10-CM | POA: Diagnosis not present

## 2023-01-10 DIAGNOSIS — R1033 Periumbilical pain: Secondary | ICD-10-CM | POA: Diagnosis not present

## 2023-01-10 DIAGNOSIS — K529 Noninfective gastroenteritis and colitis, unspecified: Secondary | ICD-10-CM | POA: Diagnosis not present

## 2023-01-13 DIAGNOSIS — H5213 Myopia, bilateral: Secondary | ICD-10-CM | POA: Diagnosis not present

## 2023-01-16 ENCOUNTER — Ambulatory Visit: Payer: Medicaid Other | Admitting: Obstetrics and Gynecology

## 2023-02-01 DIAGNOSIS — Z903 Acquired absence of stomach [part of]: Secondary | ICD-10-CM | POA: Diagnosis not present

## 2023-02-01 DIAGNOSIS — H43393 Other vitreous opacities, bilateral: Secondary | ICD-10-CM | POA: Diagnosis not present

## 2023-02-01 DIAGNOSIS — K912 Postsurgical malabsorption, not elsewhere classified: Secondary | ICD-10-CM | POA: Diagnosis not present

## 2023-02-01 DIAGNOSIS — L659 Nonscarring hair loss, unspecified: Secondary | ICD-10-CM | POA: Diagnosis not present

## 2023-02-01 DIAGNOSIS — E611 Iron deficiency: Secondary | ICD-10-CM | POA: Diagnosis not present

## 2023-02-07 DIAGNOSIS — M5003 Cervical disc disorder with myelopathy, cervicothoracic region: Secondary | ICD-10-CM | POA: Diagnosis not present

## 2023-02-07 DIAGNOSIS — M50023 Cervical disc disorder at C6-C7 level with myelopathy: Secondary | ICD-10-CM | POA: Diagnosis not present

## 2023-02-07 DIAGNOSIS — G894 Chronic pain syndrome: Secondary | ICD-10-CM | POA: Diagnosis not present

## 2023-02-07 DIAGNOSIS — M47812 Spondylosis without myelopathy or radiculopathy, cervical region: Secondary | ICD-10-CM | POA: Diagnosis not present

## 2023-02-07 DIAGNOSIS — M50022 Cervical disc disorder at C5-C6 level with myelopathy: Secondary | ICD-10-CM | POA: Diagnosis not present

## 2023-02-07 DIAGNOSIS — M797 Fibromyalgia: Secondary | ICD-10-CM | POA: Diagnosis not present

## 2023-02-07 DIAGNOSIS — M47816 Spondylosis without myelopathy or radiculopathy, lumbar region: Secondary | ICD-10-CM | POA: Diagnosis not present

## 2023-02-07 DIAGNOSIS — M4712 Other spondylosis with myelopathy, cervical region: Secondary | ICD-10-CM | POA: Diagnosis not present

## 2023-02-27 DIAGNOSIS — R790 Abnormal level of blood mineral: Secondary | ICD-10-CM | POA: Diagnosis not present

## 2023-02-27 DIAGNOSIS — K912 Postsurgical malabsorption, not elsewhere classified: Secondary | ICD-10-CM | POA: Diagnosis not present

## 2023-02-27 DIAGNOSIS — N921 Excessive and frequent menstruation with irregular cycle: Secondary | ICD-10-CM | POA: Diagnosis not present

## 2023-02-27 DIAGNOSIS — E611 Iron deficiency: Secondary | ICD-10-CM | POA: Insufficient documentation

## 2023-02-27 DIAGNOSIS — Z903 Acquired absence of stomach [part of]: Secondary | ICD-10-CM | POA: Diagnosis not present

## 2023-03-04 ENCOUNTER — Encounter: Payer: Self-pay | Admitting: Advanced Practice Midwife

## 2023-03-04 ENCOUNTER — Ambulatory Visit (INDEPENDENT_AMBULATORY_CARE_PROVIDER_SITE_OTHER): Payer: Medicaid Other | Admitting: Advanced Practice Midwife

## 2023-03-04 VITALS — BP 122/82 | HR 75 | Ht 64.0 in | Wt 156.6 lb

## 2023-03-04 DIAGNOSIS — Z3046 Encounter for surveillance of implantable subdermal contraceptive: Secondary | ICD-10-CM

## 2023-03-04 DIAGNOSIS — Z1339 Encounter for screening examination for other mental health and behavioral disorders: Secondary | ICD-10-CM | POA: Diagnosis not present

## 2023-03-04 NOTE — Progress Notes (Signed)
Pt presents for AEX. Requesting Nexplanon removal. Nexplanon place 08/2022. Pt trying to conceive this year. Pt c/o UTI symptoms.

## 2023-03-04 NOTE — Progress Notes (Signed)
GYNECOLOGY CLINIC PROCEDURE NOTE  Edelmira Lorenz is a 27 y.o. [phone] here for Nexplanon removal.  Last pap smear was on 08/28/21 and was abnormal with ASCUS, negative HPV, repeat in 3 years per [facility].  No other gynecologic concerns.  Nexplanon Removal Patient identified, informed consent performed, consent signed.   Appropriate time out taken. Nexplanon site identified.  Area prepped in usual sterile fashon. One ml of 1% lidocaine was used to anesthetize the area at the distal end of the implant. A small stab incision was made right beside the implant on the distal portion.  The Nexplanon rod was grasped using hemostats and removed without difficulty.  There was minimal blood loss. There were no complications.  3 ml of 1% lidocaine was injected around the incision for post-procedure analgesia.  Steri-strips were applied over the small incision.  A pressure bandage was applied to reduce any bruising.  The patient tolerated the procedure well and was given post procedure instructions.  Patient is planning to use condoms for a short time, then to attempt conception.  No follow-ups on file.   Sharen Counter, CNM 2:04 PM

## 2023-03-06 DIAGNOSIS — E611 Iron deficiency: Secondary | ICD-10-CM | POA: Diagnosis not present

## 2023-03-21 DIAGNOSIS — M47814 Spondylosis without myelopathy or radiculopathy, thoracic region: Secondary | ICD-10-CM | POA: Diagnosis not present

## 2023-03-21 DIAGNOSIS — G894 Chronic pain syndrome: Secondary | ICD-10-CM | POA: Diagnosis not present

## 2023-03-21 DIAGNOSIS — M47816 Spondylosis without myelopathy or radiculopathy, lumbar region: Secondary | ICD-10-CM | POA: Diagnosis not present

## 2023-03-21 DIAGNOSIS — M797 Fibromyalgia: Secondary | ICD-10-CM | POA: Diagnosis not present

## 2023-03-21 DIAGNOSIS — M47812 Spondylosis without myelopathy or radiculopathy, cervical region: Secondary | ICD-10-CM | POA: Diagnosis not present

## 2023-04-16 ENCOUNTER — Ambulatory Visit (INDEPENDENT_AMBULATORY_CARE_PROVIDER_SITE_OTHER): Payer: Medicaid Other | Admitting: *Deleted

## 2023-04-16 VITALS — BP 123/90 | HR 81 | Ht 64.0 in | Wt 156.1 lb

## 2023-04-16 DIAGNOSIS — Z3202 Encounter for pregnancy test, result negative: Secondary | ICD-10-CM

## 2023-04-16 DIAGNOSIS — N912 Amenorrhea, unspecified: Secondary | ICD-10-CM | POA: Diagnosis not present

## 2023-04-16 DIAGNOSIS — O219 Vomiting of pregnancy, unspecified: Secondary | ICD-10-CM

## 2023-04-16 MED ORDER — PROMETHAZINE HCL 25 MG PO TABS: 25.0000 mg | ORAL_TABLET | Freq: Four times a day (QID) | ORAL | 1 refills | Status: DC | PRN

## 2023-04-16 NOTE — Progress Notes (Signed)
Ms. Coda presents today for UPT. She has no unusual complaints. LMP: 02/11/23, Nexplanon removed 03/04/23 (after 6mos of use)    OBJECTIVE: Appears well, in no apparent distress.  OB History     Gravida  3   Para  3   Term  3   Preterm      AB      Living  3      SAB      IAB      Ectopic      Multiple  0   Live Births  3          Home UPT Result: Positive In-Office UPT result: Negative I have reviewed the patient's medical, obstetrical, social, and family histories, and medications.   ASSESSMENT: Positive pregnancy test at home, negative in the office.  PLAN Advised pt to return to office in 2 weeks for repeat UPT as early pregnancy is suspected. SAB/ Ectopic and extreme N/V precautions and MAU location given. RX for nausea provided. Return appt scheduled at check out.

## 2023-04-23 ENCOUNTER — Inpatient Hospital Stay (HOSPITAL_COMMUNITY)
Admission: AD | Admit: 2023-04-23 | Discharge: 2023-04-23 | Disposition: A | Discharge status: Home / Self Care | Payer: Medicaid Other | Attending: Obstetrics & Gynecology | Admitting: Obstetrics & Gynecology

## 2023-04-23 DIAGNOSIS — Z3202 Encounter for pregnancy test, result negative: Secondary | ICD-10-CM

## 2023-04-23 DIAGNOSIS — N939 Abnormal uterine and vaginal bleeding, unspecified: Secondary | ICD-10-CM | POA: Diagnosis present

## 2023-04-23 LAB — POCT PREGNANCY, URINE: Preg Test, Ur: NEGATIVE

## 2023-04-23 LAB — HCG, QUANTITATIVE, PREGNANCY: hCG, Beta Chain, Quant, S: <1 m[IU]/mL (ref <5)

## 2023-04-23 NOTE — MAU Note (Signed)
.  Tara Blake is a 27 y.o. at Unknown here in MAU reporting: Had positive HPt last week. Had a negative test at the doctors office. Started having heavier bleeding this weekend. Passed some clots over the weekend and having increased bleeding today and cramping.  LMP: 4/15/249 had (explanon removed 03/04/23 no period since that) Onset of complaint: Sat Pain score: 6 Vitals:   04/23/23 1324  BP: 127/86  Pulse: 84  Resp: 18  Temp: 98 F (36.7 C)     FHT:n/a  Lab orders placed from triage:  UPT

## 2023-04-23 NOTE — MAU Provider Note (Signed)
Event Date/Time   First Provider Initiated Contact with Patient 04/23/23 1508      S [name] is a 27 y.o. [phone] patient who presents to MAU today with complaint of (+) HPTs; "a few different brands". She reports "they all had faint lines." She took them all between the dates of 6/14 and 6/16. She had a negative UPT at [facility]. She reports she started having heavy VB and passing blood clots this weekend. Today she reports the VB increased and lower abdominal cramping started. She rates the pain 6/10. She had Nexplanon removed on 03/04/2023. She has not had a period since the removal. She and her partner are trying to conceive.   O BP 127/86   Pulse 84   Temp 98 F (36.7 C)   Resp 18   Ht 5\' 4"  (1.626 m)   Wt 71.7 kg   LMP 02/10/2022 (Exact Date) Comment: Nexplanon removed 03/04/23 (in 6 mos)  BMI 27.12 kg/m   Physical Exam Vitals and nursing note reviewed.  Constitutional:      Appearance: Normal appearance. She is obese.  Pulmonary:     Effort: Pulmonary effort is normal.  Genitourinary:    Comments: Not indicated Neurological:     Mental Status: She is alert and oriented to person, place, and time.  Psychiatric:        Mood and Affect: Mood normal.        Behavior: Behavior normal.        Thought Content: Thought content normal.        Judgment: Judgment normal.    UPT = NEGATIVE Results for orders placed or performed during the hospital encounter of 04/23/23 (from the past 24 hour(s))  hCG, quantitative, pregnancy     Status: None   Collection Time: 04/23/23  2:22 PM  Result Value Ref Range   hCG, Beta Chain, Quant, S <1 <5 mIU/mL    A Medical screening exam complete 1. Negative pregnancy test    P Discharge from MAU in stable condition Patient given the option to follow-up with [facility] prn Warning signs for worsening condition that would warrant emergency follow-up discussed Patient may return to MAU as needed for pregnancy issues/concerns  Raelyn Mora, CNM 04/23/2023 3:08 PM

## 2023-04-23 NOTE — MAU Note (Signed)
MAU pregnancy test was negative. Unable to record in Lab results.

## 2023-04-30 ENCOUNTER — Emergency Department (HOSPITAL_COMMUNITY): Payer: Medicaid Other

## 2023-04-30 ENCOUNTER — Encounter (HOSPITAL_COMMUNITY): Payer: Self-pay | Admitting: Emergency Medicine

## 2023-04-30 ENCOUNTER — Emergency Department (HOSPITAL_COMMUNITY)
Admission: EM | Admit: 2023-04-30 | Discharge: 2023-04-30 | Disposition: A | Discharge status: Home / Self Care | Payer: Medicaid Other | Attending: Student in an Organized Health Care Education/Training Program | Admitting: Student in an Organized Health Care Education/Training Program

## 2023-04-30 ENCOUNTER — Other Ambulatory Visit: Payer: Self-pay

## 2023-04-30 ENCOUNTER — Ambulatory Visit: Payer: Medicaid Other

## 2023-04-30 DIAGNOSIS — R102 Pelvic and perineal pain: Secondary | ICD-10-CM | POA: Insufficient documentation

## 2023-04-30 DIAGNOSIS — N83202 Unspecified ovarian cyst, left side: Secondary | ICD-10-CM | POA: Diagnosis not present

## 2023-04-30 LAB — URINALYSIS, ROUTINE W REFLEX MICROSCOPIC
Bilirubin Urine: NEGATIVE
Glucose, UA: NEGATIVE mg/dL
Hgb urine dipstick: NEGATIVE
Ketones, ur: NEGATIVE mg/dL
Nitrite: NEGATIVE
Protein, ur: NEGATIVE mg/dL
Specific Gravity, Urine: 1.011 (ref 1.005–1.030)
pH: 5 (ref 5.0–8.0)

## 2023-04-30 LAB — WET PREP, GENITAL
Clue Cells Wet Prep HPF POC: NONE SEEN
Sperm: NONE SEEN
Trich, Wet Prep: NONE SEEN
WBC, Wet Prep HPF POC: >=10 — AB (ref <10)
Yeast Wet Prep HPF POC: NONE SEEN

## 2023-04-30 LAB — PREGNANCY, URINE: Preg Test, Ur: NEGATIVE

## 2023-04-30 MED ORDER — ACETAMINOPHEN 500 MG PO TABS
1000.0000 mg | ORAL_TABLET | Freq: Once | ORAL | Status: AC
  Administered 2023-04-30: 1000 mg via ORAL
  Filled 2023-04-30: qty 2

## 2023-04-30 MED ORDER — IBUPROFEN 800 MG PO TABS
800.0000 mg | ORAL_TABLET | Freq: Once | ORAL | Status: AC
  Administered 2023-04-30: 800 mg via ORAL
  Filled 2023-04-30: qty 1

## 2023-04-30 NOTE — ED Triage Notes (Signed)
Pt reports pelvic pressure for 3 days. States in the past, it has hurt after intercourse. Reports increased discharge, clear or white colored. Painful urination.

## 2023-04-30 NOTE — ED Provider Notes (Signed)
[place] EMERGENCY DEPARTMENT AT [facility] Provider Note   CSN: [id number] Arrival date & time: 04/30/23  5621     History  Chief Complaint  Patient presents with   Pelvic Pain    Tara Blake is a 27 y.o. female.  27 year old female presenting to the emergency department for evaluation of pelvic discomfort x 3 days.  Patient reports pelvic pressure since having intercourse 3 days ago.  She also reports some dysuria and increased clear vaginal discharge.  She denies any known pregnancy or prior STDs.  She denies any significant past medical history or surgical history.  She reports her last menstrual cycle was last week.  She denies any new sexual partners, genital ulcers, difficulty with bowel movements, fevers, or prior GU history.   Pelvic Pain       Home Medications Prior to Admission medications   Medication Sig Start Date End Date Taking? Authorizing Provider  acetaminophen (TYLENOL) 325 MG tablet Take 650 mg by mouth every 6 (six) hours as needed for moderate pain or headache.    [provider]  dicyclomine (BENTYL) 20 MG tablet Take 1 tablet (20 mg total) by mouth 2 (two) times daily. Patient not taking: Reported on 04/16/2023 12/13/22   Theron Arista, PA-C  Multiple Vitamin (MULTIVITAMIN) tablet Take 1 tablet by mouth daily.    [provider]  omeprazole (PRILOSEC) 20 MG capsule Take 20 mg by mouth daily. Patient not taking: Reported on 04/16/2023    [provider]  pregabalin (LYRICA) 25 MG capsule Take 25 mg by mouth 2 (two) times daily. Patient not taking: Reported on 04/16/2023    [provider]  promethazine (PHENERGAN) 25 MG tablet Take 1 tablet (25 mg total) by mouth every 6 (six) hours as needed for nausea or vomiting. 04/16/23   Hermina Staggers, MD  tiZANidine (ZANAFLEX) 2 MG tablet Take 2 mg by mouth every 8 (eight) hours as needed for muscle spasms. Patient not taking: Reported on 04/16/2023 03/05/23   [provider]  VENTOLIN HFA 108 (90 Base) MCG/ACT inhaler Inhale 1-2 puffs into the lungs. For panic attacks 09/13/21   [provider]      Allergies    Amoxicillin    Review of Systems   Review of Systems  Genitourinary:  Positive for pelvic pain.  All other systems reviewed and are negative.   Physical Exam Updated Vital Signs BP (!) 117/90   Pulse 82   Temp 98 F (36.7 C) (Oral)   Resp 18   Ht 5\' 4"  (1.626 m)   Wt 71 kg   LMP 03/24/2022 (Approximate) Comment: Nexplanon removed 03/04/23 (in 6 mos)  SpO2 98%   BMI 26.87 kg/m  Physical Exam Vitals and nursing note reviewed. Exam conducted with a chaperone present.  Constitutional:      General: She is not in acute distress.    Appearance: She is not toxic-appearing.  HENT:     Head: Normocephalic.     Nose: Nose normal.     Mouth/Throat:     Mouth: Mucous membranes are moist.  Eyes:     Extraocular Movements: Extraocular movements intact.     Conjunctiva/sclera: Conjunctivae normal.     Pupils: Pupils are equal, round, and reactive to light.  Cardiovascular:     Rate and Rhythm: Normal rate.  Pulmonary:     Effort: Pulmonary effort is normal.     Breath sounds: Normal breath sounds.  Abdominal:  General: Abdomen is flat. There is no distension.     Palpations: Abdomen is soft. There is no mass.     Tenderness: There is no abdominal tenderness. There is no right CVA tenderness, left CVA tenderness, guarding or rebound.     Hernia: No hernia is present.  Genitourinary:    General: Normal vulva.     Labia:        Right: No lesion.        Left: No lesion.      Vagina: Normal.     Cervix: Normal.     Uterus: Normal. Not tender and no uterine prolapse.      Adnexa: Right adnexa normal and left adnexa normal.  Musculoskeletal:        General: Normal range of motion.     Cervical back: Neck supple.  Skin:    General: Skin is warm.     Capillary Refill: Capillary refill takes less than 2 seconds.   Neurological:     General: No focal deficit present.     Mental Status: She is alert.     ED Results / Procedures / Treatments   Labs (all labs ordered are listed, but only abnormal results are displayed) Labs Reviewed  WET PREP, GENITAL - Abnormal; Notable for the following components:      Result Value   WBC, Wet Prep HPF POC >=10 (*)    All other components within normal limits  URINALYSIS, ROUTINE W REFLEX MICROSCOPIC - Abnormal; Notable for the following components:   Leukocytes,Ua TRACE (*)    Bacteria, UA RARE (*)    All other components within normal limits  PREGNANCY, URINE  GC/CHLAMYDIA PROBE AMP ([place]) NOT AT [facility]    EKG None  Radiology US Pelvis Complete  Result Date: 04/30/2023 CLINICAL DATA:  Pelvic pain and pressure for 3 days EXAM: TRANSABDOMINAL ULTRASOUND OF PELVIS DOPPLER ULTRASOUND OF OVARIES TECHNIQUE: Transabdominal ultrasound examination of the pelvis was performed. Patient declined transvaginal examination. Transabdominal technique was performed for global imaging of the pelvis including uterus, ovaries, adnexal regions, and pelvic cul-de-sac. Color and duplex Doppler ultrasound was utilized to evaluate blood flow to the ovaries. COMPARISON:  None Available. FINDINGS: Uterus Measurements: 8.0 x 4.2 x 5.8 cm = volume: 100 mL. No fibroids or other mass visualized. Endometrium Thickness: 0.3 cm.  No focal abnormality visualized. Right ovary Measurements: 2.3 x 2.9 x 2.3 cm = volume: 8 mL. Normal appearance/no adnexal mass. Left ovary Measurements: 3.9 x 2.7 x 2.9 cm = volume: 16 mL. Normal appearance/no adnexal mass. Benign functional cyst of the left ovary measuring 3.1 x 2.2 x 2.0 cm, no further follow-up or characterization is required. Pulsed Doppler evaluation of both ovaries demonstrates normal low-resistance arterial and venous waveforms. Other findings No abnormal free fluid. IMPRESSION: 1. No specific ultrasound findings of the pelvis to explain  pelvic pain and pressure. 2. Benign functional cyst of the left ovary measuring 3.1 x 2.2 x 2.0 cm, no further follow-up or characterization is required. 3. Normal arterial and venous Doppler flow to the ovaries. Electronically Signed   By: Jearld Lesch M.D.   On: 04/30/2023 11:24   US PELVIC DOPPLER (TORSION R/O OR MASS ARTERIAL FLOW)  Result Date: 04/30/2023 CLINICAL DATA:  Pelvic pain and pressure for 3 days EXAM: TRANSABDOMINAL ULTRASOUND OF PELVIS DOPPLER ULTRASOUND OF OVARIES TECHNIQUE: Transabdominal ultrasound examination of the pelvis was performed. Patient declined transvaginal examination. Transabdominal technique was performed for global imaging of the pelvis including uterus,  ovaries, adnexal regions, and pelvic cul-de-sac. Color and duplex Doppler ultrasound was utilized to evaluate blood flow to the ovaries. COMPARISON:  None Available. FINDINGS: Uterus Measurements: 8.0 x 4.2 x 5.8 cm = volume: 100 mL. No fibroids or other mass visualized. Endometrium Thickness: 0.3 cm.  No focal abnormality visualized. Right ovary Measurements: 2.3 x 2.9 x 2.3 cm = volume: 8 mL. Normal appearance/no adnexal mass. Left ovary Measurements: 3.9 x 2.7 x 2.9 cm = volume: 16 mL. Normal appearance/no adnexal mass. Benign functional cyst of the left ovary measuring 3.1 x 2.2 x 2.0 cm, no further follow-up or characterization is required. Pulsed Doppler evaluation of both ovaries demonstrates normal low-resistance arterial and venous waveforms. Other findings No abnormal free fluid. IMPRESSION: 1. No specific ultrasound findings of the pelvis to explain pelvic pain and pressure. 2. Benign functional cyst of the left ovary measuring 3.1 x 2.2 x 2.0 cm, no further follow-up or characterization is required. 3. Normal arterial and venous Doppler flow to the ovaries. Electronically Signed   By: Jearld Lesch M.D.   On: 04/30/2023 11:24    Procedures Procedures    Medications Ordered in ED Medications  acetaminophen  (TYLENOL) tablet 1,000 mg (1,000 mg Oral Given 04/30/23 1012)  ibuprofen (ADVIL) tablet 800 mg (800 mg Oral Given 04/30/23 1121)    ED Course/ Medical Decision Making/ A&P Clinical Course as of 04/30/23 1146  Tue Apr 30, 2023  1125 Wet prep unremarkable.  Patient's urinalysis clear for infection.  Pregnancy test is negative. [AL]    Clinical Course User Index [AL] Keniah Klemmer, DO                             Medical Decision Making 27 year old female presenting for evaluation of her increased pelvic pressure since having intercourse 3 days ago.  Patient is overall well-appearing with stable vitals.  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  No evidence of dyspepsia, GI bleed, symptoms consistent with nutritional complications, or obstructive abdominal pattern.  She locates her pain specifically in the low pelvis. Differential includes pregnancy, STI, UTI, genital lesion, prolapse, and others.  Patient's vitals are stable and she is afebrile.  GU exam was performed and overall unremarkable.  No obvious abnormal discharge, lesions, or prolapse noted.  Bimanual exam was unremarkable.  Swabs done for GC/chlamydia and a wet prep.  Ultrasound will be performed to evaluate for any acute abnormalities. Wet prep unremarkable and GC/chlamydia pending.  Patient does not wish to prophylactically treat since that is not a concern at this time. Urinalysis does not show evidence of a UTI.  Ultrasound of the pelvis shows no specific abnormalities.  She does have a benign 3 cm left ovarian cyst without evidence of abnormal arterial or venous Doppler flow.  No evidence of abnormal masses within the uterus or fluid within the pelvis.  Patient appears more comfortable after ibuprofen and Tylenol.  We had a long discussion about following up with her OB/GYN given the symptoms.  Patient felt comfortable returning home at this time.   Return precautions given.   Amount and/or Complexity of Data Reviewed Labs: ordered. Radiology: ordered.  Risk OTC drugs. Prescription drug management.         Final Clinical Impression(s) / ED Diagnoses Final diagnoses:  Pelvic pain in female    Rx / DC Orders ED Discharge Orders  None         Sway Guttierrez, DO 04/30/23 1146

## 2023-04-30 NOTE — Discharge Instructions (Addendum)
You were evaluated in the emergency department due to your pelvic discomfort.  Your ultrasound and other testing did not show any acute abnormalities at this time.  However, it is important that you continue to follow-up with your PCP and OB/GYN for further evaluation.  Please return to the emergency department if you develop any worsening symptoms or have any further concerns.  Continue to take ibuprofen and Tylenol to help with your pain.

## 2023-05-01 DIAGNOSIS — R509 Fever, unspecified: Secondary | ICD-10-CM | POA: Diagnosis not present

## 2023-05-01 DIAGNOSIS — M791 Myalgia, unspecified site: Secondary | ICD-10-CM | POA: Diagnosis not present

## 2023-05-01 DIAGNOSIS — G4440 Drug-induced headache, not elsewhere classified, not intractable: Secondary | ICD-10-CM | POA: Diagnosis not present

## 2023-05-01 DIAGNOSIS — R0981 Nasal congestion: Secondary | ICD-10-CM | POA: Diagnosis not present

## 2023-05-01 DIAGNOSIS — R419 Unspecified symptoms and signs involving cognitive functions and awareness: Secondary | ICD-10-CM | POA: Diagnosis not present

## 2023-05-01 DIAGNOSIS — R519 Headache, unspecified: Secondary | ICD-10-CM | POA: Diagnosis not present

## 2023-05-01 DIAGNOSIS — G43019 Migraine without aura, intractable, without status migrainosus: Secondary | ICD-10-CM | POA: Diagnosis not present

## 2023-05-01 LAB — GC/CHLAMYDIA PROBE AMP (~~LOC~~) NOT AT ARMC
Chlamydia: NEGATIVE
Comment: NEGATIVE
Comment: NORMAL
Neisseria Gonorrhea: NEGATIVE

## 2023-05-23 ENCOUNTER — Other Ambulatory Visit (HOSPITAL_COMMUNITY)
Admission: RE | Admit: 2023-05-23 | Discharge: 2023-05-23 | Disposition: A | Discharge status: Home / Self Care | Payer: Medicaid Other | Source: Ambulatory Visit | Attending: Obstetrics and Gynecology | Admitting: Obstetrics and Gynecology

## 2023-05-23 ENCOUNTER — Encounter: Payer: Self-pay | Admitting: Obstetrics and Gynecology

## 2023-05-23 ENCOUNTER — Ambulatory Visit: Payer: Medicaid Other | Admitting: Obstetrics and Gynecology

## 2023-05-23 VITALS — BP 150/80 | HR 86 | Wt 159.0 lb

## 2023-05-23 DIAGNOSIS — Z124 Encounter for screening for malignant neoplasm of cervix: Secondary | ICD-10-CM

## 2023-05-23 DIAGNOSIS — R102 Pelvic and perineal pain: Secondary | ICD-10-CM

## 2023-05-23 NOTE — Progress Notes (Signed)
Pt in office for hospital f/u due to pelvic pain. Pt states pain has been severe x months.  Pt has urinary symptoms and some bowel problems -  pressure with urination and can't hold bowels.  Pt having pain with and after intercourse. Pt had recent cycle, heavy flow and lots of pain - lasting about a week.  Pt has no form of BC.  Pt has elevated BP today, has appt with PCP in August.

## 2023-05-23 NOTE — Progress Notes (Signed)
27 yo P3 with LMP 05/19/23 and BMI 27 presenting as an ED follow up for pelvic pain. Patient seen in ED on 7/2 with a 3-day history of pelvic pain. All testing including an ultrasound was normal. Patient reports pain has been present for several years and has gotten worst. She reports a monthly 7-day period which recently became regular following the removal of her Nexplanon in March 2024. She reports significant dyspareunia and dysmenorrhea. She describes the pain as a burning pain which initiates midline in her lower pelvic and radiates towards her navel. She denies urinary symptoms or abnormal discharge. Patient expressed concerns for possibility of endometriosis  Past Medical History:  Diagnosis Date   Anemia 01/26/2022   iron transfusion 2022 resolved per pt   Anxiety    Back pain    @ times   Bacterial vaginitis    finishes flagyl 01-27-2022   Childhood asthma    Chronic post-traumatic stress disorder (PTSD)    Depression    History of COVID-19 10/2021   mild all symptoms resolved   History of heart murmur in childhood    resolved per pt   History of hypertension    none since 2021 weight loss   Migraine    occ no preventative meds taken   MRSA infection 2015   in leg   Panic attacks    Personal history of self-harm    many yrs ago due to toxic relationship per pt on 01-26-2022   Wears glasses    Past Surgical History:  Procedure Laterality Date   HALLUX VALGUS AKIN Left 02/01/2022   Procedure: AKIN BUNIONECTOMY;  Surgeon: Kieth Brightly, DPM;  Location: [facility];  Service: Podiatry;  Laterality: Left;   HALLUX VALGUS LAPIDUS Left 02/01/2022   Procedure: HALLUX VALGUS LAPIDUS;  Surgeon: Kieth Brightly, DPM;  Location: [facility];  Service: Podiatry;  Laterality: Left;  POP BLOCK   LAPAROSCOPIC GASTRIC BAND REMOVAL WITH LAPAROSCOPIC GASTRIC SLEEVE RESECTION  01/20/2020   @ [facility]   TONSILLECTOMY     as child   WISDOM TOOTH EXTRACTION      4 yrs ago   Family History  Problem Relation Age of Onset   Hypertension Father    COPD Maternal Aunt    Cancer Paternal Uncle    Social History   Tobacco Use   Smoking status: Former    Types: E-cigarettes   Smokeless tobacco: Never  Vaping Use   Vaping status: Former   Quit date: 01/22/2022  Substance Use Topics   Alcohol use: Yes    Comment: monthly or less 1-2 drinks   Drug use: Not Currently   ROS See pertinent in HPI. All other systems reviewed and non contributory Blood pressure (!) 150/80, pulse 86, weight 159 lb (72.1 kg), last menstrual period 05/18/2022. GENERAL: Well-developed, well-nourished female in no acute distress.  ABDOMEN: Soft, nontender, nondistended. No organomegaly. PELVIC: Normal external female genitalia. Vagina is pink and rugated.  Normal discharge. Normal appearing cervix. Uterus is normal in size. No adnexal mass or tenderness. Chaperone present during the pelvic exam EXTREMITIES: No cyanosis, clubbing, or edema, 2+ distal pulses.  US Pelvis Complete  Result Date: 04/30/2023 CLINICAL DATA:  Pelvic pain and pressure for 3 days EXAM: TRANSABDOMINAL ULTRASOUND OF PELVIS DOPPLER ULTRASOUND OF OVARIES TECHNIQUE: Transabdominal ultrasound examination of the pelvis was performed. Patient declined transvaginal examination. Transabdominal technique was performed for global imaging of the pelvis including uterus, ovaries, adnexal regions, and pelvic  cul-de-sac. Color and duplex Doppler ultrasound was utilized to evaluate blood flow to the ovaries. COMPARISON:  None Available. FINDINGS: Uterus Measurements: 8.0 x 4.2 x 5.8 cm = volume: 100 mL. No fibroids or other mass visualized. Endometrium Thickness: 0.3 cm.  No focal abnormality visualized. Right ovary Measurements: 2.3 x 2.9 x 2.3 cm = volume: 8 mL. Normal appearance/no adnexal mass. Left ovary Measurements: 3.9 x 2.7 x 2.9 cm = volume: 16 mL. Normal appearance/no adnexal mass. Benign functional cyst of the  left ovary measuring 3.1 x 2.2 x 2.0 cm, no further follow-up or characterization is required. Pulsed Doppler evaluation of both ovaries demonstrates normal low-resistance arterial and venous waveforms. Other findings No abnormal free fluid. IMPRESSION: 1. No specific ultrasound findings of the pelvis to explain pelvic pain and pressure. 2. Benign functional cyst of the left ovary measuring 3.1 x 2.2 x 2.0 cm, no further follow-up or characterization is required. 3. Normal arterial and venous Doppler flow to the ovaries. Electronically Signed   By: Jearld Lesch M.D.   On: 04/30/2023 11:24   US PELVIC DOPPLER (TORSION R/O OR MASS ARTERIAL FLOW)  Result Date: 04/30/2023 CLINICAL DATA:  Pelvic pain and pressure for 3 days EXAM: TRANSABDOMINAL ULTRASOUND OF PELVIS DOPPLER ULTRASOUND OF OVARIES TECHNIQUE: Transabdominal ultrasound examination of the pelvis was performed. Patient declined transvaginal examination. Transabdominal technique was performed for global imaging of the pelvis including uterus, ovaries, adnexal regions, and pelvic cul-de-sac. Color and duplex Doppler ultrasound was utilized to evaluate blood flow to the ovaries. COMPARISON:  None Available. FINDINGS: Uterus Measurements: 8.0 x 4.2 x 5.8 cm = volume: 100 mL. No fibroids or other mass visualized. Endometrium Thickness: 0.3 cm.  No focal abnormality visualized. Right ovary Measurements: 2.3 x 2.9 x 2.3 cm = volume: 8 mL. Normal appearance/no adnexal mass. Left ovary Measurements: 3.9 x 2.7 x 2.9 cm = volume: 16 mL. Normal appearance/no adnexal mass. Benign functional cyst of the left ovary measuring 3.1 x 2.2 x 2.0 cm, no further follow-up or characterization is required. Pulsed Doppler evaluation of both ovaries demonstrates normal low-resistance arterial and venous waveforms. Other findings No abnormal free fluid. IMPRESSION: 1. No specific ultrasound findings of the pelvis to explain pelvic pain and pressure. 2. Benign functional cyst of the  left ovary measuring 3.1 x 2.2 x 2.0 cm, no further follow-up or characterization is required. 3. Normal arterial and venous Doppler flow to the ovaries. Electronically Signed   By: Jearld Lesch M.D.   On: 04/30/2023 11:24     A/P 27 yo with chronic pelvic pain - Discussed possibility of endometriosis. Discussed laparoscopic biopsies as definitive diagnosis or trial of medication including contraception for management of symptoms - Patient is not interested in medical management at this time and is considering laparoscopy - Patient will be referred to [facility] for further evaluation and management - Pap smear collected - Patient will be contacted with abnormal results

## 2023-05-24 DIAGNOSIS — R1084 Generalized abdominal pain: Secondary | ICD-10-CM | POA: Diagnosis not present

## 2023-05-24 DIAGNOSIS — Z903 Acquired absence of stomach [part of]: Secondary | ICD-10-CM | POA: Diagnosis not present

## 2023-05-24 DIAGNOSIS — E611 Iron deficiency: Secondary | ICD-10-CM | POA: Diagnosis not present

## 2023-05-28 DIAGNOSIS — K912 Postsurgical malabsorption, not elsewhere classified: Secondary | ICD-10-CM | POA: Diagnosis not present

## 2023-05-28 DIAGNOSIS — R1013 Epigastric pain: Secondary | ICD-10-CM | POA: Diagnosis not present

## 2023-05-28 DIAGNOSIS — Z903 Acquired absence of stomach [part of]: Secondary | ICD-10-CM | POA: Diagnosis not present

## 2023-06-04 DIAGNOSIS — R1084 Generalized abdominal pain: Secondary | ICD-10-CM | POA: Diagnosis not present

## 2023-06-04 DIAGNOSIS — Z9884 Bariatric surgery status: Secondary | ICD-10-CM | POA: Diagnosis not present

## 2023-06-12 DIAGNOSIS — R140 Abdominal distension (gaseous): Secondary | ICD-10-CM | POA: Diagnosis not present

## 2023-06-12 DIAGNOSIS — M545 Low back pain, unspecified: Secondary | ICD-10-CM | POA: Diagnosis not present

## 2023-06-12 DIAGNOSIS — R5383 Other fatigue: Secondary | ICD-10-CM | POA: Diagnosis not present

## 2023-06-12 DIAGNOSIS — R251 Tremor, unspecified: Secondary | ICD-10-CM | POA: Diagnosis not present

## 2023-06-12 DIAGNOSIS — R519 Headache, unspecified: Secondary | ICD-10-CM | POA: Diagnosis not present

## 2023-06-12 DIAGNOSIS — R0789 Other chest pain: Secondary | ICD-10-CM | POA: Diagnosis not present

## 2023-06-12 DIAGNOSIS — M79605 Pain in left leg: Secondary | ICD-10-CM | POA: Diagnosis not present

## 2023-06-12 DIAGNOSIS — R222 Localized swelling, mass and lump, trunk: Secondary | ICD-10-CM | POA: Diagnosis not present

## 2023-06-12 DIAGNOSIS — R4189 Other symptoms and signs involving cognitive functions and awareness: Secondary | ICD-10-CM | POA: Diagnosis not present

## 2023-06-12 DIAGNOSIS — M79604 Pain in right leg: Secondary | ICD-10-CM | POA: Diagnosis not present

## 2023-06-12 DIAGNOSIS — H43393 Other vitreous opacities, bilateral: Secondary | ICD-10-CM | POA: Diagnosis not present

## 2023-06-13 DIAGNOSIS — M47812 Spondylosis without myelopathy or radiculopathy, cervical region: Secondary | ICD-10-CM | POA: Diagnosis not present

## 2023-06-13 DIAGNOSIS — R202 Paresthesia of skin: Secondary | ICD-10-CM | POA: Diagnosis not present

## 2023-06-13 DIAGNOSIS — R079 Chest pain, unspecified: Secondary | ICD-10-CM | POA: Diagnosis not present

## 2023-06-13 DIAGNOSIS — M533 Sacrococcygeal disorders, not elsewhere classified: Secondary | ICD-10-CM | POA: Diagnosis not present

## 2023-06-21 DIAGNOSIS — F411 Generalized anxiety disorder: Secondary | ICD-10-CM | POA: Diagnosis not present

## 2023-06-21 DIAGNOSIS — Z87891 Personal history of nicotine dependence: Secondary | ICD-10-CM | POA: Diagnosis not present

## 2023-06-21 DIAGNOSIS — R002 Palpitations: Secondary | ICD-10-CM | POA: Diagnosis not present

## 2023-06-21 DIAGNOSIS — I10 Essential (primary) hypertension: Secondary | ICD-10-CM | POA: Diagnosis not present

## 2023-06-21 DIAGNOSIS — E785 Hyperlipidemia, unspecified: Secondary | ICD-10-CM | POA: Diagnosis not present

## 2023-06-21 DIAGNOSIS — R079 Chest pain, unspecified: Secondary | ICD-10-CM | POA: Diagnosis not present

## 2023-06-26 DIAGNOSIS — M47812 Spondylosis without myelopathy or radiculopathy, cervical region: Secondary | ICD-10-CM | POA: Diagnosis not present

## 2023-06-26 DIAGNOSIS — R222 Localized swelling, mass and lump, trunk: Secondary | ICD-10-CM | POA: Diagnosis not present

## 2023-07-02 DIAGNOSIS — R002 Palpitations: Secondary | ICD-10-CM | POA: Diagnosis not present

## 2023-07-12 DIAGNOSIS — K63829 Intestinal methanogen overgrowth, unspecified: Secondary | ICD-10-CM | POA: Diagnosis not present

## 2023-07-12 DIAGNOSIS — K638219 Small intestinal bacterial overgrowth, unspecified: Secondary | ICD-10-CM | POA: Diagnosis not present

## 2023-07-16 DIAGNOSIS — K638219 Small intestinal bacterial overgrowth, unspecified: Secondary | ICD-10-CM | POA: Diagnosis not present

## 2023-07-18 ENCOUNTER — Ambulatory Visit: Payer: Medicaid Other | Admitting: Obstetrics and Gynecology

## 2023-07-18 ENCOUNTER — Other Ambulatory Visit: Payer: Self-pay

## 2023-07-18 VITALS — BP 126/90 | HR 70 | Wt 161.0 lb

## 2023-07-18 DIAGNOSIS — N939 Abnormal uterine and vaginal bleeding, unspecified: Secondary | ICD-10-CM

## 2023-07-18 DIAGNOSIS — G8929 Other chronic pain: Secondary | ICD-10-CM | POA: Diagnosis not present

## 2023-07-18 DIAGNOSIS — R102 Pelvic and perineal pain unspecified side: Secondary | ICD-10-CM

## 2023-07-18 DIAGNOSIS — N941 Unspecified dyspareunia: Secondary | ICD-10-CM

## 2023-07-18 MED ORDER — ETONOGESTREL-ETHINYL ESTRADIOL 0.12-0.015 MG/24HR VA RING: VAGINAL_RING | VAGINAL | 12 refills | Status: DC

## 2023-07-18 NOTE — Progress Notes (Signed)
GYNECOLOGY VISIT  Patient name: Tara Blake MRN [id number]  Date of birth: 1996/08/31 Chief Complaint:   Gynecologic Exam   History:  Tara Blake is a 27 y.o. G3P3003 being seen today for pelvic pain.  Has always had bad periods and cramping but has had this other pain for about 3 years. The pain feels like a lot of pressure and feels she has to slowly sit down like a pressure bubble. This is the same pain she feels with intercourse. Has had changes in BM - going to see a GI doc. Menses are currently regular.  Nexplaon in the past, only form of contraception used previously  Previously would take tylenol and ibuprofen for pain Had stomach surgery 3 years ago, no complications - no more ibuprofen afterwards Not sure but fairly certain she has completed childbearing 3 vaginal deliveries Had a 'spinal tap' after epidural  - had a blood patch due to head concerns after epidural  Will feel pressure when going to lift her child; feels ok right now while sitting and has spotting in between periods and blood in the urine; will have bad bloating as well. Went GI doctor - diagnosed with SIBO  Pain with Intercourse: not with entry, but will have pain with deep and shallow penetration and will have cramps for a few days after  Occasional burning with urination  Prior diagnosis of BV; no STIs previously  When on cycle: have always been heavy. Since being off nexplanon they are shorter.   Has not previosly done PFPT and feels like he is hitting something during intercourse     'Pelvic pain x 3 years and went to ED due to it being "unbearable" and [place] was unremarkable. She was consulted for possible endometriosis. Menses are 7d, first 3d are heavy and changig 7x. Will also have spotting in between her periods. B/n cycles will have pelvic cramping. Pain with intercourse, not currently on birth control. Nexplanon removed in May.'  Past Medical History:  Diagnosis Date   Anemia 01/26/2022   iron  transfusion 2022 resolved per pt   Anxiety    Back pain    @ times   Bacterial vaginitis    finishes flagyl 01-27-2022   Childhood asthma    Chronic post-traumatic stress disorder (PTSD)    Depression    History of COVID-19 10/2021   mild all symptoms resolved   History of heart murmur in childhood    resolved per pt   History of hypertension    none since 2021 weight loss   Migraine    occ no preventative meds taken   MRSA infection 2015   in leg   Panic attacks    Personal history of self-harm    many yrs ago due to toxic relationship per pt on 01-26-2022   Wears glasses     Past Surgical History:  Procedure Laterality Date   HALLUX VALGUS AKIN Left 02/01/2022   Procedure: AKIN BUNIONECTOMY;  Surgeon: Kieth Brightly, DPM;  Location: [facility];  Service: Podiatry;  Laterality: Left;   HALLUX VALGUS LAPIDUS Left 02/01/2022   Procedure: HALLUX VALGUS LAPIDUS;  Surgeon: Kieth Brightly, DPM;  Location: [facility];  Service: Podiatry;  Laterality: Left;  POP BLOCK   LAPAROSCOPIC GASTRIC BAND REMOVAL WITH LAPAROSCOPIC GASTRIC SLEEVE RESECTION  01/20/2020   @ [facility]   TONSILLECTOMY     as child   WISDOM TOOTH EXTRACTION     4 yrs ago  The following portions of the patient's history were reviewed and updated as appropriate: allergies, current medications, past family history, past medical history, past social history, past surgical history and problem list.   Health Maintenance:   Last pap     Component Value Date/Time   DIAGPAP  05/23/2023 1214    - Negative for intraepithelial lesion or malignancy (NILM)   DIAGPAP (A) 08/28/2021 1558    - Atypical squamous cells of undetermined significance (ASC-US)   HPVHIGH Negative 08/28/2021 1558   ADEQPAP  05/23/2023 1214    Satisfactory for evaluation; transformation zone component PRESENT.   ADEQPAP  08/28/2021 1558    Satisfactory for evaluation; transformation zone component PRESENT.     High Risk HPV: Positive  Adequacy:  Satisfactory for evaluation, transformation zone component PRESENT  Diagnosis:  Atypical squamous cells of undetermined significance (ASC-US)  Last mammogram: n/a   Review of Systems:  Pertinent items are noted in HPI. Comprehensive review of systems was otherwise negative.   Objective:  Physical Exam BP (!) 126/95   Pulse 68   Wt 161 lb (73 kg)   LMP 07/13/2023 Comment: periods normally last 7 days  BMI 27.64 kg/m    Physical Exam Vitals and nursing note reviewed. Exam conducted with a chaperone present.  Constitutional:      Appearance: Normal appearance.  HENT:     Head: Normocephalic and atraumatic.  Pulmonary:     Effort: Pulmonary effort is normal.     Breath sounds: Normal breath sounds.  Genitourinary:    General: Normal vulva.     Exam position: Lithotomy position.     Vagina: Normal.     Cervix: Normal.     Comments: Normal appearing vulva Normal vulvar sensation bilaterally Tender superficial pelvic floor muscles Nontender ischial tuberosities bilaterally  Allodynia at introitus: No  Anal wink present Posterior vaginal wall tender Right levator ani 5/10 Right ischiococcygeous 5/10 Right obturator internus 6/10 Left levator ani 5/10 Left ischioccocygeous 7/10 Left obturator internus 9/10 Mild adnexal tenderness   Skin:    General: Skin is warm and dry.  Neurological:     General: No focal deficit present.     Mental Status: She is alert.  Psychiatric:        Mood and Affect: Mood normal.        Behavior: Behavior normal.        Thought Content: Thought content normal.        Judgment: Judgment normal.         Assessment & Plan:   1. Chronic pelvic pain in female Noted to have pelvic floor myalgia on exam  and mild adnexal tenderness. Discussed factor of MSK and may have element of endometriosis given adnexal tenderness. Reviewed that MSK pain treated with PFPT and can add on medications. Presumed  endometriosis treated with menstrual suppression and if unable to adequately suppress with medication, or desires definitive diagnosis, proceed with surgical intervention.  - etonogestrel-ethinyl estradiol (NUVARING) 0.12-0.015 MG/24HR vaginal ring; Insert vaginally and leave in place for 4 consecutive weeks  Dispense: 1 each; Refill: 12 - Ambulatory referral to Physical Therapy  2. Abnormal uterine bleeding (AUB) Discussed use of CHC for menstrual suppression. After review of options, and given hx of gastric sleeve, will trial continuous vaginal ring - etonogestrel-ethinyl estradiol (NUVARING) 0.12-0.015 MG/24HR vaginal ring; Insert vaginally and leave in place for 4 consecutive weeks  Dispense: 1 each; Refill: 12  3. Dyspareunia, female PFP referral for dyspareunia secondary to pelvic myalgia.  -  Ambulatory referral to Physical Therapy   Routine preventative health maintenance measures emphasized.  Lorriane Shire, MD Minimally Invasive Gynecologic Surgery [facility], [facility]

## 2023-07-18 NOTE — Patient Instructions (Addendum)
Use ring for 4 weeks at a time and switch the ring to change  Continue takign the muscle relaxer  I have a placed a referral to pelvic physical therapy

## 2023-07-22 DIAGNOSIS — R002 Palpitations: Secondary | ICD-10-CM | POA: Diagnosis not present

## 2023-07-31 DIAGNOSIS — M7918 Myalgia, other site: Secondary | ICD-10-CM | POA: Diagnosis not present

## 2023-07-31 DIAGNOSIS — G894 Chronic pain syndrome: Secondary | ICD-10-CM | POA: Diagnosis not present

## 2023-07-31 DIAGNOSIS — M797 Fibromyalgia: Secondary | ICD-10-CM | POA: Diagnosis not present

## 2023-08-09 ENCOUNTER — Encounter: Payer: Self-pay | Admitting: Obstetrics and Gynecology

## 2023-08-12 ENCOUNTER — Other Ambulatory Visit: Payer: Self-pay | Admitting: Obstetrics and Gynecology

## 2023-08-12 ENCOUNTER — Encounter: Payer: Self-pay | Admitting: *Deleted

## 2023-08-12 DIAGNOSIS — N939 Abnormal uterine and vaginal bleeding, unspecified: Secondary | ICD-10-CM

## 2023-08-12 MED ORDER — TRANEXAMIC ACID 650 MG PO TABS
1300.0000 mg | ORAL_TABLET | Freq: Three times a day (TID) | ORAL | 2 refills | Status: DC
Start: 2023-08-12 — End: 2024-02-26

## 2023-08-21 DIAGNOSIS — R222 Localized swelling, mass and lump, trunk: Secondary | ICD-10-CM | POA: Diagnosis not present

## 2023-08-21 DIAGNOSIS — Z903 Acquired absence of stomach [part of]: Secondary | ICD-10-CM | POA: Diagnosis not present
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
## 2023-08-26 ENCOUNTER — Encounter: Payer: Self-pay | Admitting: Physical Therapy

## 2023-08-26 ENCOUNTER — Ambulatory Visit: Payer: Medicaid Other | Attending: Obstetrics and Gynecology | Admitting: Physical Therapy

## 2023-08-26 ENCOUNTER — Other Ambulatory Visit: Payer: Self-pay

## 2023-08-26 DIAGNOSIS — G8929 Other chronic pain: Secondary | ICD-10-CM | POA: Insufficient documentation

## 2023-08-26 DIAGNOSIS — R278 Other lack of coordination: Secondary | ICD-10-CM | POA: Diagnosis not present

## 2023-08-26 DIAGNOSIS — R252 Cramp and spasm: Secondary | ICD-10-CM | POA: Diagnosis not present

## 2023-08-26 DIAGNOSIS — M6281 Muscle weakness (generalized): Secondary | ICD-10-CM | POA: Diagnosis not present

## 2023-08-26 DIAGNOSIS — R293 Abnormal posture: Secondary | ICD-10-CM | POA: Diagnosis not present

## 2023-08-26 DIAGNOSIS — R102 Pelvic and perineal pain: Secondary | ICD-10-CM | POA: Insufficient documentation

## 2023-08-26 DIAGNOSIS — N941 Unspecified dyspareunia: Secondary | ICD-10-CM | POA: Diagnosis not present

## 2023-09-10 ENCOUNTER — Ambulatory Visit: Payer: Medicaid Other | Admitting: Physical Therapy

## 2023-09-11 DIAGNOSIS — R222 Localized swelling, mass and lump, trunk: Secondary | ICD-10-CM | POA: Diagnosis not present

## 2023-09-11 DIAGNOSIS — I10 Essential (primary) hypertension: Secondary | ICD-10-CM | POA: Diagnosis not present

## 2023-09-11 DIAGNOSIS — M7989 Other specified soft tissue disorders: Secondary | ICD-10-CM | POA: Diagnosis not present

## 2023-09-15 DIAGNOSIS — R222 Localized swelling, mass and lump, trunk: Secondary | ICD-10-CM | POA: Diagnosis not present

## 2023-09-17 ENCOUNTER — Encounter: Payer: Medicaid Other | Admitting: Physical Therapy

## 2023-09-24 ENCOUNTER — Encounter: Payer: Medicaid Other | Admitting: Physical Therapy

## 2023-10-04 DIAGNOSIS — E611 Iron deficiency: Secondary | ICD-10-CM | POA: Diagnosis not present

## 2023-10-16 DIAGNOSIS — R202 Paresthesia of skin: Secondary | ICD-10-CM | POA: Diagnosis not present

## 2023-10-26 ENCOUNTER — Encounter (HOSPITAL_COMMUNITY): Payer: Self-pay

## 2023-10-26 ENCOUNTER — Emergency Department (HOSPITAL_COMMUNITY)
Admission: EM | Admit: 2023-10-26 | Discharge: 2023-10-27 | Disposition: A | Discharge status: Home / Self Care | Payer: Medicaid Other | Attending: Emergency Medicine | Admitting: Emergency Medicine

## 2023-10-26 ENCOUNTER — Other Ambulatory Visit: Payer: Self-pay

## 2023-10-26 ENCOUNTER — Emergency Department (HOSPITAL_COMMUNITY): Payer: Medicaid Other

## 2023-10-26 DIAGNOSIS — G44209 Tension-type headache, unspecified, not intractable: Secondary | ICD-10-CM | POA: Diagnosis not present

## 2023-10-26 DIAGNOSIS — R519 Headache, unspecified: Secondary | ICD-10-CM | POA: Diagnosis not present

## 2023-10-26 LAB — CBC WITH DIFFERENTIAL/PLATELET
Abs Immature Granulocytes: 0.01 10*3/uL (ref 0.00–0.07)
Basophils Absolute: 0.1 10*3/uL (ref 0.0–0.1)
Basophils Relative: 1 %
Eosinophils Absolute: 0.2 10*3/uL (ref 0.0–0.5)
Eosinophils Relative: 4 %
HCT: 36.5 % (ref 36.0–46.0)
Hemoglobin: 12.3 g/dL (ref 12.0–15.0)
Immature Granulocytes: 0 %
Lymphocytes Relative: 39 %
Lymphs Abs: 2 10*3/uL (ref 0.7–4.0)
MCH: 30.2 pg (ref 26.0–34.0)
MCHC: 33.7 g/dL (ref 30.0–36.0)
MCV: 89.7 fL (ref 80.0–100.0)
Monocytes Absolute: 0.5 10*3/uL (ref 0.1–1.0)
Monocytes Relative: 10 %
Neutro Abs: 2.4 10*3/uL (ref 1.7–7.7)
Neutrophils Relative %: 46 %
Platelets: 252 10*3/uL (ref 150–400)
RBC: 4.07 MIL/uL (ref 3.87–5.11)
RDW: 12.2 % (ref 11.5–15.5)
WBC: 5.2 10*3/uL (ref 4.0–10.5)
nRBC: 0 % (ref 0.0–0.2)

## 2023-10-26 LAB — BASIC METABOLIC PANEL
Anion gap: 9 (ref 5–15)
BUN: 9 mg/dL (ref 6–20)
CO2: 23 mmol/L (ref 22–32)
Calcium: 8.9 mg/dL (ref 8.9–10.3)
Chloride: 103 mmol/L (ref 98–111)
Creatinine, Ser: 0.76 mg/dL (ref 0.44–1.00)
GFR, Estimated: >60 mL/min (ref >60)
Glucose, Bld: 91 mg/dL (ref 70–99)
Potassium: 3.6 mmol/L (ref 3.5–5.1)
Sodium: 135 mmol/L (ref 135–145)

## 2023-10-26 NOTE — ED Provider Triage Note (Signed)
Emergency Medicine Provider Triage Evaluation Note  Tara Blake , a 27 y.o. female  was evaluated in triage.  Pt complains of headache.  Review of Systems  Positive:  Negative:   Physical Exam  BP (!) 130/99 (BP Location: Right Arm)   Pulse 81   Temp (!) 97.5 F (36.4 C)   Resp 18   Ht 5\' 4"  (1.626 m)   Wt 73 kg   LMP 09/29/2023 (Approximate)   SpO2 100%   BMI 27.64 kg/m  Gen:   Awake, no distress   Resp:  Normal effort  MSK:   Moves extremities without difficulty  Other:    Medical Decision Making  Medically screening exam initiated at 8:40 PM.  Appropriate orders placed.  Tara Blake was informed that the remainder of the evaluation will be completed by another provider, this initial triage assessment does not replace that evaluation, and the importance of remaining in the ED until their evaluation is complete.  Patient stating that she had had daily headaches over the past year. Present in the morning but increases in severity over the day. Patient stating that she feels lumps on her head. States that when she presses on a lump on the back of her head, it makes her brain hurt behind her eyeballs. I am not seeing a lump.Marland KitchenMarland KitchenStates that headaches have been worse over the past month. Patient very anxious. Denies head trauma, LOC, seizures, blood thinners.   [name], [place] 10/26/23 2044

## 2023-10-26 NOTE — ED Triage Notes (Signed)
Pt reports headache, neck pain and dizzy spells, onset all year and has progressively worsened over the last month. This last month she started having pressure and noticed "bumps" on her head. She also reports bilateral leg pain.

## 2023-10-27 MED ORDER — IBUPROFEN 800 MG PO TABS
800.0000 mg | ORAL_TABLET | Freq: Once | ORAL | Status: AC
  Administered 2023-10-27: 800 mg via ORAL
  Filled 2023-10-27: qty 1

## 2023-10-27 MED ORDER — DROPERIDOL 2.5 MG/ML IJ SOLN
1.2500 mg | Freq: Once | INTRAMUSCULAR | Status: AC
  Administered 2023-10-27: 1.25 mg via INTRAVENOUS
  Filled 2023-10-27: qty 2

## 2023-10-27 MED ORDER — DIPHENHYDRAMINE HCL 50 MG/ML IJ SOLN
12.5000 mg | Freq: Once | INTRAMUSCULAR | Status: AC
  Administered 2023-10-27: 12.5 mg via INTRAVENOUS
  Filled 2023-10-27: qty 1

## 2023-10-27 NOTE — ED Provider Notes (Signed)
[place] EMERGENCY DEPARTMENT AT [facility] Provider Note   CSN: [id number] Arrival date & time: 10/26/23  1833     History  Chief Complaint  Patient presents with   Headache    Tara Blake is a 27 y.o. female.  The history is provided by the patient.  Headache Tara Blake is a 27 y.o. female who presents to the Emergency Department complaining of headache.  She presents to the emergency department for evaluation of 1 year of symptoms of posterior neck pain that has gradually increased to an occipital and left-sided headache.  Headache has been constant for 1 month and initially was waxing and waning.  Headache radiates to the left front of her face.  She reports spots in her vision since February.  She also reports tingling in bilateral arms and bilateral legs for the majority of the year.  She also reports feeling lumps on her head and gets lightheaded and dizzy at times.  She had a temp to 99 earlier in the month. She has seen her PCP and has a referral in place to a headache specialist but has not been seen yet. Takes baclofen, dicyclomine.  No hormones. No chance of pregnancy.       Home Medications Prior to Admission medications   Medication Sig Start Date End Date Taking? Authorizing Provider  acetaminophen (TYLENOL) 325 MG tablet Take 650 mg by mouth every 6 (six) hours as needed for moderate pain or headache.    [provider]  baclofen (LIORESAL) 10 MG tablet Take 10 mg by mouth 3 (three) times daily.    [provider]  dicyclomine (BENTYL) 20 MG tablet Take 1 tablet (20 mg total) by mouth 2 (two) times daily. 12/13/22   Theron Arista, PA-C  Multiple Vitamin (MULTIVITAMIN) tablet Take 1 tablet by mouth daily.    [provider]  tranexamic acid (LYSTEDA) 650 MG TABS tablet Take 2 tablets (1,300 mg total) by mouth 3 (three) times daily. Take during menses for a maximum of five days 08/12/23   Lorriane Shire, MD  VENTOLIN  HFA 108 (90 Base) MCG/ACT inhaler Inhale 1-2 puffs into the lungs. For panic attacks 09/13/21   [provider]      Allergies    Amoxicillin    Review of Systems   Review of Systems  Neurological:  Positive for headaches.  All other systems reviewed and are negative.   Physical Exam Updated Vital Signs BP (!) 129/91   Pulse 84   Temp 98.6 F (37 C) (Oral)   Resp 16   Ht 5\' 4"  (1.626 m)   Wt 73 kg   LMP 09/29/2023 (Approximate)   SpO2 100%   BMI 27.64 kg/m  Physical Exam Vitals and nursing note reviewed.  Constitutional:      Appearance: She is well-developed.  HENT:     Head: Normocephalic and atraumatic.  Eyes:     Extraocular Movements: Extraocular movements intact.     Pupils: Pupils are equal, round, and reactive to light.  Cardiovascular:     Rate and Rhythm: Normal rate and regular rhythm.     Heart sounds: No murmur heard. Pulmonary:     Effort: Pulmonary effort is normal. No respiratory distress.     Breath sounds: Normal breath sounds.  Abdominal:     Palpations: Abdomen is soft.     Tenderness: There is no abdominal tenderness. There is no guarding or rebound.  Musculoskeletal:  General: No tenderness.  Skin:    General: Skin is warm and dry.  Neurological:     Mental Status: She is alert and oriented to person, place, and time.     Comments: No asymmetry of facial movements 5/5 strength in all four extremities with sensation to light touch intact in all four extremities.  Visual fields grossly intact.   Psychiatric:        Behavior: Behavior normal.     ED Results / Procedures / Treatments   Labs (all labs ordered are listed, but only abnormal results are displayed) Labs Reviewed  CBC WITH DIFFERENTIAL/PLATELET  BASIC METABOLIC PANEL    EKG None  Radiology CT Head Wo Contrast Result Date: 10/26/2023 CLINICAL DATA:  Headache, increasing frequency or severity EXAM: CT HEAD WITHOUT CONTRAST TECHNIQUE: Contiguous axial images  were obtained from the base of the skull through the vertex without intravenous contrast. RADIATION DOSE REDUCTION: This exam was performed according to the departmental dose-optimization program which includes automated exposure control, adjustment of the mA and/or kV according to patient size and/or use of iterative reconstruction technique. COMPARISON:  Head CT 01/09/2018 FINDINGS: Brain: No intracranial hemorrhage, mass effect, or midline shift. No hydrocephalus. The basilar cisterns are patent. No evidence of territorial infarct or acute ischemia. No extra-axial or intracranial fluid collection. Vascular: No hyperdense vessel or unexpected calcification. Skull: Normal. Negative for fracture or focal lesion. Sinuses/Orbits: Mucous retention cysts in the right maxillary sinus. No acute findings. Other: None IMPRESSION: Normal head CT. Electronically Signed   By: Narda Rutherford M.D.   On: 10/26/2023 21:25    Procedures Procedures    Medications Ordered in ED Medications  ibuprofen (ADVIL) tablet 800 mg (800 mg Oral Given 10/27/23 0041)  droperidol (INAPSINE) 2.5 MG/ML injection 1.25 mg (1.25 mg Intravenous Given 10/27/23 0351)  diphenhydrAMINE (BENADRYL) injection 12.5 mg (12.5 mg Intravenous Given 10/27/23 0350)    ED Course/ Medical Decision Making/ A&P                                 Medical Decision Making Risk Prescription drug management.   Patient here for evaluation of progressive headache over the last year.  She is nontoxic-appearing on evaluation with no focal neurologic deficits.  She does have the sensation of lumps on her head, not palpable on examination or visualized on CT head.  Labs are reassuring.  She was treated with medications for her headache with resolution of her pain.  Presentation is not consistent with meningitis, subarachnoid hemorrhage, dural sinus thrombosis.  She does have elevated diastolic blood pressures in the emergency department, no history of  hypertension.  Discussed that she will need to follow-up with her PCP for recheck.        Final Clinical Impression(s) / ED Diagnoses Final diagnoses:  Tension headache    Rx / DC Orders ED Discharge Orders     None         Tilden Fossa, MD 10/27/23 [id number]

## 2023-10-27 NOTE — ED Notes (Signed)
This RN reviewed discharge instructions with patient. She verbalized understanding and denied any further questions. PT well appearing upon discharge and reports tolerable pain. Pt ambulated with stable gait to exit. Pt endorses ride home.  

## 2023-10-30 NOTE — L&D Delivery Note (Addendum)
 OB/GYN Faculty Practice Delivery Note  Tara Blake is a 28 y.o. [phone] s/p SROM at [redacted]w[redacted]d by LMP. She was admitted for SROM.   ROM: 21h 75m with clear fluid GBS Status: negative Maximum Maternal Temperature: 98.4  Labor Progress: Progressed to complete after AROM of forebag.  Delivery Date/Time: 09/10/2024 @ 1552 Delivery: Called to room and patient was complete and pushing. Head delivered LOA. No nuchal cord present. Shoulder and body delivered in usual fashion. Infant with spontaneous cry, placed on mother's abdomen, dried and stimulated. Cord clamped x 2 after 1-minute delay, and cut by FOB. IV pitocin  started. Cord blood drawn. Placenta delivered spontaneously, intact, with 3-vessel cord. Moderate bleeding noted and TXA/Methergine given. Fundus firm and minimal bleeding noted at this time.  Labia, perineum, vagina, and cervix inspected, right periurethral laceration identified with excellent hemostasis and approximation noted.   Placenta: Intact, delivered spontaneously. Complications: none Lacerations: Right periurethral laceration identified. Excellent hemostasis and approximation noted QBL: 382 ml Analgesia: Epidural GBS: unknown, Ancef  prophylaxis  Infant: Viable female  APGARs 9/9  weight pending  Tara Blake, SNM, RNC-OB Student Nurse Midwife 09/10/2024 4:50 PM   I was present for the entire delivery of baby and placenta and inspection of perineum and agree with above.  Tara Blake, Tara Blake , CNM 09/10/2024 4:57 PM

## 2023-10-31 ENCOUNTER — Ambulatory Visit: Payer: Medicaid Other | Attending: Obstetrics and Gynecology | Admitting: Physical Therapy

## 2023-10-31 ENCOUNTER — Encounter: Payer: Self-pay | Admitting: Physical Therapy

## 2023-10-31 DIAGNOSIS — R278 Other lack of coordination: Secondary | ICD-10-CM | POA: Diagnosis not present

## 2023-10-31 DIAGNOSIS — R293 Abnormal posture: Secondary | ICD-10-CM | POA: Diagnosis not present

## 2023-10-31 DIAGNOSIS — M6281 Muscle weakness (generalized): Secondary | ICD-10-CM | POA: Insufficient documentation

## 2023-10-31 DIAGNOSIS — R252 Cramp and spasm: Secondary | ICD-10-CM | POA: Insufficient documentation

## 2023-10-31 DIAGNOSIS — R102 Pelvic and perineal pain: Secondary | ICD-10-CM | POA: Insufficient documentation
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
## 2023-11-07 ENCOUNTER — Encounter: Payer: Self-pay | Admitting: Physical Therapy

## 2023-11-07 ENCOUNTER — Ambulatory Visit: Payer: Medicaid Other | Admitting: Physical Therapy

## 2023-11-07 DIAGNOSIS — M6281 Muscle weakness (generalized): Secondary | ICD-10-CM | POA: Diagnosis not present

## 2023-11-07 DIAGNOSIS — R102 Pelvic and perineal pain: Secondary | ICD-10-CM | POA: Diagnosis not present

## 2023-11-07 DIAGNOSIS — R252 Cramp and spasm: Secondary | ICD-10-CM | POA: Diagnosis not present

## 2023-11-07 DIAGNOSIS — R293 Abnormal posture: Secondary | ICD-10-CM | POA: Diagnosis not present

## 2023-11-07 DIAGNOSIS — R278 Other lack of coordination: Secondary | ICD-10-CM
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
## 2023-11-14 ENCOUNTER — Ambulatory Visit: Payer: Medicaid Other | Admitting: Physical Therapy

## 2023-11-21 ENCOUNTER — Encounter: Payer: Medicaid Other | Admitting: Physical Therapy

## 2023-11-26 DIAGNOSIS — K580 Irritable bowel syndrome with diarrhea: Secondary | ICD-10-CM | POA: Insufficient documentation

## 2023-11-26 DIAGNOSIS — K638219 Small intestinal bacterial overgrowth, unspecified: Secondary | ICD-10-CM | POA: Diagnosis not present

## 2023-11-28 ENCOUNTER — Encounter: Payer: Medicaid Other | Admitting: Physical Therapy

## 2023-12-10 DIAGNOSIS — M26601 Right temporomandibular joint disorder, unspecified: Secondary | ICD-10-CM | POA: Diagnosis not present

## 2023-12-10 DIAGNOSIS — J309 Allergic rhinitis, unspecified: Secondary | ICD-10-CM | POA: Diagnosis not present

## 2023-12-10 DIAGNOSIS — J341 Cyst and mucocele of nose and nasal sinus: Secondary | ICD-10-CM | POA: Diagnosis not present

## 2024-01-27 ENCOUNTER — Ambulatory Visit

## 2024-01-27 VITALS — BP 120/87 | HR 76

## 2024-01-27 DIAGNOSIS — Z3201 Encounter for pregnancy test, result positive: Secondary | ICD-10-CM

## 2024-01-27 LAB — POCT URINE PREGNANCY: Preg Test, Ur: POSITIVE — AB

## 2024-01-27 MED ORDER — PROMETHAZINE HCL 25 MG PO TABS: 25.0000 mg | ORAL_TABLET | Freq: Four times a day (QID) | ORAL | 1 refills | Status: DC | PRN

## 2024-01-27 MED ORDER — PRENATAL 28-0.8 MG PO TABS
1.0000 | ORAL_TABLET | Freq: Every day | ORAL | 12 refills | Status: AC
Start: 2024-01-27 — End: ?

## 2024-01-27 NOTE — Progress Notes (Signed)
 Mady Haagensen here for a UPT. Pt had a positive upt at home. LMP is 12/28/23.     UPT in office Positive.    Reviewed medications and informed to start a PNV, if not already. Pt to follow up in 4 weeks for New OB Intake. Prenatal care at [facility].

## 2024-02-11 DIAGNOSIS — K912 Postsurgical malabsorption, not elsewhere classified: Secondary | ICD-10-CM | POA: Diagnosis not present

## 2024-02-11 DIAGNOSIS — Z6826 Body mass index (BMI) 26.0-26.9, adult: Secondary | ICD-10-CM | POA: Diagnosis not present

## 2024-02-11 DIAGNOSIS — Z903 Acquired absence of stomach [part of]: Secondary | ICD-10-CM | POA: Diagnosis not present

## 2024-02-26 ENCOUNTER — Ambulatory Visit: Admitting: *Deleted

## 2024-02-26 ENCOUNTER — Other Ambulatory Visit (INDEPENDENT_AMBULATORY_CARE_PROVIDER_SITE_OTHER): Payer: Self-pay

## 2024-02-26 VITALS — BP 116/82 | HR 64 | Wt 161.8 lb

## 2024-02-26 DIAGNOSIS — Z1339 Encounter for screening examination for other mental health and behavioral disorders: Secondary | ICD-10-CM

## 2024-02-26 DIAGNOSIS — O3680X0 Pregnancy with inconclusive fetal viability, not applicable or unspecified: Secondary | ICD-10-CM

## 2024-02-26 DIAGNOSIS — Z3A08 8 weeks gestation of pregnancy: Secondary | ICD-10-CM | POA: Diagnosis not present

## 2024-02-26 DIAGNOSIS — Z3481 Encounter for supervision of other normal pregnancy, first trimester: Secondary | ICD-10-CM | POA: Diagnosis not present

## 2024-02-26 DIAGNOSIS — Z3480 Encounter for supervision of other normal pregnancy, unspecified trimester: Secondary | ICD-10-CM

## 2024-02-26 MED ORDER — BLOOD PRESSURE KIT DEVI: 1.0000 | 0 refills | Status: AC

## 2024-02-26 NOTE — Patient Instructions (Signed)
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
## 2024-02-28 LAB — CULTURE, OB URINE

## 2024-02-28 LAB — URINE CULTURE, OB REFLEX

## 2024-03-10 ENCOUNTER — Other Ambulatory Visit (HOSPITAL_COMMUNITY)
Admission: RE | Admit: 2024-03-10 | Discharge: 2024-03-10 | Disposition: A | Discharge status: Home / Self Care | Source: Ambulatory Visit | Attending: Advanced Practice Midwife | Admitting: Advanced Practice Midwife

## 2024-03-10 ENCOUNTER — Ambulatory Visit (INDEPENDENT_AMBULATORY_CARE_PROVIDER_SITE_OTHER): Admitting: Advanced Practice Midwife

## 2024-03-10 ENCOUNTER — Encounter: Payer: Self-pay | Admitting: Advanced Practice Midwife

## 2024-03-10 VITALS — BP 122/83 | HR 80 | Wt 165.0 lb

## 2024-03-10 DIAGNOSIS — Z3480 Encounter for supervision of other normal pregnancy, unspecified trimester: Secondary | ICD-10-CM

## 2024-03-10 DIAGNOSIS — Z8759 Personal history of other complications of pregnancy, childbirth and the puerperium: Secondary | ICD-10-CM | POA: Diagnosis not present

## 2024-03-10 DIAGNOSIS — Z3491 Encounter for supervision of normal pregnancy, unspecified, first trimester: Secondary | ICD-10-CM | POA: Insufficient documentation

## 2024-03-10 DIAGNOSIS — Z3481 Encounter for supervision of other normal pregnancy, first trimester: Secondary | ICD-10-CM

## 2024-03-10 DIAGNOSIS — Z3A10 10 weeks gestation of pregnancy: Secondary | ICD-10-CM

## 2024-03-10 MED ORDER — ASPIRIN 81 MG PO TBEC: 81.0000 mg | DELAYED_RELEASE_TABLET | Freq: Every day | ORAL | 5 refills | Status: DC
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
## 2024-03-10 NOTE — Progress Notes (Signed)
 No problems today. Will self-swab for testing.

## 2024-03-11 LAB — CERVICOVAGINAL ANCILLARY ONLY
Chlamydia: NEGATIVE
Comment: NEGATIVE
Comment: NEGATIVE
Comment: NORMAL
Neisseria Gonorrhea: NEGATIVE
Trichomonas: NEGATIVE

## 2024-03-12 LAB — COMPREHENSIVE METABOLIC PANEL WITH GFR
ALT: 12 IU/L (ref 0–32)
AST: 12 IU/L (ref 0–40)
Albumin: 4.1 g/dL (ref 4.0–5.0)
Alkaline Phosphatase: 41 IU/L — ABNORMAL LOW (ref 44–121)
BUN/Creatinine Ratio: 10 (ref 9–23)
BUN: 5 mg/dL — ABNORMAL LOW (ref 6–20)
Bilirubin Total: 0.3 mg/dL (ref 0.0–1.2)
CO2: 20 mmol/L (ref 20–29)
Calcium: 9.6 mg/dL (ref 8.7–10.2)
Chloride: 101 mmol/L (ref 96–106)
Creatinine, Ser: 0.52 mg/dL — ABNORMAL LOW (ref 0.57–1.00)
Globulin, Total: 2.2 g/dL (ref 1.5–4.5)
Glucose: 73 mg/dL (ref 70–99)
Potassium: 4.5 mmol/L (ref 3.5–5.2)
Sodium: 134 mmol/L (ref 134–144)
Total Protein: 6.3 g/dL (ref 6.0–8.5)
eGFR: 131 mL/min/{1.73_m2} (ref >59)

## 2024-03-12 LAB — CBC/D/PLT+RPR+RH+ABO+RUBIGG...
Antibody Screen: NEGATIVE
Basophils Absolute: 0 10*3/uL (ref 0.0–0.2)
Basos: 1 %
EOS (ABSOLUTE): 0.2 10*3/uL (ref 0.0–0.4)
Eos: 3 %
HCV Ab: NONREACTIVE
HIV Screen 4th Generation wRfx: NONREACTIVE
Hematocrit: 37.9 % (ref 34.0–46.6)
Hemoglobin: 12.3 g/dL (ref 11.1–15.9)
Hepatitis B Surface Ag: NEGATIVE
Immature Grans (Abs): 0 10*3/uL (ref 0.0–0.1)
Immature Granulocytes: 0 %
Lymphocytes Absolute: 1.5 10*3/uL (ref 0.7–3.1)
Lymphs: 23 %
MCH: 30.1 pg (ref 26.6–33.0)
MCHC: 32.5 g/dL (ref 31.5–35.7)
MCV: 93 fL (ref 79–97)
Monocytes Absolute: 0.6 10*3/uL (ref 0.1–0.9)
Monocytes: 9 %
Neutrophils Absolute: 4.1 10*3/uL (ref 1.4–7.0)
Neutrophils: 64 %
Platelets: 253 10*3/uL (ref 150–450)
RBC: 4.09 x10E6/uL (ref 3.77–5.28)
RDW: 11.6 % — ABNORMAL LOW (ref 11.7–15.4)
RPR Ser Ql: NONREACTIVE
Rh Factor: NEGATIVE
Rubella Antibodies, IGG: 3.36 {index} (ref >0.99)
WBC: 6.3 10*3/uL (ref 3.4–10.8)

## 2024-03-12 LAB — HCV INTERPRETATION

## 2024-03-14 LAB — PANORAMA PRENATAL TEST FULL PANEL:PANORAMA TEST PLUS 5 ADDITIONAL MICRODELETIONS: FETAL FRACTION: 11.4

## 2024-03-17 LAB — HORIZON CUSTOM: REPORT SUMMARY: NEGATIVE

## 2024-04-07 ENCOUNTER — Other Ambulatory Visit (HOSPITAL_COMMUNITY)
Admission: RE | Admit: 2024-04-07 | Discharge: 2024-04-07 | Disposition: A | Discharge status: Home / Self Care | Source: Ambulatory Visit | Attending: Advanced Practice Midwife | Admitting: Advanced Practice Midwife

## 2024-04-07 ENCOUNTER — Ambulatory Visit (INDEPENDENT_AMBULATORY_CARE_PROVIDER_SITE_OTHER): Admitting: Advanced Practice Midwife

## 2024-04-07 VITALS — BP 111/78 | HR 82 | Wt 172.0 lb

## 2024-04-07 DIAGNOSIS — Z3480 Encounter for supervision of other normal pregnancy, unspecified trimester: Secondary | ICD-10-CM

## 2024-04-07 DIAGNOSIS — N898 Other specified noninflammatory disorders of vagina: Secondary | ICD-10-CM

## 2024-04-07 DIAGNOSIS — O26892 Other specified pregnancy related conditions, second trimester: Secondary | ICD-10-CM | POA: Insufficient documentation

## 2024-04-07 DIAGNOSIS — Z8759 Personal history of other complications of pregnancy, childbirth and the puerperium: Secondary | ICD-10-CM | POA: Diagnosis not present

## 2024-04-07 DIAGNOSIS — R300 Dysuria: Secondary | ICD-10-CM | POA: Diagnosis not present

## 2024-04-07 DIAGNOSIS — Z3A14 14 weeks gestation of pregnancy: Secondary | ICD-10-CM | POA: Diagnosis not present

## 2024-04-07 DIAGNOSIS — O26899 Other specified pregnancy related conditions, unspecified trimester: Secondary | ICD-10-CM

## 2024-04-07 DIAGNOSIS — R109 Unspecified abdominal pain: Secondary | ICD-10-CM
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
## 2024-04-07 NOTE — Addendum Note (Signed)
 Addended by: Ahava Kissoon M on: 04/07/2024 05:15 PM   Modules accepted: Orders

## 2024-04-07 NOTE — Progress Notes (Signed)
 ROB  Reports dysuria, low pelvic discomfort Reports some light spotting and one episode of thick discharge

## 2024-04-09 LAB — CERVICOVAGINAL ANCILLARY ONLY
Bacterial Vaginitis (gardnerella): NEGATIVE
Candida Glabrata: NEGATIVE
Candida Vaginitis: NEGATIVE
Chlamydia: NEGATIVE
Comment: NEGATIVE
Comment: NEGATIVE
Comment: NEGATIVE
Comment: NEGATIVE
Comment: NEGATIVE
Comment: NORMAL
Neisseria Gonorrhea: NEGATIVE
Trichomonas: NEGATIVE

## 2024-04-09 LAB — CULTURE, OB URINE

## 2024-04-09 LAB — URINE CULTURE, OB REFLEX

## 2024-05-04 DIAGNOSIS — Z8759 Personal history of other complications of pregnancy, childbirth and the puerperium: Secondary | ICD-10-CM | POA: Insufficient documentation

## 2024-05-05 ENCOUNTER — Encounter: Payer: Self-pay | Admitting: Advanced Practice Midwife

## 2024-05-05 ENCOUNTER — Ambulatory Visit: Admitting: Advanced Practice Midwife

## 2024-05-05 VITALS — BP 118/84 | HR 82 | Wt 179.0 lb

## 2024-05-05 DIAGNOSIS — Z3A18 18 weeks gestation of pregnancy: Secondary | ICD-10-CM

## 2024-05-05 DIAGNOSIS — Z3482 Encounter for supervision of other normal pregnancy, second trimester: Secondary | ICD-10-CM | POA: Diagnosis not present

## 2024-05-05 DIAGNOSIS — Z3480 Encounter for supervision of other normal pregnancy, unspecified trimester: Secondary | ICD-10-CM

## 2024-05-05 NOTE — Progress Notes (Signed)
   PRENATAL VISIT NOTE  Subjective:  Tara Blake is a 28 y.o. G4P3003 at [redacted]w[redacted]d being seen today for ongoing prenatal care.  She is currently monitored for the following issues for this low-risk pregnancy and has Major depressive disorder, recurrent episode (HCC); History of suicidal ideation; Postgastrectomy malabsorption; History of sleeve gastrectomy; Supervision of other normal pregnancy, antepartum; Iron deficiency; Irritable bowel syndrome with diarrhea; and History of gestational hypertension on their problem list.  Patient reports no complaints.  Contractions: Not present. Vag. Bleeding: None.  Movement: Present. Denies leaking of fluid.   The following portions of the patient's history were reviewed and updated as appropriate: allergies, current medications, past family history, past medical history, past social history, past surgical history and problem list.   Objective:    Vitals:   05/05/24 0938  BP: 118/84  Pulse: 82  Weight: 179 lb (81.2 kg)    Fetal Status:      Movement: Present    General: Alert, oriented and cooperative. Patient is in no acute distress.  Skin: Skin is warm and dry. No rash noted.   Cardiovascular: Normal heart rate noted  Respiratory: Normal respiratory effort, no problems with respiration noted  Abdomen: Soft, gravid, appropriate for gestational age.  Pain/Pressure: Present     Pelvic: Cervical exam deferred        Extremities: Normal range of motion.  Edema: Trace  Mental Status: Normal mood and affect. Normal behavior. Normal judgment and thought content.   Assessment and Plan:  Pregnancy: G4P3003 at [redacted]w[redacted]d 1. Supervision of other normal pregnancy, antepartum (Primary) --Anticipatory guidance about next visits/weeks of pregnancy given.  - AFP, Serum, Open Spina Bifida  2. [redacted] weeks gestation of pregnancy    Preterm labor symptoms and general obstetric precautions including but not limited to vaginal bleeding, contractions, leaking of  fluid and fetal movement were reviewed in detail with the patient. Please refer to After Visit Summary for other counseling recommendations.   No follow-ups on file.  Future Appointments  Date Time Provider Department Center  05/13/2024  8:00 AM [facility] PROVIDER 1 [facility]  05/13/2024  8:30 AM [facility] US1 [facility]    Olam Boards, CNM

## 2024-05-05 NOTE — Progress Notes (Signed)
 Pt presents for ROB visit. Pt c/o spotting last week and back pain

## 2024-05-07 LAB — AFP, SERUM, OPEN SPINA BIFIDA
AFP MoM: 1.83
AFP Value: 70.8 ng/mL
Gest. Age on Collection Date: 18 wk
Maternal Age At EDD: 27.9 a
OSBR Risk 1 IN: 1190
Test Results:: NEGATIVE
Weight: 179 [lb_av]

## 2024-05-11 ENCOUNTER — Other Ambulatory Visit: Payer: Self-pay

## 2024-05-13 ENCOUNTER — Ambulatory Visit: Attending: Obstetrics and Gynecology

## 2024-05-13 ENCOUNTER — Other Ambulatory Visit: Payer: Self-pay | Admitting: Obstetrics and Gynecology

## 2024-05-13 ENCOUNTER — Ambulatory Visit (HOSPITAL_BASED_OUTPATIENT_CLINIC_OR_DEPARTMENT_OTHER): Admitting: Maternal & Fetal Medicine

## 2024-05-13 ENCOUNTER — Other Ambulatory Visit: Payer: Self-pay | Admitting: *Deleted

## 2024-05-13 VITALS — BP 112/74 | HR 88

## 2024-05-13 DIAGNOSIS — O99842 Bariatric surgery status complicating pregnancy, second trimester: Secondary | ICD-10-CM

## 2024-05-13 DIAGNOSIS — M65872 Other synovitis and tenosynovitis, left ankle and foot: Secondary | ICD-10-CM | POA: Diagnosis not present

## 2024-05-13 DIAGNOSIS — O09292 Supervision of pregnancy with other poor reproductive or obstetric history, second trimester: Secondary | ICD-10-CM | POA: Diagnosis not present

## 2024-05-13 DIAGNOSIS — M205X2 Other deformities of toe(s) (acquired), left foot: Secondary | ICD-10-CM | POA: Diagnosis not present

## 2024-05-13 DIAGNOSIS — Z903 Acquired absence of stomach [part of]: Secondary | ICD-10-CM

## 2024-05-13 DIAGNOSIS — Z8759 Personal history of other complications of pregnancy, childbirth and the puerperium: Secondary | ICD-10-CM

## 2024-05-13 DIAGNOSIS — Z369 Encounter for antenatal screening, unspecified: Secondary | ICD-10-CM | POA: Insufficient documentation

## 2024-05-13 DIAGNOSIS — Z8616 Personal history of COVID-19: Secondary | ICD-10-CM | POA: Diagnosis not present

## 2024-05-13 DIAGNOSIS — O3680X0 Pregnancy with inconclusive fetal viability, not applicable or unspecified: Secondary | ICD-10-CM

## 2024-05-13 DIAGNOSIS — M205X1 Other deformities of toe(s) (acquired), right foot: Secondary | ICD-10-CM | POA: Diagnosis not present

## 2024-05-13 DIAGNOSIS — O09299 Supervision of pregnancy with other poor reproductive or obstetric history, unspecified trimester: Secondary | ICD-10-CM

## 2024-05-13 DIAGNOSIS — Z3480 Encounter for supervision of other normal pregnancy, unspecified trimester: Secondary | ICD-10-CM | POA: Insufficient documentation

## 2024-05-13 DIAGNOSIS — O1492 Unspecified pre-eclampsia, second trimester: Secondary | ICD-10-CM | POA: Diagnosis not present

## 2024-05-13 DIAGNOSIS — O43192 Other malformation of placenta, second trimester: Secondary | ICD-10-CM

## 2024-05-13 DIAGNOSIS — O43199 Other malformation of placenta, unspecified trimester: Secondary | ICD-10-CM

## 2024-05-13 DIAGNOSIS — Z3A19 19 weeks gestation of pregnancy: Secondary | ICD-10-CM | POA: Insufficient documentation

## 2024-05-13 DIAGNOSIS — M216X2 Other acquired deformities of left foot: Secondary | ICD-10-CM | POA: Diagnosis not present

## 2024-05-13 DIAGNOSIS — M792 Neuralgia and neuritis, unspecified: Secondary | ICD-10-CM | POA: Diagnosis not present

## 2024-05-13 DIAGNOSIS — M216X1 Other acquired deformities of right foot: Secondary | ICD-10-CM | POA: Diagnosis not present
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
## 2024-05-18 ENCOUNTER — Encounter: Payer: Self-pay | Admitting: Obstetrics and Gynecology

## 2024-06-01 DIAGNOSIS — M65872 Other synovitis and tenosynovitis, left ankle and foot: Secondary | ICD-10-CM | POA: Diagnosis not present

## 2024-06-01 DIAGNOSIS — M792 Neuralgia and neuritis, unspecified: Secondary | ICD-10-CM | POA: Diagnosis not present

## 2024-06-01 DIAGNOSIS — M216X1 Other acquired deformities of right foot: Secondary | ICD-10-CM | POA: Diagnosis not present

## 2024-06-01 DIAGNOSIS — M216X2 Other acquired deformities of left foot: Secondary | ICD-10-CM | POA: Diagnosis not present

## 2024-06-01 DIAGNOSIS — M205X1 Other deformities of toe(s) (acquired), right foot: Secondary | ICD-10-CM | POA: Diagnosis not present

## 2024-06-01 DIAGNOSIS — M205X2 Other deformities of toe(s) (acquired), left foot: Secondary | ICD-10-CM | POA: Diagnosis not present

## 2024-06-03 ENCOUNTER — Encounter: Payer: Self-pay | Admitting: Obstetrics and Gynecology

## 2024-06-03 ENCOUNTER — Ambulatory Visit: Admitting: Obstetrics and Gynecology

## 2024-06-03 VITALS — BP 118/83 | HR 108 | Wt 187.0 lb

## 2024-06-03 DIAGNOSIS — Z8759 Personal history of other complications of pregnancy, childbirth and the puerperium: Secondary | ICD-10-CM | POA: Diagnosis not present

## 2024-06-03 DIAGNOSIS — Z3A22 22 weeks gestation of pregnancy: Secondary | ICD-10-CM | POA: Diagnosis not present

## 2024-06-03 DIAGNOSIS — Z3480 Encounter for supervision of other normal pregnancy, unspecified trimester: Secondary | ICD-10-CM

## 2024-06-03 DIAGNOSIS — O43192 Other malformation of placenta, second trimester: Secondary | ICD-10-CM

## 2024-06-03 NOTE — Progress Notes (Signed)
   PRENATAL VISIT NOTE  Subjective:  Tara Blake is a 28 y.o. G4P3003 at [redacted]w[redacted]d being seen today for ongoing prenatal care.  She is currently monitored for the following issues for this low-risk pregnancy and has Major depressive disorder, recurrent episode (HCC); History of suicidal ideation; Postgastrectomy malabsorption; History of sleeve gastrectomy; Supervision of other normal pregnancy, antepartum; Iron deficiency; Irritable bowel syndrome with diarrhea; History of gestational hypertension; and Marginal insertion of umbilical cord affecting management of mother in second trimester on their problem list.  Patient reports no complaints.  Contractions: Irritability. Vag. Bleeding: None.  Movement: Increased. Denies leaking of fluid.   The following portions of the patient's history were reviewed and updated as appropriate: allergies, current medications, past family history, past medical history, past social history, past surgical history and problem list.   Objective:    Vitals:   06/03/24 0834  BP: 118/83  Pulse: (!) 108  Weight: 187 lb (84.8 kg)    Fetal Status:  Fetal Heart Rate (bpm): 148 Fundal Height: 22 cm Movement: Increased    General: Alert, oriented and cooperative. Patient is in no acute distress.  Skin: Skin is warm and dry. No rash noted.   Cardiovascular: Normal heart rate noted  Respiratory: Normal respiratory effort, no problems with respiration noted  Abdomen: Soft, gravid, appropriate for gestational age.  Pain/Pressure: Absent     Pelvic: Cervical exam deferred        Extremities: Normal range of motion.  Edema: None  Mental Status: Normal mood and affect. Normal behavior. Normal judgment and thought content.   Assessment and Plan:  Pregnancy: G4P3003 at [redacted]w[redacted]d 1. Supervision of other normal pregnancy, antepartum (Primary) BP and FHR normal Doing well, feeling regular movement    2. [redacted] weeks gestation of pregnancy .Anticipatory guidance regarding GTT  and labs next visit, discussed NPO status after midnight    3. Marginal insertion of umbilical cord affecting management of mother in second trimester Discussed with patient EFW 61%, afi normal Serial growths starting at 28 weeks  4. History of gestational hypertension Normotensive, continue ASA and checking BP at home   Preterm labor symptoms and general obstetric precautions including but not limited to vaginal bleeding, contractions, leaking of fluid and fetal movement were reviewed in detail with the patient. Please refer to After Visit Summary for other counseling recommendations.   Return in about 4 weeks (around 07/01/2024) for OB VISIT (MD or APP), 2 hr GTT.  Future Appointments  Date Time Provider Department Center  07/01/2024  8:15 AM [facility] None  07/01/2024 10:15 AM Leftwich-Kirby, Olam LABOR, CNM [facility] None  07/15/2024  8:15 AM [facility] PROVIDER 1 [facility]  07/15/2024  8:30 AM [facility] US4 [facility]    Nidia Daring, FNP

## 2024-06-03 NOTE — Progress Notes (Signed)
 Would like to discuss position of baby's umbilical cord; was told during last US  that it was at the side of the placenta instead of the middle.

## 2024-06-15 ENCOUNTER — Encounter: Payer: Self-pay | Admitting: Advanced Practice Midwife

## 2024-06-22 ENCOUNTER — Telehealth: Payer: Self-pay | Admitting: *Deleted

## 2024-06-22 DIAGNOSIS — F332 Major depressive disorder, recurrent severe without psychotic features: Secondary | ICD-10-CM

## 2024-06-22 NOTE — Progress Notes (Signed)
 Complex Care Management Note  Care Guide Note 06/22/2024 Name: Xolani Degracia MRN: [id number] DOB: 17-Jul-1996  Mckinzey Entwistle is a 28 y.o. year old female who sees [facility] for primary care. I reached out to Randine Keel by phone today to offer complex care management services.  Ms. Mcclary was given information about Complex Care Management services today including:   The Complex Care Management services include support from the care team which includes your Nurse Care Manager, Clinical Social Worker, or Pharmacist.  The Complex Care Management team is here to help remove barriers to the health concerns and goals most important to you. Complex Care Management services are voluntary, and the patient may decline or stop services at any time by request to their care team member.   Complex Care Management Consent Status: Patient did not agree to participate in complex care management services at this time.  Follow up plan: None  Encounter Outcome:  Patient declined   Harlene Satterfield  [facility] Guide  Direct Dial: [phone]  Fax [phone]

## 2024-06-26 DIAGNOSIS — M205X2 Other deformities of toe(s) (acquired), left foot: Secondary | ICD-10-CM | POA: Diagnosis not present

## 2024-06-26 DIAGNOSIS — M792 Neuralgia and neuritis, unspecified: Secondary | ICD-10-CM | POA: Diagnosis not present

## 2024-06-26 DIAGNOSIS — M205X1 Other deformities of toe(s) (acquired), right foot: Secondary | ICD-10-CM | POA: Diagnosis not present

## 2024-06-26 DIAGNOSIS — M216X1 Other acquired deformities of right foot: Secondary | ICD-10-CM | POA: Diagnosis not present

## 2024-06-26 DIAGNOSIS — M216X2 Other acquired deformities of left foot: Secondary | ICD-10-CM | POA: Diagnosis not present

## 2024-06-26 DIAGNOSIS — M65872 Other synovitis and tenosynovitis, left ankle and foot: Secondary | ICD-10-CM | POA: Diagnosis not present

## 2024-07-01 ENCOUNTER — Other Ambulatory Visit

## 2024-07-01 ENCOUNTER — Ambulatory Visit: Payer: Self-pay | Admitting: Advanced Practice Midwife

## 2024-07-01 ENCOUNTER — Encounter: Payer: Self-pay | Admitting: Advanced Practice Midwife

## 2024-07-01 VITALS — BP 118/83 | HR 114 | Wt 195.6 lb

## 2024-07-01 DIAGNOSIS — O99891 Other specified diseases and conditions complicating pregnancy: Secondary | ICD-10-CM | POA: Diagnosis not present

## 2024-07-01 DIAGNOSIS — G8929 Other chronic pain: Secondary | ICD-10-CM

## 2024-07-01 DIAGNOSIS — Z3480 Encounter for supervision of other normal pregnancy, unspecified trimester: Secondary | ICD-10-CM | POA: Diagnosis not present

## 2024-07-01 DIAGNOSIS — R519 Headache, unspecified: Secondary | ICD-10-CM | POA: Diagnosis not present

## 2024-07-01 DIAGNOSIS — Z3A26 26 weeks gestation of pregnancy: Secondary | ICD-10-CM | POA: Diagnosis not present

## 2024-07-01 DIAGNOSIS — O26893 Other specified pregnancy related conditions, third trimester: Secondary | ICD-10-CM | POA: Insufficient documentation

## 2024-07-01 DIAGNOSIS — M549 Dorsalgia, unspecified: Secondary | ICD-10-CM | POA: Diagnosis not present

## 2024-07-01 NOTE — Progress Notes (Unsigned)
 ROB. Pt here for 2 hour GTT/Labs.   Back pain in lower and middle lumbar (scale of 10 out of 10 some days). Pt states she finds it difficult to get out of bed and describes the pain as very stiff.

## 2024-07-01 NOTE — Progress Notes (Cosign Needed)
   PRENATAL VISIT NOTE  Subjective:  Tara Blake is a 28 y.o. G4P3003 at [redacted]w[redacted]d being seen today for ongoing prenatal care.  She is currently monitored for the following issues for this low-risk pregnancy and has Major depressive disorder, recurrent episode (HCC); History of suicidal ideation; Postgastrectomy malabsorption; History of sleeve gastrectomy; Supervision of other normal pregnancy, antepartum; Iron deficiency; Irritable bowel syndrome with diarrhea; History of gestational hypertension; and Marginal insertion of umbilical cord affecting management of mother in second trimester on their problem list.  Patient reports backache and headache.  Contractions: Irritability. Vag. Bleeding: None.  Movement: Increased. Denies leaking of fluid.   The following portions of the patient's history were reviewed and updated as appropriate: allergies, current medications, past family history, past medical history, past social history, past surgical history and problem list.   Objective:    Vitals:   07/01/24 0933  BP: 118/83  Pulse: (!) 114  Weight: 88.7 kg    Fetal Status:  Fetal Heart Rate (bpm): 144   Movement: Increased    General: Alert, oriented and cooperative. Patient is in no acute distress.  Skin: Skin is warm and dry. No rash noted.   Cardiovascular: Normal heart rate noted  Respiratory: Normal respiratory effort, no problems with respiration noted  Abdomen: Soft, gravid, appropriate for gestational age.  Pain/Pressure: Present     Pelvic: Cervical exam deferred        Extremities: Normal range of motion.  Edema: None  Mental Status: Normal mood and affect. Normal behavior. Normal judgment and thought content.   Assessment and Plan:  Pregnancy: G4P3003 at [redacted]w[redacted]d 1. Supervision of other normal pregnancy, antepartum (Primary) Feeling well. Endorses fetal movement. Denies vaginal bleeding, LOF. - CBC - Glucose Tolerance, 2 Hours w/1 Hour - HIV antibody (with reflex) -  RPR   2. [redacted] weeks gestation of pregnancy -Tdap to be given next visit -Anticipatory guidance for upcoming visits   3. Intractable headache, unspecified chronicity pattern, unspecified headache type -Had severe headaches with floaters, photosensitivity, nausea and vomiting prior to pregnancy. Headaches feel about the same in pregnancy.   -Scheduled to see headache specialist at [facility] next month. Advised to call specialist to see if they will see her while pregnant. If not, she may call to schedule with headache specialist at [facility].  -Continue Excedrin Tension headache  4. Back pain in pregnancy -Advised pregnancy support belt -Heating pad as needed  -Tylenol  as needed    Preterm labor symptoms and general obstetric precautions including but not limited to vaginal bleeding, contractions, leaking of fluid and fetal movement were reviewed in detail with the patient. Please refer to After Visit Summary for other counseling recommendations.   No follow-ups on file.  Future Appointments  Date Time Provider Department Center  07/15/2024  8:15 AM [facility] PROVIDER 1 [facility]  07/15/2024  8:30 AM [facility] US4 [facility]    Rolin Amel, S-WHNP

## 2024-07-02 LAB — CBC
Hematocrit: 37.8 % (ref 34.0–46.6)
Hemoglobin: 11.8 g/dL (ref 11.1–15.9)
MCH: 29.1 pg (ref 26.6–33.0)
MCHC: 31.2 g/dL — ABNORMAL LOW (ref 31.5–35.7)
MCV: 93 fL (ref 79–97)
Platelets: 206 x10E3/uL (ref 150–450)
RBC: 4.06 x10E6/uL (ref 3.77–5.28)
RDW: 11.7 % (ref 11.7–15.4)
WBC: 6.9 x10E3/uL (ref 3.4–10.8)

## 2024-07-02 LAB — RPR: RPR Ser Ql: NONREACTIVE

## 2024-07-02 LAB — HIV ANTIBODY (ROUTINE TESTING W REFLEX): HIV Screen 4th Generation wRfx: NONREACTIVE

## 2024-07-02 LAB — GLUCOSE TOLERANCE, 2 HOURS W/ 1HR
Glucose, 1 hour: 166 mg/dL (ref 70–179)
Glucose, 2 hour: 80 mg/dL (ref 70–152)
Glucose, Fasting: 64 mg/dL — ABNORMAL LOW (ref 70–91)

## 2024-07-14 ENCOUNTER — Ambulatory Visit (INDEPENDENT_AMBULATORY_CARE_PROVIDER_SITE_OTHER): Admitting: Obstetrics and Gynecology

## 2024-07-14 VITALS — BP 111/74 | HR 91 | Wt 201.0 lb

## 2024-07-14 DIAGNOSIS — Z23 Encounter for immunization: Secondary | ICD-10-CM

## 2024-07-14 DIAGNOSIS — O26899 Other specified pregnancy related conditions, unspecified trimester: Secondary | ICD-10-CM | POA: Diagnosis not present

## 2024-07-14 DIAGNOSIS — Z6791 Unspecified blood type, Rh negative: Secondary | ICD-10-CM

## 2024-07-14 DIAGNOSIS — Z3A28 28 weeks gestation of pregnancy: Secondary | ICD-10-CM | POA: Diagnosis not present

## 2024-07-14 DIAGNOSIS — Z3480 Encounter for supervision of other normal pregnancy, unspecified trimester: Secondary | ICD-10-CM

## 2024-07-14 DIAGNOSIS — Z903 Acquired absence of stomach [part of]: Secondary | ICD-10-CM | POA: Diagnosis not present

## 2024-07-14 DIAGNOSIS — O43199 Other malformation of placenta, unspecified trimester: Secondary | ICD-10-CM

## 2024-07-14 NOTE — Progress Notes (Signed)
   PRENATAL VISIT NOTE  Subjective:  Tara Blake is a 28 y.o. G4P3003 at [redacted]w[redacted]d being seen today for ongoing prenatal care.  She is currently monitored for the following issues for this high-risk pregnancy and has Major depressive disorder, recurrent episode (HCC); History of suicidal ideation; Postgastrectomy malabsorption; History of sleeve gastrectomy; Supervision of other normal pregnancy, antepartum; Iron deficiency; Irritable bowel syndrome with diarrhea; History of gestational hypertension; Marginal insertion of umbilical cord affecting management of mother in second trimester; and Headache in pregnancy, antepartum on their problem list.  Patient reports no complaints.  Contractions: Not present. Vag. Bleeding: None.  Movement: Present. Denies leaking of fluid.   Reports a pregnancy navigator recently came to her house and has questions about what a pregnancy navigator is and who he is.  The following portions of the patient's history were reviewed and updated as appropriate: allergies, current medications, past family history, past medical history, past social history, past surgical history and problem list.   Objective:   Vitals:   07/14/24 1103  BP: 111/74  Pulse: 91  Weight: 201 lb (91.2 kg)   Body mass index is 34.5 kg/m. Total weight gain: 40 lb (18.1 kg)   Fetal Status:     Movement: Present     General:  Alert, oriented and cooperative. Patient is in no acute distress.  Skin: Skin is warm and dry. No rash noted.   Cardiovascular: Normal heart rate noted  Respiratory: Normal respiratory effort, no problems with respiration noted  Abdomen: Soft, gravid, appropriate for gestational age.  Pain/Pressure: Absent     Pelvic: Cervical exam deferred        Extremities: Normal range of motion.  Edema: None  Mental Status: Normal mood and affect. Normal behavior. Normal judgment and thought content.   Assessment and Plan:  Pregnancy: G4P3003 at [redacted]w[redacted]d 1. Supervision of  other normal pregnancy, antepartum (Primary) Anticipatory guidance Tdap today Fetal kick counts reviewed Classes & breastfeeding reviewed Information provided on pregnancy navigator and patient encouraged to verify that this person is who he says he is. She states she has his card at home and that he is very nice and helpful. - Tdap vaccine greater than or equal to 7yo IM  2. Marginal insertion of umbilical cord affecting management of mother Serial growth planned  3. History of sleeve gastrectomy   4. Rh negative state in antepartum period No rhogam in stock today, patient will return for rhogam - Antibody screen  5. [redacted] weeks gestation of pregnancy  - Tdap vaccine greater than or equal to 7yo IM   Preterm labor symptoms and general obstetric precautions including but not limited to vaginal bleeding, contractions, leaking of fluid and fetal movement were reviewed in detail with the patient. Please refer to After Visit Summary for other counseling recommendations.   Return in about 2 weeks (around 07/28/2024).  Future Appointments  Date Time Provider Department Center  07/15/2024  8:15 AM [facility] PROVIDER 1 [facility]  07/15/2024  8:30 AM [facility] US4 [facility]  07/28/2024 10:55 AM Leftwich-Kirby, Olam LABOR, CNM [facility] None  08/11/2024 10:55 AM Leftwich-Kirby, Olam LABOR, CNM [facility] None  08/21/2024  9:30 AM Teague Gretta Darice BRAVO, PA-C [facility]  08/25/2024  1:50 PM Leftwich-Kirby, Olam LABOR, CNM [facility] None    Rollo ONEIDA Bring, MD

## 2024-07-14 NOTE — Progress Notes (Signed)
 ROB, Pt declined FLU vaccine.  TDAP  RD, tolerated well.

## 2024-07-15 ENCOUNTER — Ambulatory Visit

## 2024-07-15 ENCOUNTER — Ambulatory Visit: Attending: Maternal & Fetal Medicine | Admitting: Obstetrics

## 2024-07-15 ENCOUNTER — Other Ambulatory Visit: Payer: Self-pay | Admitting: *Deleted

## 2024-07-15 DIAGNOSIS — O43193 Other malformation of placenta, third trimester: Secondary | ICD-10-CM | POA: Diagnosis not present

## 2024-07-15 DIAGNOSIS — O99843 Bariatric surgery status complicating pregnancy, third trimester: Secondary | ICD-10-CM

## 2024-07-15 DIAGNOSIS — Z3A28 28 weeks gestation of pregnancy: Secondary | ICD-10-CM

## 2024-07-15 DIAGNOSIS — Z903 Acquired absence of stomach [part of]: Secondary | ICD-10-CM

## 2024-07-15 DIAGNOSIS — O09293 Supervision of pregnancy with other poor reproductive or obstetric history, third trimester: Secondary | ICD-10-CM

## 2024-07-15 DIAGNOSIS — Z8759 Personal history of other complications of pregnancy, childbirth and the puerperium: Secondary | ICD-10-CM | POA: Diagnosis not present

## 2024-07-15 DIAGNOSIS — O43199 Other malformation of placenta, unspecified trimester: Secondary | ICD-10-CM

## 2024-07-15 DIAGNOSIS — O358XX0 Maternal care for other (suspected) fetal abnormality and damage, not applicable or unspecified: Secondary | ICD-10-CM

## 2024-07-15 DIAGNOSIS — Z3480 Encounter for supervision of other normal pregnancy, unspecified trimester: Secondary | ICD-10-CM

## 2024-07-15 DIAGNOSIS — Z3689 Encounter for other specified antenatal screening: Secondary | ICD-10-CM | POA: Diagnosis not present

## 2024-07-15 DIAGNOSIS — O09299 Supervision of pregnancy with other poor reproductive or obstetric history, unspecified trimester: Secondary | ICD-10-CM

## 2024-07-15 DIAGNOSIS — Z9884 Bariatric surgery status: Secondary | ICD-10-CM | POA: Insufficient documentation

## 2024-07-15 LAB — ANTIBODY SCREEN: Antibody Screen: NEGATIVE

## 2024-07-15 NOTE — Progress Notes (Signed)
 MFM Consult Note  Tara Blake is currently at 28 weeks and 4 days.  She has been followed due to history of gastric bypass surgery and a marginal placental cord insertion noted on her prior exam.    She denies any problems since her last exam and reports feeling fetal movements throughout the day.  Sonographic findings Single intrauterine pregnancy at 28w 4d.  Fetal cardiac activity:  Observed and appears normal. Presentation: Cephalic. Interval fetal anatomy appears normal. Fetal biometry shows the estimated fetal weight of 3 pounds 4 ounces which measures at the 83rd percentile. Amniotic fluid volume: Within normal limits. MVP: 4.78 cm. Placenta: Posterior.  There are limitations of prenatal ultrasound such as the inability to detect certain abnormalities due to poor visualization. Various factors such as fetal position, gestational age and maternal body habitus may increase the difficulty in visualizing the fetal anatomy.    Due to her history of gastric bypass surgery and the marginal placental cord insertion, we will continue to follow her with growth ultrasounds throughout her pregnancy.  A follow-up exam was scheduled in 4 weeks.    The patient stated that all of her questions were answered today.  A total of 10 minutes was spent counseling and coordinating the care for this patient.  Greater than 50% of the time was spent in direct face-to-face contact.

## 2024-07-28 ENCOUNTER — Ambulatory Visit (INDEPENDENT_AMBULATORY_CARE_PROVIDER_SITE_OTHER): Admitting: Advanced Practice Midwife

## 2024-07-28 ENCOUNTER — Encounter: Payer: Self-pay | Admitting: Advanced Practice Midwife

## 2024-07-28 VITALS — BP 116/79 | HR 81 | Wt 199.0 lb

## 2024-07-28 DIAGNOSIS — R519 Headache, unspecified: Secondary | ICD-10-CM | POA: Diagnosis not present

## 2024-07-28 DIAGNOSIS — O26893 Other specified pregnancy related conditions, third trimester: Secondary | ICD-10-CM | POA: Diagnosis not present

## 2024-07-28 DIAGNOSIS — O26899 Other specified pregnancy related conditions, unspecified trimester: Secondary | ICD-10-CM

## 2024-07-28 DIAGNOSIS — Z3A30 30 weeks gestation of pregnancy: Secondary | ICD-10-CM

## 2024-07-28 DIAGNOSIS — Z6791 Unspecified blood type, Rh negative: Secondary | ICD-10-CM

## 2024-07-28 DIAGNOSIS — O360931 Maternal care for other rhesus isoimmunization, third trimester, fetus 1: Secondary | ICD-10-CM

## 2024-07-28 DIAGNOSIS — Z3480 Encounter for supervision of other normal pregnancy, unspecified trimester: Secondary | ICD-10-CM

## 2024-07-28 MED ORDER — RHO D IMMUNE GLOBULIN 1500 UNIT/2ML IJ SOSY
300.0000 ug | PREFILLED_SYRINGE | Freq: Once | INTRAMUSCULAR | Status: AC
  Administered 2024-07-28: 300 ug via INTRAMUSCULAR

## 2024-07-28 NOTE — Progress Notes (Unsigned)
 Pt presents for ROB visit. Pt c/o lower abd pressure and braxton hicks with back pain.

## 2024-07-28 NOTE — Progress Notes (Unsigned)
   PRENATAL VISIT NOTE  Subjective:  Tara Blake is a 28 y.o. G4P3003 at [redacted]w[redacted]d being seen today for ongoing prenatal care.  She is currently monitored for the following issues for this {Blank single:19197::high-risk,low-risk} pregnancy and has Major depressive disorder, recurrent episode; History of suicidal ideation; Postgastrectomy malabsorption; History of sleeve gastrectomy; Supervision of other normal pregnancy, antepartum; Iron deficiency; Irritable bowel syndrome with diarrhea; History of gestational hypertension; Marginal insertion of umbilical cord affecting management of mother in second trimester; and Headache in pregnancy, antepartum on their problem list.  Patient reports {sx:14538}.  Contractions: Irritability. Vag. Bleeding: None.  Movement: Present. Denies leaking of fluid.   The following portions of the patient's history were reviewed and updated as appropriate: allergies, current medications, past family history, past medical history, past social history, past surgical history and problem list.   Objective:    Vitals:   07/28/24 1109  BP: 116/79  Pulse: 81  Weight: 90.3 kg    Fetal Status:  Fetal Heart Rate (bpm): 143   Movement: Present    General: Alert, oriented and cooperative. Patient is in no acute distress.  Skin: Skin is warm and dry. No rash noted.   Cardiovascular: Normal heart rate noted  Respiratory: Normal respiratory effort, no problems with respiration noted  Abdomen: Soft, gravid, appropriate for gestational age.  Pain/Pressure: Present     Pelvic: {Blank single:19197::Cervical exam performed in the presence of a chaperone,Cervical exam deferred}        Extremities: Normal range of motion.  Edema: None  Mental Status: Normal mood and affect. Normal behavior. Normal judgment and thought content.   Assessment and Plan:  Pregnancy: G4P3003 at [redacted]w[redacted]d There are no diagnoses linked to this encounter. {Blank single:19197::Term,Preterm} labor  symptoms and general obstetric precautions including but not limited to vaginal bleeding, contractions, leaking of fluid and fetal movement were reviewed in detail with the patient. Please refer to After Visit Summary for other counseling recommendations.   No follow-ups on file.  Future Appointments  Date Time Provider Department Center  08/11/2024 10:55 AM Milly Olam LABOR, CNM [facility] None  08/13/2024  1:15 PM [facility] PROVIDER 1 [facility]  08/13/2024  1:30 PM [facility] US2 [facility]  08/21/2024  9:30 AM Teague Gretta Darice BRAVO, PA-C [facility]  08/25/2024  1:50 PM Leftwich-Kirby, Olam LABOR, CNM [facility] None    Rolin GORMAN Amel, RN

## 2024-08-04 ENCOUNTER — Other Ambulatory Visit: Payer: Self-pay

## 2024-08-04 ENCOUNTER — Encounter (HOSPITAL_COMMUNITY): Payer: Self-pay | Admitting: Obstetrics & Gynecology

## 2024-08-04 ENCOUNTER — Inpatient Hospital Stay (HOSPITAL_COMMUNITY)
Admission: AD | Admit: 2024-08-04 | Discharge: 2024-08-04 | Disposition: A | Discharge status: Home / Self Care | Attending: Family Medicine | Admitting: Family Medicine

## 2024-08-04 DIAGNOSIS — O99353 Diseases of the nervous system complicating pregnancy, third trimester: Secondary | ICD-10-CM | POA: Diagnosis not present

## 2024-08-04 DIAGNOSIS — Z3689 Encounter for other specified antenatal screening: Secondary | ICD-10-CM

## 2024-08-04 DIAGNOSIS — O368130 Decreased fetal movements, third trimester, not applicable or unspecified: Secondary | ICD-10-CM | POA: Diagnosis present

## 2024-08-04 DIAGNOSIS — O26893 Other specified pregnancy related conditions, third trimester: Secondary | ICD-10-CM

## 2024-08-04 DIAGNOSIS — R519 Headache, unspecified: Secondary | ICD-10-CM | POA: Diagnosis not present

## 2024-08-04 DIAGNOSIS — Z3A31 31 weeks gestation of pregnancy: Secondary | ICD-10-CM | POA: Diagnosis not present

## 2024-08-04 DIAGNOSIS — G43909 Migraine, unspecified, not intractable, without status migrainosus: Secondary | ICD-10-CM | POA: Insufficient documentation

## 2024-08-04 DIAGNOSIS — Z8669 Personal history of other diseases of the nervous system and sense organs: Secondary | ICD-10-CM

## 2024-08-04 MED ORDER — CYCLOBENZAPRINE HCL 10 MG PO TABS: 10.0000 mg | ORAL_TABLET | Freq: Two times a day (BID) | ORAL | 0 refills | Status: DC | PRN

## 2024-08-04 MED ORDER — BUTALBITAL-APAP-CAFFEINE 50-325-40 MG PO TABS
2.0000 | ORAL_TABLET | Freq: Once | ORAL | 0 refills | Status: DC | PRN
Start: 2024-08-04 — End: 2024-08-21

## 2024-08-04 MED ORDER — EXCEDRIN TENSION HEADACHE 500-65 MG PO TABS: 2.0000 | ORAL_TABLET | Freq: Four times a day (QID) | ORAL | Status: DC | PRN

## 2024-08-04 MED ORDER — PROCHLORPERAZINE MALEATE 10 MG PO TABS
10.0000 mg | ORAL_TABLET | Freq: Once | ORAL | Status: AC
  Administered 2024-08-04: 10 mg via ORAL
  Filled 2024-08-04: qty 1

## 2024-08-04 MED ORDER — CYCLOBENZAPRINE HCL 5 MG PO TABS
10.0000 mg | ORAL_TABLET | Freq: Once | ORAL | Status: AC
Start: 2024-08-04 — End: 2024-08-04
  Administered 2024-08-04: 10 mg via ORAL
  Filled 2024-08-04: qty 2

## 2024-08-04 MED ORDER — PROCHLORPERAZINE EDISYLATE 10 MG/2ML IJ SOLN
10.0000 mg | Freq: Once | INTRAMUSCULAR | Status: DC
  Filled 2024-08-04: qty 2

## 2024-08-04 MED ORDER — ACETAMINOPHEN-CAFFEINE 500-65 MG PO TABS
2.0000 | ORAL_TABLET | Freq: Once | ORAL | Status: AC
  Administered 2024-08-04: 2 via ORAL
  Filled 2024-08-04: qty 2
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
## 2024-08-04 NOTE — Discharge Instructions (Signed)
 Return to MAU if you HA persists or worsens despite the medication  Make sure you are eating and drinking normal amounts.   You were given prescriptions for medication to help stop your headaches  Please keep your appointment with the headache clinic

## 2024-08-04 NOTE — MAU Note (Addendum)
 Tara Blake is a 28 y.o. at [redacted]w[redacted]d here in MAU reporting: headache/migraine that has been ongoing for 3 days. HA pain on left side of her head, jaw pain, neck pain, left arm pain, and having shocking pains in her spine. States since she has been pregnant her headaches and migraines have worsened, but this is the worst one yet. Has tried tylenol , Excedrin, caffeine , water, and rest with no relief. Reports nausea but no vomiting. Reports some braxton hicks ctxs. Denies any LOF or VB. Reports less fetal movement than normal.  Reports an increase in white milky vaginal discharge 1 week ago. Patient denies any vaginal odor, itching or pain.   Was seen at [facility] office for discharge and was told it was normal and not concerning. Did not have any vaginal swabs done that day.  Onset of complaint: Saturday Pain score: 6 Vitals:   08/04/24 0925 08/04/24 0926  BP:  130/87  Pulse:  86  Resp:  17  Temp:  98.8 F (37.1 C)  SpO2: 98%      FHT:160 Lab orders placed from triage:

## 2024-08-11 ENCOUNTER — Telehealth (INDEPENDENT_AMBULATORY_CARE_PROVIDER_SITE_OTHER): Admitting: Advanced Practice Midwife

## 2024-08-11 ENCOUNTER — Encounter: Payer: Self-pay | Admitting: Advanced Practice Midwife

## 2024-08-11 VITALS — BP 130/86 | HR 90

## 2024-08-11 DIAGNOSIS — R102 Pelvic and perineal pain unspecified side: Secondary | ICD-10-CM

## 2024-08-11 DIAGNOSIS — R519 Headache, unspecified: Secondary | ICD-10-CM

## 2024-08-11 DIAGNOSIS — Z3483 Encounter for supervision of other normal pregnancy, third trimester: Secondary | ICD-10-CM

## 2024-08-11 DIAGNOSIS — F332 Major depressive disorder, recurrent severe without psychotic features: Secondary | ICD-10-CM

## 2024-08-11 DIAGNOSIS — Z3A32 32 weeks gestation of pregnancy: Secondary | ICD-10-CM | POA: Diagnosis not present

## 2024-08-11 DIAGNOSIS — Z3480 Encounter for supervision of other normal pregnancy, unspecified trimester: Secondary | ICD-10-CM
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
## 2024-08-11 NOTE — Progress Notes (Signed)
 S/w pt for virtual ROB visit. Pt was seen at MAU 08-04-24. No concerns today.

## 2024-08-13 ENCOUNTER — Other Ambulatory Visit: Payer: Self-pay | Admitting: *Deleted

## 2024-08-13 ENCOUNTER — Ambulatory Visit

## 2024-08-13 ENCOUNTER — Ambulatory Visit: Attending: Obstetrics | Admitting: Maternal & Fetal Medicine

## 2024-08-13 VITALS — BP 136/83

## 2024-08-13 DIAGNOSIS — O09293 Supervision of pregnancy with other poor reproductive or obstetric history, third trimester: Secondary | ICD-10-CM

## 2024-08-13 DIAGNOSIS — Z3A32 32 weeks gestation of pregnancy: Secondary | ICD-10-CM | POA: Diagnosis not present

## 2024-08-13 DIAGNOSIS — O3663X0 Maternal care for excessive fetal growth, third trimester, not applicable or unspecified: Secondary | ICD-10-CM | POA: Insufficient documentation

## 2024-08-13 DIAGNOSIS — Z8759 Personal history of other complications of pregnancy, childbirth and the puerperium: Secondary | ICD-10-CM

## 2024-08-13 DIAGNOSIS — Z903 Acquired absence of stomach [part of]: Secondary | ICD-10-CM | POA: Diagnosis not present

## 2024-08-13 DIAGNOSIS — O358XX0 Maternal care for other (suspected) fetal abnormality and damage, not applicable or unspecified: Secondary | ICD-10-CM | POA: Insufficient documentation

## 2024-08-13 DIAGNOSIS — O99843 Bariatric surgery status complicating pregnancy, third trimester: Secondary | ICD-10-CM

## 2024-08-13 DIAGNOSIS — O43193 Other malformation of placenta, third trimester: Secondary | ICD-10-CM | POA: Diagnosis not present

## 2024-08-13 DIAGNOSIS — O43192 Other malformation of placenta, second trimester: Secondary | ICD-10-CM

## 2024-08-13 NOTE — Progress Notes (Addendum)
   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 G4P3003 at [redacted]w[redacted]d here for ultrasound and consultation. Brooks L Grigorian is doing well today with no acute concerns. Today we focused on the following:   LGA: Today the estimated fetal weight is greater than 90th percentile.  The patient reports that she has had babies in the 9 to 10 pound range.  This is likely normal for her.  She passed her glucose test.  She will follow-up ultrasound at 37 weeks.  The patient had time to ask questions that were answered to her satisfaction.  She verbalized understanding and agrees to proceed with the plan below.  Sonographic findings Single intrauterine pregnancy at 32w 5d.  Fetal cardiac activity:  Observed and appears normal. Presentation: Cephalic. Interval fetal anatomy appears normal. Fetal biometry shows the estimated fetal weight at the 93 percentile. Amniotic fluid volume: Within normal limits. MVP: 5.59 cm. Placenta: Posterior.  There are limitations of prenatal ultrasound such as the inability to detect certain abnormalities due to poor visualization. Various factors such as fetal position, gestational age and maternal body habitus may  increase the difficulty in visualizing the fetal anatomy.    Recommendations - follow-up ultrasound at 37 weeks.  Review of Systems: A review of systems was performed and was negative except per HPI   Vitals and Physical Exam    08/13/2024    1:21 PM 08/11/2024   11:07 AM 08/04/2024   12:19 PM  Vitals with BMI  Systolic 136 130 887  Diastolic 83 86 77  Pulse  90 84    Sitting comfortably on the sonogram table Nonlabored breathing Normal rate and rhythm Abdomen is nontender  Past pregnancies OB History  Gravida Para Term Preterm AB Living  4 3 3  0 0 3  SAB IAB Ectopic Multiple Live Births  0 0 0 0 3    # Outcome Date GA Lbr Len/2nd Weight Sex Type Anes PTL Lv  4 Current           3 Term 03/13/19 [redacted]w[redacted]d 01:18 / 00:11 9 lb 10.5 oz (4.38 kg) M Vag-Spont EPI  LIV  2 Term 01/08/15   9 lb 6 oz (4.252 kg) M Vag-Spont EPI  LIV  1 Term 10/02/13   10 lb 4 oz (4.649 kg) M Vag-Spont   LIV     I spent 10 minutes reviewing the patients chart, including labs and images as well as counseling the patient about her medical conditions. Greater than 50% of the time was spent in direct face-to-face patient counseling.  Delora Smaller  MFM, [place]   08/13/2024  2:09 PM

## 2024-08-15 ENCOUNTER — Encounter: Payer: Self-pay | Admitting: Advanced Practice Midwife

## 2024-08-17 ENCOUNTER — Telehealth: Payer: Self-pay

## 2024-08-20 ENCOUNTER — Encounter: Payer: Self-pay | Admitting: Advanced Practice Midwife

## 2024-08-21 ENCOUNTER — Ambulatory Visit: Admitting: Physician Assistant

## 2024-08-21 ENCOUNTER — Encounter: Payer: Self-pay | Admitting: Physician Assistant

## 2024-08-21 VITALS — BP 123/83 | HR 53 | Wt 205.8 lb

## 2024-08-21 DIAGNOSIS — G43109 Migraine with aura, not intractable, without status migrainosus: Secondary | ICD-10-CM

## 2024-08-21 DIAGNOSIS — Z3A33 33 weeks gestation of pregnancy: Secondary | ICD-10-CM

## 2024-08-21 DIAGNOSIS — O26893 Other specified pregnancy related conditions, third trimester: Secondary | ICD-10-CM | POA: Diagnosis not present

## 2024-08-21 MED ORDER — CYCLOBENZAPRINE HCL 10 MG PO TABS: 10.0000 mg | ORAL_TABLET | Freq: Three times a day (TID) | ORAL | 1 refills | Status: AC | PRN

## 2024-08-21 MED ORDER — BUTALBITAL-APAP-CAFFEINE 50-325-40 MG PO CAPS: 1.0000 | ORAL_CAPSULE | Freq: Four times a day (QID) | ORAL | 3 refills | Status: AC | PRN

## 2024-08-21 MED ORDER — METOCLOPRAMIDE HCL 10 MG PO TABS: 10.0000 mg | ORAL_TABLET | Freq: Three times a day (TID) | ORAL | 2 refills | Status: DC | PRN
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
## 2024-08-21 NOTE — Patient Instructions (Signed)
 Migraine Headache A migraine headache is a very strong throbbing pain on one or both sides of your head. This type of headache can also cause other symptoms. It can last from 4 hours to 3 days. Talk with your doctor about what things may bring on (trigger) this condition. What are the causes? The exact cause of a migraine is not known. This condition may be brought on or caused by: Smoking. Medicines, such as: Medicine used to treat chest pain (nitroglycerin). Birth control pills. Estrogen. Some blood pressure medicines. Certain substances in some foods or drinks. Foods and drinks, such as: Cheese. Chocolate. Alcohol. Caffeine. Doing physical activity that is very hard. Other things that may trigger a migraine headache include: Periods. Pregnancy. Hunger. Stress. Getting too much or too little sleep. Weather changes. Feeling tired (fatigue). What increases the risk? Being 18-65 years old. Being female. Having a family history of migraine headaches. Being Caucasian. Having a mental health condition, such as being sad (depressed) or feeling worried or nervous (anxious). Being very overweight (obese). What are the signs or symptoms? A throbbing pain. This pain may: Happen in any area of the head, such as on one or both sides. Make it hard to do daily activities. Get worse with physical activity. Get worse around bright lights, loud noises, or smells. Other symptoms may include: Feeling like you may vomit (nauseous). Vomiting. Dizziness. Before a migraine headache starts, you may get warning signs (an aura). An aura may include: Seeing flashing lights or having blind spots. Seeing bright spots, halos, or zigzag lines. Having tunnel vision or blurred vision. Having numbness or a tingling feeling. Having trouble talking. Having weak muscles. After a migraine ends, you may have symptoms. These may include: Tiredness. Trouble thinking (concentrating). How is this  treated? Taking medicines that: Relieve pain. Relieve the feeling like you may vomit. Prevent migraine headaches. Treatment may also include: Acupuncture. Lifestyle changes like avoiding foods that bring on migraine headaches. Learning ways to control your body functions (biofeedback). Therapy to help you know and deal with negative thoughts (cognitive behavioral therapy). Follow these instructions at home: Medicines Take over-the-counter and prescription medicines only as told by your doctor. If told, take steps to prevent problems with pooping (constipation). You may need to: Drink enough fluid to keep your pee (urine) pale yellow. Take medicines. You will be told what medicines to take. Eat foods that are high in fiber. These include beans, whole grains, and fresh fruits and vegetables. Limit foods that are high in fat and sugar. These include fried or sweet foods. Ask your doctor if you should avoid driving or using machines while you are taking your medicine. Lifestyle  Do not drink alcohol. Do not smoke or use any products that contain nicotine or tobacco. If you need help quitting, ask your doctor. Get 7-9 hours of sleep each night, or the amount recommended by your doctor. Find ways to deal with stress, such as meditation, deep breathing, or yoga. Try to exercise often. This can help lessen how bad and how often your migraines happen. General instructions Keep a journal to find out what may bring on your migraine headaches. This can help you avoid those things. For example, write down: What you eat and drink. How much sleep you get. Any change to your medicines or diet. If you have a migraine headache: Avoid things that make your symptoms worse, such as bright lights. Lie down in a dark, quiet room. Do not drive or use machinery. Ask your  doctor what activities are safe for you. Where to find more information Coalition for Headache and Migraine Patients (CHAMP):  headachemigraine.org American Migraine Foundation: americanmigrainefoundation.org National Headache Foundation: headaches.org Contact a doctor if: You get a migraine headache that is different or worse than others you have had. You have more than 15 days of headaches in one month. Get help right away if: Your migraine headache gets very bad. Your migraine headache lasts more than 72 hours. You have a fever or stiff neck. You have trouble seeing. Your muscles feel weak or like you cannot control them. You lose your balance a lot. You have trouble walking. You faint. You have a seizure. This information is not intended to replace advice given to you by your health care provider. Make sure you discuss any questions you have with your health care provider. Document Revised: 06/11/2022 Document Reviewed: 06/11/2022 Elsevier Patient Education  2024 [employer].

## 2024-08-25 ENCOUNTER — Telehealth: Admitting: Advanced Practice Midwife

## 2024-08-26 ENCOUNTER — Inpatient Hospital Stay (HOSPITAL_COMMUNITY)
Admission: AD | Admit: 2024-08-26 | Discharge: 2024-08-26 | Disposition: A | Discharge status: Home / Self Care | Attending: Obstetrics and Gynecology | Admitting: Obstetrics and Gynecology

## 2024-08-26 ENCOUNTER — Encounter (HOSPITAL_COMMUNITY): Payer: Self-pay | Admitting: Obstetrics and Gynecology

## 2024-08-26 DIAGNOSIS — Z0371 Encounter for suspected problem with amniotic cavity and membrane ruled out: Secondary | ICD-10-CM

## 2024-08-26 DIAGNOSIS — N898 Other specified noninflammatory disorders of vagina: Secondary | ICD-10-CM | POA: Diagnosis present

## 2024-08-26 DIAGNOSIS — Z3A34 34 weeks gestation of pregnancy: Secondary | ICD-10-CM

## 2024-08-26 DIAGNOSIS — Z3480 Encounter for supervision of other normal pregnancy, unspecified trimester: Secondary | ICD-10-CM

## 2024-08-26 DIAGNOSIS — Z3689 Encounter for other specified antenatal screening: Secondary | ICD-10-CM

## 2024-08-26 NOTE — MAU Provider Note (Signed)
   None      S: [name] is a 28 y.o. [phone] at [redacted]w[redacted]d  who presents to MAU today complaining of leaking of fluid since 0300.  She states she woke up and noted that her pantyliner was soaked.  She went to the bathroom and continued to trickle on the way.  She denies vaginal bleeding. She denies contractions, but reports cramping that started around 0200. She reports normal fetal movement.    O: BP 115/84   Pulse (!) 111   Temp 98 F (36.7 C) (Oral)   Resp 12   Ht 5' 4 (1.626 m)   Wt 92.9 kg   LMP 12/28/2023 (Exact Date)   BMI 35.15 kg/m  GENERAL: Well-developed, well-nourished female in no acute distress.  HEAD: Normocephalic, atraumatic.  CHEST: Normal effort of breathing, regular heart rate ABDOMEN: Soft, nontender, gravid PELVIC: Normal external female genitalia. Vagina is pink and rugated. Cervix with normal contour, no lesions. Small amt whitish clear mucoid discharge.  Negative pooling. Fern Collected  Cervical exam:   Deferred. Appears closed visually    Fetal Monitoring: FHT: 155 bpm, Mod Var, -Decels, +Accels Toco: No ctx graphed  No results found for this or any previous visit (from the past 24 hours).   A: SIUP at [redacted]w[redacted]d  Membranes intact Cat I FT  P: -Pelvic exam completed and patient informed findings are not reflective of pPROM.  -Will review fern and if findings inconclusive or concerning will perform amnisure.  -Patient agreeable and without questions. -NST reactive.   Synthia Raisin, CNM 08/26/2024 5:01 AM  Reassessment (5:25 AM) -Odetta negative. -Nurse to give precautions. -Encouraged to call primary office or return to MAU if symptoms worsen or with the onset of new symptoms. -Discharged to home in stable condition.  Raisin LITTIE Synthia MSN, CNM Advanced Practice Provider, [facility] [employer]

## 2024-08-26 NOTE — MAU Note (Signed)
 Pt says at 0300- woke and panty liner was soaking wet.   Fluid continue s to come out - clear.    Last sex- Monday [facility] No VE Back pain started Sat night  Cramping  started tonight at 0200 Denies HSV

## 2024-09-08 ENCOUNTER — Ambulatory Visit (INDEPENDENT_AMBULATORY_CARE_PROVIDER_SITE_OTHER): Admitting: Advanced Practice Midwife

## 2024-09-08 ENCOUNTER — Other Ambulatory Visit (HOSPITAL_COMMUNITY)
Admission: RE | Admit: 2024-09-08 | Discharge: 2024-09-08 | Disposition: A | Discharge status: Home / Self Care | Source: Ambulatory Visit | Attending: Advanced Practice Midwife | Admitting: Advanced Practice Midwife

## 2024-09-08 VITALS — BP 115/85 | HR 97 | Wt 206.4 lb

## 2024-09-08 DIAGNOSIS — O09293 Supervision of pregnancy with other poor reproductive or obstetric history, third trimester: Secondary | ICD-10-CM | POA: Diagnosis not present

## 2024-09-08 DIAGNOSIS — Z3A36 36 weeks gestation of pregnancy: Secondary | ICD-10-CM | POA: Diagnosis not present

## 2024-09-08 DIAGNOSIS — Z23 Encounter for immunization: Secondary | ICD-10-CM | POA: Diagnosis not present

## 2024-09-08 DIAGNOSIS — Z3480 Encounter for supervision of other normal pregnancy, unspecified trimester: Secondary | ICD-10-CM | POA: Diagnosis not present

## 2024-09-08 NOTE — Progress Notes (Signed)
 Pt presents for rob. Pt has no questions or concerns at this time.

## 2024-09-08 NOTE — Addendum Note (Signed)
 Addended by: MARCINE GAINS on: 09/08/2024 02:25 PM   Modules accepted: Orders

## 2024-09-08 NOTE — Progress Notes (Addendum)
 PRENATAL VISIT NOTE  Subjective:  Tara Blake is a 28 y.o. [phone] at [redacted]w[redacted]d being seen today for ongoing prenatal care.  She is currently monitored for the following issues for this low-risk pregnancy and has Major depressive disorder, recurrent episode; History of suicidal ideation; Postgastrectomy malabsorption; History of sleeve gastrectomy; Supervision of other normal pregnancy, antepartum; Iron deficiency; Irritable bowel syndrome with diarrhea; History of gestational hypertension; Marginal insertion of umbilical cord affecting management of mother in second trimester; Pregnancy headache in third trimester; and Migraine with aura and without status migrainosus, not intractable on their problem list.  Patient reports occasional contractions.  Contractions: Irritability. Vag. Bleeding: None.  Movement: Present. Denies leaking of fluid.   The following portions of the patient's history were reviewed and updated as appropriate: allergies, current medications, past family history, past medical history, past social history, past surgical history and problem list.   Objective:   Vitals:   09/08/24 1148 09/08/24 1153  BP: (!) 127/94 115/85  Pulse: 89 97  Weight: 206 lb 6.4 oz (93.6 kg)     Fetal Status:  Fetal Heart Rate (bpm): 145 Fundal Height: 36 cm Movement: Present    General: Alert, oriented and cooperative. Patient is in no acute distress.  Skin: Skin is warm and dry. No rash noted.   Cardiovascular: Normal heart rate noted  Respiratory: Normal respiratory effort, no problems with respiration noted  Abdomen: Soft, gravid, appropriate for gestational age.  Pain/Pressure: Present     Pelvic: Cervical exam performed in the presence of a chaperone Dilation: 5 Effacement (%): 50 Station: -2  Extremities: Normal range of motion.  Edema: None  Mental Status: Normal mood and affect. Normal behavior. Normal judgment and thought content.      07/14/2024   10:58 AM 03/10/2024   10:08  AM 02/26/2024    9:37 AM  Depression screen PHQ 2/9  Decreased Interest 0 0 0  Down, Depressed, Hopeless 0 0 0  PHQ - 2 Score 0 0 0  Altered sleeping 0 0 0  Tired, decreased energy 0  0  Change in appetite 0 0 0  Feeling bad or failure about yourself  0 0 0  Trouble concentrating 0 0 0  Moving slowly or fidgety/restless 0 0 0  Suicidal thoughts 0 0 0  PHQ-9 Score 0  0  0   Difficult doing work/chores Not difficult at all       Data saved with a previous flowsheet row definition        07/14/2024   11:01 AM 03/10/2024   10:09 AM 02/26/2024    9:38 AM 07/18/2023    2:43 PM  GAD 7 : Generalized Anxiety Score  Nervous, Anxious, on Edge 0 0 0 0  Control/stop worrying 0 0 0 0  Worry too much - different things 0 0 0 0  Trouble relaxing 0 0 0 0  Restless 0 0 0 0  Easily annoyed or irritable 0 0 1 0  Afraid - awful might happen 0 0 0 0  Total GAD 7 Score 0 0 1 0  Anxiety Difficulty Not difficult at all       Assessment and Plan:  Pregnancy: G4P3003 at [redacted]w[redacted]d 1. Supervision of other normal pregnancy, antepartum (Primary) --Anticipatory guidance about next visits/weeks of pregnancy given.  --Reviewed s/sx of labor, given 5 cm dilation, no evidence of active labor today  - Cervicovaginal ancillary only( ) - Culture, beta strep (group b only)  2. [redacted] weeks  gestation of pregnancy    3. Hx of macrosomia in infant in prior pregnancy, currently pregnant, third trimester --All 3 prior pregnancies with 9-10 lb babies, no complications at delivery --EFW this pregnancy 93%, pt with f/u US  on 11/24   Term labor symptoms and general obstetric precautions including but not limited to vaginal bleeding, contractions, leaking of fluid and fetal movement were reviewed in detail with the patient. Please refer to After Visit Summary for other counseling recommendations.   Return in about 1 week (around 09/15/2024) for Midwife preferred.  Future Appointments  Date Time Provider  Department Center  09/15/2024 10:55 AM Erik Kieth BROCKS, MD [facility] None  09/21/2024 11:15 AM [facility] PROVIDER 1 [facility]  09/21/2024 11:30 AM [facility] US4 [facility]    Olam Boards, CNM

## 2024-09-09 ENCOUNTER — Inpatient Hospital Stay (HOSPITAL_COMMUNITY)
Admission: AD | Admit: 2024-09-09 | Discharge: 2024-09-12 | DRG: 807 | Disposition: A | Discharge status: Home / Self Care | Payer: Self-pay | Attending: Obstetrics and Gynecology | Admitting: Obstetrics and Gynecology

## 2024-09-09 ENCOUNTER — Other Ambulatory Visit: Payer: Self-pay

## 2024-09-09 ENCOUNTER — Encounter (HOSPITAL_COMMUNITY): Payer: Self-pay | Admitting: Obstetrics and Gynecology

## 2024-09-09 DIAGNOSIS — O26893 Other specified pregnancy related conditions, third trimester: Secondary | ICD-10-CM | POA: Diagnosis not present

## 2024-09-09 DIAGNOSIS — Z87891 Personal history of nicotine dependence: Secondary | ICD-10-CM

## 2024-09-09 DIAGNOSIS — O9902 Anemia complicating childbirth: Secondary | ICD-10-CM | POA: Diagnosis present

## 2024-09-09 DIAGNOSIS — K219 Gastro-esophageal reflux disease without esophagitis: Secondary | ICD-10-CM | POA: Diagnosis not present

## 2024-09-09 DIAGNOSIS — O42913 Preterm premature rupture of membranes, unspecified as to length of time between rupture and onset of labor, third trimester: Principal | ICD-10-CM | POA: Diagnosis present

## 2024-09-09 DIAGNOSIS — Z903 Acquired absence of stomach [part of]: Secondary | ICD-10-CM

## 2024-09-09 DIAGNOSIS — O3663X0 Maternal care for excessive fetal growth, third trimester, not applicable or unspecified: Secondary | ICD-10-CM | POA: Diagnosis not present

## 2024-09-09 DIAGNOSIS — O4290 Premature rupture of membranes, unspecified as to length of time between rupture and onset of labor, unspecified weeks of gestation: Principal | ICD-10-CM | POA: Diagnosis present

## 2024-09-09 DIAGNOSIS — Z880 Allergy status to penicillin: Secondary | ICD-10-CM

## 2024-09-09 DIAGNOSIS — O09293 Supervision of pregnancy with other poor reproductive or obstetric history, third trimester: Secondary | ICD-10-CM

## 2024-09-09 DIAGNOSIS — Z3A36 36 weeks gestation of pregnancy: Secondary | ICD-10-CM | POA: Diagnosis not present

## 2024-09-09 DIAGNOSIS — O99214 Obesity complicating childbirth: Secondary | ICD-10-CM | POA: Diagnosis present

## 2024-09-09 DIAGNOSIS — Z3480 Encounter for supervision of other normal pregnancy, unspecified trimester: Secondary | ICD-10-CM

## 2024-09-09 DIAGNOSIS — Z8614 Personal history of Methicillin resistant Staphylococcus aureus infection: Secondary | ICD-10-CM | POA: Diagnosis not present

## 2024-09-09 DIAGNOSIS — Z8759 Personal history of other complications of pregnancy, childbirth and the puerperium: Secondary | ICD-10-CM

## 2024-09-09 DIAGNOSIS — O134 Gestational [pregnancy-induced] hypertension without significant proteinuria, complicating childbirth: Secondary | ICD-10-CM | POA: Diagnosis present

## 2024-09-09 DIAGNOSIS — Z8249 Family history of ischemic heart disease and other diseases of the circulatory system: Secondary | ICD-10-CM | POA: Diagnosis not present

## 2024-09-09 DIAGNOSIS — O139 Gestational [pregnancy-induced] hypertension without significant proteinuria, unspecified trimester: Secondary | ICD-10-CM | POA: Diagnosis not present

## 2024-09-09 DIAGNOSIS — Z6791 Unspecified blood type, Rh negative: Secondary | ICD-10-CM | POA: Diagnosis not present

## 2024-09-09 DIAGNOSIS — O9962 Diseases of the digestive system complicating childbirth: Secondary | ICD-10-CM | POA: Diagnosis present

## 2024-09-09 DIAGNOSIS — Z7982 Long term (current) use of aspirin: Secondary | ICD-10-CM | POA: Diagnosis not present

## 2024-09-09 LAB — CBC
HCT: 30.9 % — ABNORMAL LOW (ref 36.0–46.0)
Hemoglobin: 10 g/dL — ABNORMAL LOW (ref 12.0–15.0)
MCH: 25.1 pg — ABNORMAL LOW (ref 26.0–34.0)
MCHC: 32.4 g/dL (ref 30.0–36.0)
MCV: 77.6 fL — ABNORMAL LOW (ref 80.0–100.0)
Platelets: 192 K/uL (ref 150–400)
RBC: 3.98 MIL/uL (ref 3.87–5.11)
RDW: 12.5 % (ref 11.5–15.5)
WBC: 6.4 K/uL (ref 4.0–10.5)
nRBC: 0 % (ref 0.0–0.2)

## 2024-09-09 LAB — TYPE AND SCREEN
ABO/RH(D): A NEG
Antibody Screen: POSITIVE

## 2024-09-09 LAB — POCT FERN TEST: POCT Fern Test: POSITIVE

## 2024-09-09 MED ORDER — OXYCODONE-ACETAMINOPHEN 5-325 MG PO TABS: 2.0000 | ORAL_TABLET | ORAL | Status: DC | PRN

## 2024-09-09 MED ORDER — OXYTOCIN-SODIUM CHLORIDE 30-0.9 UT/500ML-% IV SOLN
2.5000 [IU]/h | INTRAVENOUS | Status: DC
  Filled 2024-09-09: qty 500

## 2024-09-09 MED ORDER — OXYCODONE-ACETAMINOPHEN 5-325 MG PO TABS: 1.0000 | ORAL_TABLET | ORAL | Status: DC | PRN

## 2024-09-09 MED ORDER — CEFAZOLIN SODIUM-DEXTROSE 2-4 GM/100ML-% IV SOLN
2.0000 g | Freq: Once | INTRAVENOUS | Status: AC
  Administered 2024-09-09: 2 g via INTRAVENOUS
  Filled 2024-09-09: qty 100

## 2024-09-09 MED ORDER — LACTATED RINGERS IV SOLN: INTRAVENOUS | Status: DC

## 2024-09-09 MED ORDER — ONDANSETRON HCL 4 MG/2ML IJ SOLN
4.0000 mg | Freq: Four times a day (QID) | INTRAMUSCULAR | Status: DC | PRN
  Administered 2024-09-09 – 2024-09-10 (×2): 4 mg via INTRAVENOUS
  Filled 2024-09-09 (×2): qty 2

## 2024-09-09 MED ORDER — OXYTOCIN BOLUS FROM INFUSION
333.0000 mL | Freq: Once | INTRAVENOUS | Status: AC
  Administered 2024-09-10: 333 mL via INTRAVENOUS

## 2024-09-09 MED ORDER — ACETAMINOPHEN 325 MG PO TABS
650.0000 mg | ORAL_TABLET | ORAL | Status: DC | PRN
  Administered 2024-09-09: 650 mg via ORAL
  Filled 2024-09-09: qty 2

## 2024-09-09 MED ORDER — FENTANYL CITRATE (PF) 100 MCG/2ML IJ SOLN
100.0000 ug | INTRAMUSCULAR | Status: DC | PRN
  Administered 2024-09-09 – 2024-09-10 (×2): 100 ug via INTRAVENOUS
  Filled 2024-09-09 (×2): qty 2

## 2024-09-09 MED ORDER — SOD CITRATE-CITRIC ACID 500-334 MG/5ML PO SOLN: 30.0000 mL | ORAL | Status: DC | PRN

## 2024-09-09 MED ORDER — LIDOCAINE HCL (PF) 1 % IJ SOLN: 30.0000 mL | INTRAMUSCULAR | Status: DC | PRN

## 2024-09-09 MED ORDER — CEFAZOLIN SODIUM-DEXTROSE 1-4 GM/50ML-% IV SOLN
1.0000 g | Freq: Three times a day (TID) | INTRAVENOUS | Status: DC
  Administered 2024-09-10 (×2): 1 g via INTRAVENOUS
  Filled 2024-09-09 (×2): qty 50

## 2024-09-09 MED ORDER — LACTATED RINGERS IV SOLN: 500.0000 mL | INTRAVENOUS | Status: DC | PRN
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
## 2024-09-09 NOTE — MAU Note (Signed)
 MAU Labor Triage Note:  .Tara Blake is a 28 y.o. at [redacted]w[redacted]d here in MAU reporting:  Contractions every: 5 minutes Onset of ctx: 1730 Pain Score: 5  Pain Location: Abdomen  ROM: gush of fluid at 1830 Vaginal Bleeding: none Last SVE: 5 cm yesterday  Labor Pain Management Plan: Undecided  GBS: Unknown  Fetal Movement: Reports positive FM FHT: Fetal Heart Rate Mode: External Baseline Rate (A): 160 bpm  Vitals:   09/09/24 1941  BP: 115/82  Pulse: (!) 102  Resp: 18  Temp: 98.3 F (36.8 C)  SpO2: 100%      Lab orders placed from triage: MAU Labor Eval OB Office: Faculty

## 2024-09-10 ENCOUNTER — Inpatient Hospital Stay (HOSPITAL_COMMUNITY): Admitting: Anesthesiology

## 2024-09-10 ENCOUNTER — Encounter (HOSPITAL_COMMUNITY): Payer: Self-pay | Admitting: Obstetrics and Gynecology

## 2024-09-10 DIAGNOSIS — O9952 Diseases of the respiratory system complicating childbirth: Secondary | ICD-10-CM | POA: Diagnosis not present

## 2024-09-10 DIAGNOSIS — Z3A36 36 weeks gestation of pregnancy: Secondary | ICD-10-CM | POA: Diagnosis not present

## 2024-09-10 DIAGNOSIS — O139 Gestational [pregnancy-induced] hypertension without significant proteinuria, unspecified trimester: Secondary | ICD-10-CM | POA: Diagnosis not present

## 2024-09-10 DIAGNOSIS — J45909 Unspecified asthma, uncomplicated: Secondary | ICD-10-CM | POA: Diagnosis not present

## 2024-09-10 LAB — COMPREHENSIVE METABOLIC PANEL WITH GFR
ALT: 9 U/L (ref 0–44)
AST: 20 U/L (ref 15–41)
Albumin: 2.4 g/dL — ABNORMAL LOW (ref 3.5–5.0)
Alkaline Phosphatase: 108 U/L (ref 38–126)
Anion gap: 10 (ref 5–15)
BUN: <5 mg/dL — ABNORMAL LOW (ref 6–20)
CO2: 20 mmol/L — ABNORMAL LOW (ref 22–32)
Calcium: 8.6 mg/dL — ABNORMAL LOW (ref 8.9–10.3)
Chloride: 108 mmol/L (ref 98–111)
Creatinine, Ser: 0.5 mg/dL (ref 0.44–1.00)
GFR, Estimated: >60 mL/min (ref >60)
Glucose, Bld: 117 mg/dL — ABNORMAL HIGH (ref 70–99)
Potassium: 3.2 mmol/L — ABNORMAL LOW (ref 3.5–5.1)
Sodium: 138 mmol/L (ref 135–145)
Total Bilirubin: 0.2 mg/dL (ref 0.0–1.2)
Total Protein: 5.5 g/dL — ABNORMAL LOW (ref 6.5–8.1)

## 2024-09-10 LAB — CERVICOVAGINAL ANCILLARY ONLY
Chlamydia: NEGATIVE
Comment: NEGATIVE
Comment: NORMAL
Neisseria Gonorrhea: NEGATIVE

## 2024-09-10 LAB — RPR: RPR Ser Ql: NONREACTIVE

## 2024-09-10 MED ORDER — EPHEDRINE 5 MG/ML INJ: 10.0000 mg | INTRAVENOUS | Status: DC | PRN

## 2024-09-10 MED ORDER — ONDANSETRON HCL 4 MG PO TABS: 4.0000 mg | ORAL_TABLET | ORAL | Status: DC | PRN

## 2024-09-10 MED ORDER — METHYLERGONOVINE MALEATE 0.2 MG PO TABS: 0.2000 mg | ORAL_TABLET | ORAL | Status: DC | PRN

## 2024-09-10 MED ORDER — DIBUCAINE (PERIANAL) 1 % EX OINT: 1.0000 | TOPICAL_OINTMENT | CUTANEOUS | Status: DC | PRN

## 2024-09-10 MED ORDER — LIDOCAINE HCL (PF) 1 % IJ SOLN
INTRAMUSCULAR | Status: DC | PRN
Start: 2024-09-10 — End: 2024-09-10
  Administered 2024-09-10 (×2): 5 mL via EPIDURAL

## 2024-09-10 MED ORDER — FERROUS SULFATE 325 (65 FE) MG PO TABS
325.0000 mg | ORAL_TABLET | Freq: Two times a day (BID) | ORAL | Status: DC
  Administered 2024-09-11 – 2024-09-12 (×3): 325 mg via ORAL
  Filled 2024-09-10 (×3): qty 1

## 2024-09-10 MED ORDER — TETANUS-DIPHTH-ACELL PERTUSSIS 5-2-15.5 LF-MCG/0.5 IM SUSP: 0.5000 mL | Freq: Once | INTRAMUSCULAR | Status: DC

## 2024-09-10 MED ORDER — COCONUT OIL OIL: 1.0000 | TOPICAL_OIL | Status: DC | PRN

## 2024-09-10 MED ORDER — METHYLERGONOVINE MALEATE 0.2 MG/ML IJ SOLN
INTRAMUSCULAR | Status: AC
  Administered 2024-09-10: 0.2 mg
  Filled 2024-09-10: qty 1

## 2024-09-10 MED ORDER — FENTANYL-BUPIVACAINE-NACL 0.5-0.125-0.9 MG/250ML-% EP SOLN
12.0000 mL/h | EPIDURAL | Status: DC | PRN
  Administered 2024-09-10: 12 mL/h via EPIDURAL
  Filled 2024-09-10: qty 250

## 2024-09-10 MED ORDER — PHENYLEPHRINE 80 MCG/ML (10ML) SYRINGE FOR IV PUSH (FOR BLOOD PRESSURE SUPPORT): 80.0000 ug | PREFILLED_SYRINGE | INTRAVENOUS | Status: DC | PRN

## 2024-09-10 MED ORDER — ACETAMINOPHEN-CAFFEINE 500-65 MG PO TABS
2.0000 | ORAL_TABLET | Freq: Once | ORAL | Status: AC
  Administered 2024-09-10: 2 via ORAL
  Filled 2024-09-10: qty 2

## 2024-09-10 MED ORDER — PRENATAL MULTIVITAMIN CH
1.0000 | ORAL_TABLET | Freq: Every day | ORAL | Status: DC
  Administered 2024-09-11 – 2024-09-12 (×2): 1 via ORAL
  Filled 2024-09-10 (×2): qty 1

## 2024-09-10 MED ORDER — TERBUTALINE SULFATE 1 MG/ML IJ SOLN: 0.2500 mg | Freq: Once | INTRAMUSCULAR | Status: DC | PRN

## 2024-09-10 MED ORDER — MEASLES, MUMPS & RUBELLA VAC ~~LOC~~ SUSR: 0.5000 mL | Freq: Once | SUBCUTANEOUS | Status: DC

## 2024-09-10 MED ORDER — SIMETHICONE 80 MG PO CHEW: 80.0000 mg | CHEWABLE_TABLET | ORAL | Status: DC | PRN

## 2024-09-10 MED ORDER — DIPHENHYDRAMINE HCL 25 MG PO CAPS: 25.0000 mg | ORAL_CAPSULE | Freq: Four times a day (QID) | ORAL | Status: DC | PRN

## 2024-09-10 MED ORDER — EPHEDRINE 5 MG/ML INJ
10.0000 mg | INTRAVENOUS | Status: DC | PRN
Start: 2024-09-10 — End: 2024-09-10

## 2024-09-10 MED ORDER — ONDANSETRON HCL 4 MG/2ML IJ SOLN: 4.0000 mg | INTRAMUSCULAR | Status: DC | PRN

## 2024-09-10 MED ORDER — FUROSEMIDE 20 MG PO TABS
20.0000 mg | ORAL_TABLET | Freq: Every day | ORAL | Status: DC
  Administered 2024-09-11 – 2024-09-12 (×2): 20 mg via ORAL
  Filled 2024-09-10 (×2): qty 1

## 2024-09-10 MED ORDER — MAGNESIUM HYDROXIDE 400 MG/5ML PO SUSP: 30.0000 mL | ORAL | Status: DC | PRN

## 2024-09-10 MED ORDER — LACTATED RINGERS IV SOLN
500.0000 mL | Freq: Once | INTRAVENOUS | Status: AC
  Administered 2024-09-10: 500 mL via INTRAVENOUS

## 2024-09-10 MED ORDER — IBUPROFEN 600 MG PO TABS
600.0000 mg | ORAL_TABLET | Freq: Four times a day (QID) | ORAL | Status: DC
  Administered 2024-09-10 – 2024-09-12 (×8): 600 mg via ORAL
  Filled 2024-09-10 (×8): qty 1

## 2024-09-10 MED ORDER — POTASSIUM CHLORIDE CRYS ER 20 MEQ PO TBCR
20.0000 meq | EXTENDED_RELEASE_TABLET | Freq: Every day | ORAL | Status: DC
  Administered 2024-09-11 – 2024-09-12 (×2): 20 meq via ORAL
  Filled 2024-09-10 (×2): qty 1

## 2024-09-10 MED ORDER — TRANEXAMIC ACID-NACL 1000-0.7 MG/100ML-% IV SOLN
INTRAVENOUS | Status: AC
  Administered 2024-09-10: 1000 mg
  Filled 2024-09-10: qty 100

## 2024-09-10 MED ORDER — OXYTOCIN-SODIUM CHLORIDE 30-0.9 UT/500ML-% IV SOLN
1.0000 m[IU]/min | INTRAVENOUS | Status: DC
  Administered 2024-09-10: 2 m[IU]/min via INTRAVENOUS
  Filled 2024-09-10: qty 500

## 2024-09-10 MED ORDER — NIFEDIPINE ER OSMOTIC RELEASE 30 MG PO TB24
30.0000 mg | ORAL_TABLET | Freq: Every day | ORAL | Status: DC
  Administered 2024-09-10 – 2024-09-11 (×2): 30 mg via ORAL
  Filled 2024-09-10 (×2): qty 1

## 2024-09-10 MED ORDER — DIPHENHYDRAMINE HCL 50 MG/ML IJ SOLN: 12.5000 mg | INTRAMUSCULAR | Status: DC | PRN

## 2024-09-10 MED ORDER — WITCH HAZEL-GLYCERIN EX PADS: 1.0000 | MEDICATED_PAD | CUTANEOUS | Status: DC | PRN

## 2024-09-10 MED ORDER — BENZOCAINE-MENTHOL 20-0.5 % EX AERO
1.0000 | INHALATION_SPRAY | CUTANEOUS | Status: DC | PRN
  Administered 2024-09-10 – 2024-09-11 (×2): 1 via TOPICAL
  Filled 2024-09-10 (×2): qty 56

## 2024-09-10 MED ORDER — OXYCODONE HCL 5 MG PO TABS: 10.0000 mg | ORAL_TABLET | ORAL | Status: DC | PRN

## 2024-09-10 MED ORDER — TRANEXAMIC ACID-NACL 1000-0.7 MG/100ML-% IV SOLN: 1000.0000 mg | INTRAVENOUS | Status: DC

## 2024-09-10 MED ORDER — METHYLERGONOVINE MALEATE 0.2 MG/ML IJ SOLN: 0.2000 mg | Freq: Once | INTRAMUSCULAR | Status: DC

## 2024-09-10 MED ORDER — OXYCODONE HCL 5 MG PO TABS: 5.0000 mg | ORAL_TABLET | ORAL | Status: DC | PRN

## 2024-09-10 MED ORDER — ACETAMINOPHEN 325 MG PO TABS
650.0000 mg | ORAL_TABLET | ORAL | Status: DC | PRN
  Administered 2024-09-10 – 2024-09-11 (×2): 650 mg via ORAL
  Filled 2024-09-10 (×2): qty 2

## 2024-09-10 MED ORDER — METHYLERGONOVINE MALEATE 0.2 MG/ML IJ SOLN: 0.2000 mg | INTRAMUSCULAR | Status: DC | PRN

## 2024-09-10 NOTE — Anesthesia Procedure Notes (Signed)
 Epidural Patient location during procedure: OB Start time: 09/10/2024 7:09 AM End time: 09/10/2024 7:18 AM  Staffing Anesthesiologist: Jerrye Sharper, MD Performed: anesthesiologist   Preanesthetic Checklist Completed: patient identified, IV checked, site marked, risks and benefits discussed, surgical consent, monitors and equipment checked, pre-op evaluation and timeout performed  Epidural Patient position: sitting Prep: DuraPrep and site prepped and draped Patient monitoring: continuous pulse ox and blood pressure Approach: midline Location: L4-L5 Injection technique: LOR air  Needle:  Needle type: Tuohy  Needle gauge: 17 G Needle length: 9 cm and 9 Needle insertion depth: 6 cm Catheter type: closed end flexible Catheter size: 19 Gauge Catheter at skin depth: 11 cm Test dose: negative and Other  Assessment Events: blood not aspirated, no cerebrospinal fluid, injection not painful, no injection resistance, no paresthesia and negative IV test  Additional Notes Patient identified. Risks and benefits discussed including failed block, incomplete  Pain control, post dural puncture headache, nerve damage, paralysis, blood pressure Changes, nausea, vomiting, reactions to medications-both toxic and allergic and post Partum back pain. All questions were answered. Patient expressed understanding and wished to proceed. Sterile technique was used throughout procedure. Epidural site was Dressed with sterile barrier dressing. No paresthesias, signs of intravascular injection Or signs of intrathecal spread were encountered.  Patient was more comfortable after the epidural was dosed. Please see RN's note for documentation of vital signs and FHR which are stable.

## 2024-09-10 NOTE — Progress Notes (Signed)
 Labor Progress Note CYNAI SKEENS is a 28 y.o. [phone] at [redacted]w[redacted]d presented for SROM.  S:  Patient beginning to feel a lot more pressure. We had a discussion on different pain control options, and she elected to get an epidural. Her nurse did a cervical check before it was placed and her cervix has softened significantly.   O:  BP (!) 121/90   Pulse 89   Temp 97.9 F (36.6 C) (Oral)   Resp 17   Ht 5' 4 (1.626 m)   Wt 94.3 kg   LMP 12/28/2023 (Exact Date)   SpO2 100%   BMI 35.70 kg/m  EFM: 140/moderate variability/+ accels, - decels  CVE: Dilation: 6 Effacement (%): 90 Station: -2 Presentation: Vertex Exam by:: Alan Sandifer, RN   A&P: 28 y.o. [phone] [redacted]w[redacted]d who presented for SROM.   #Labor: Progressing well. Cervix softened significantly from prior exam. #Pain: Epidural placed.  #FWB: CAT I #GBS unknown, ancef  prophylaxis  Lavanda FORBES Aline, MD 7:29 AM

## 2024-09-10 NOTE — Progress Notes (Signed)
 Labor Progress Note Tara Blake is a 28 y.o. [phone] at [redacted]w[redacted]d presented for SROM   S:  Comfortable with epidural.  O:  BP 115/84   Pulse 83   Temp 98.4 F (36.9 C) (Oral)   Resp 16   Ht 5' 4 (1.626 m)   Wt 94.3 kg   LMP 12/28/2023 (Exact Date)   SpO2 99%   BMI 35.70 kg/m   EFM: baseline 135 bpm/ moderate variability/ 15x15 accels/ no decels  Toco/IUPC: 2-3 mins apart SVE: Dilation: 8.5 Effacement (%): 90 Station: -1 Presentation: Vertex Exam by:: Deneen Lye CNM Pitocin : 16 mu/min  A/P: 28 y.o. G4P3003 [redacted]w[redacted]d  1. Labor: Forebag broken at 1451. Proven to 4600 grams w/out difficulty. Feels like this baby is smaller.  2. FWB:  Cat 1 3. Pain: epidural 4. GBS: negative   Anticipate SVB.  [name] H [name], Student-MidWife 3:13 PM

## 2024-09-10 NOTE — Progress Notes (Signed)
 Labor Progress Note Tara Blake is a 28 y.o. [phone] at [redacted]w[redacted]d presented for SROM.Comfort  S:  Comfortable with epidural  O:  BP 122/83   Pulse 82   Temp 98.4 F (36.9 C) (Oral)   Resp 18   Ht 5' 4 (1.626 m)   Wt 94.3 kg   LMP 12/28/2023 (Exact Date)   SpO2 99%   BMI 35.70 kg/m   EFM: baseline 125 bpm/ moderate variability/ 15x15 accels/ no decels  Toco/IUPC: 2-3 mins  SVE: Dilation: 6.5 Effacement (%): 80 Station: -2 Presentation: Vertex Exam by:: Joandry Slagter CNM Pitocin : 12 mu/min  A/P: 28 y.o. G4P3003 [redacted]w[redacted]d  1. Labor: Cervical exam performed. Continue to titrate pitocin  as indicated. 2. FWB: Cat 1 3. Pain: epidural 4. GBS unknown: Ancef    Anticipate SVB.  Tara Blake, Student-MidWife 9:26 AM

## 2024-09-10 NOTE — Progress Notes (Signed)
 Labor Progress Note Tara Blake is a 28 y.o. [phone] at [redacted]w[redacted]d presented for SROM.  S:  Feeling much more uncomfortable and feels like she is being stretched. O:  BP 127/83   Pulse 81   Temp 97.9 F (36.6 C) (Oral)   Resp 17   Ht 5' 4 (1.626 m)   Wt 94.3 kg   LMP 12/28/2023 (Exact Date)   SpO2 100%   BMI 35.70 kg/m  EFM: 130/moderate variability/+accels, - decels  CVE: Dilation: 5 Effacement (%): 50 Station: -2 Presentation: Vertex Exam by:: [place], MD   A&P: 28 y.o. [id number] [redacted]w[redacted]d admitted for SROM.   #Labor: Came to check cervix as patient was feeling a lot of pain, it remains unchanged.  #Pain: Patient wanting to wait a little longer before getting an epidural.  #FWB: CAT I #GBS unknown, ancef    Tara Blake [place], MD 2:20 AM

## 2024-09-10 NOTE — Progress Notes (Signed)
 Tara Blake is a 28 y.o. [phone] at [redacted]w[redacted]d.  Subjective: Comfortable with epidural per RN.   Objective: BP 107/76   Pulse 78   Temp 98.4 F (36.9 C) (Oral)   Resp 16   Ht 5' 4 (1.626 m)   Wt 94.3 kg   LMP 12/28/2023 (Exact Date)   SpO2 99%   BMI 35.70 kg/m    FHT:  FHR: 130 bpm, variability: mod,  accelerations:  15x15,  decelerations:  none UC:   Q 2-4 minutes, strong Dilation: 7.5 Effacement (%): 90 Station: -2 Presentation: Vertex Exam by:: [employer] RN  Labs: NA  Assessment / Plan: [redacted]w[redacted]d week IUP ROM x Hours: 19 Minutes: 6 Labor: Active progressing slowly but adequately, possibly due to inadequate contractions. Consider increasing pitocin  or IUPC if not changing at next check. Proven to 4600 grams w/out difficulty. Feels like this baby is smaller.  Fetal Wellbeing:  Category I Pain Control:  Epidural. Anticipated MOD:  SVD  Julieanna Geraci , CNM 09/10/2024 1:36 PM

## 2024-09-10 NOTE — Progress Notes (Addendum)
 Labor Progress Note Tara Blake is a 28 y.o. [phone] at [redacted]w[redacted]d presented for PROM. S:  Patient doing well, feeling more increased pressure and continues to feel water leaking.   O:  BP 117/81   Pulse 82   Temp 98.1 F (36.7 C) (Oral)   Resp 17   Ht 5' 4 (1.626 m)   Wt 94.3 kg   LMP 12/28/2023 (Exact Date)   SpO2 100%   BMI 35.70 kg/m  EFM: 130/moderate variability/accels  CVE: Dilation: 5 Effacement (%): 50 Station: -2 Presentation: Vertex Exam by:: [place], MD   A&P: 28 y.o. [id number] [redacted]w[redacted]d for PROM.  #Labor: No cervical change on exam. Starting on low dose pitocin .  #Pain: currently controlled, will want epidural #FWB: CAT I #GBS unknown, ancef  prophylaxis  Tara Blake [place], MD 12:31 AM

## 2024-09-10 NOTE — Discharge Summary (Signed)
 Postpartum Discharge Summary     Patient Name: Tara Blake DOB: [birth year] MRN: [id number]  Date of admission: 09/09/2024 Delivery date:09/10/2024 Delivering provider: CLAUDENE, [place]  Date of discharge: 09/12/2024  Admitting diagnosis: Amniotic fluid leaking [O42.90] Intrauterine pregnancy: [redacted]w[redacted]d     Secondary diagnosis:  Principal Problem:   Amniotic fluid leaking Active Problems:   Rh negative status during pregnancy   History of sleeve gastrectomy   Supervision of other normal pregnancy, antepartum   History of gestational hypertension   Hx of macrosomia in infant in prior pregnancy, currently pregnant, third trimester   Vaginal delivery   Gestational hypertension  Additional problems:     Discharge diagnosis: Preterm Pregnancy Delivered                                              Post partum procedures:rhogam Augmentation: Pitocin  and AROM forebag Complications: None  Hospital course: Onset of Labor With Vaginal Delivery      28 y.o. yo [id number] at [redacted]w[redacted]d was admitted in Active Labor on 09/09/2024. Labor course was complicated by nothing.  Membrane Rupture Time/Date: 6:30 PM,09/09/2024  Delivery Method:Vaginal, Spontaneous Operative Delivery:N/A Episiotomy: None Lacerations:  None Patient had a postpartum course complicated by elevated Bps, she was started on Nifed 30, lasix, and K. Also given rhogam for Rh neg status.  She is ambulating, tolerating a regular diet, passing flatus, and urinating well. Patient is discharged home in stable condition on 09/12/24.  Newborn Data: Birth date:09/10/2024 Birth time:3:52 PM Gender:Female Living status:Living Apgars:9 ,9  Weight:3420 g  Magnesium  Sulfate received: No BMZ received: No Rhophylac :Yes MMR:N/A T-DaP:Given prenatally Flu: No RSV Vaccine received: No Transfusion:No  Immunizations received: Immunization History  Administered Date(s) Administered    sv, Bivalent, Protein Subunit Rsvpref,pf [place])  09/08/2024   Influenza, Seasonal, Injecte, Preservative Fre 09/08/2024   MMR 03/15/2019   Tdap 12/26/2018, 07/14/2024    Physical exam  Vitals:   09/11/24 0617 09/11/24 1443 09/11/24 2049 09/12/24 0621  BP: 116/86 123/86 118/89 119/84  Pulse: 72 81 80 83  Resp: 18 18 20 20   Temp: 98 F (36.7 C) 98.3 F (36.8 C) 98 F (36.7 C) 97.6 F (36.4 C)  TempSrc: Oral Oral Oral Oral  SpO2: 100% 99% 98% 100%  Weight:      Height:       General: alert, cooperative, and no distress Lochia: appropriate Uterine Fundus: firm Incision: N/A DVT Evaluation: No evidence of DVT seen on physical exam. No significant calf/ankle edema. Labs: Lab Results  Component Value Date   WBC 6.6 09/11/2024   HGB 8.7 (L) 09/11/2024   HCT 27.3 (L) 09/11/2024   MCV 78.7 (L) 09/11/2024   PLT 154 09/11/2024      Latest Ref Rng & Units 09/10/2024    8:06 PM  CMP  Glucose 70 - 99 mg/dL 882   BUN 6 - 20 mg/dL <5   Creatinine 9.55 - 1.00 mg/dL 9.49   Sodium 864 - 854 mmol/L 138   Potassium 3.5 - 5.1 mmol/L 3.2   Chloride 98 - 111 mmol/L 108   CO2 22 - 32 mmol/L 20   Calcium 8.9 - 10.3 mg/dL 8.6   Total Protein 6.5 - 8.1 g/dL 5.5   Total Bilirubin 0.0 - 1.2 mg/dL 0.2   Alkaline Phos 38 - 126 U/L 108   AST 15 - 41 U/L 20  ALT 0 - 44 U/L 9    Edinburgh Score:    09/11/2024   11:57 AM  Edinburgh Postnatal Depression Scale Screening Tool  I have been able to laugh and see the funny side of things. 0  I have looked forward with enjoyment to things. 0  I have blamed myself unnecessarily when things went wrong. 0  I have been anxious or worried for no good reason. 0  I have felt scared or panicky for no good reason. 0  Things have been getting on top of me. 0  I have been so unhappy that I have had difficulty sleeping. 0  I have felt sad or miserable. 0  I have been so unhappy that I have been crying. 0  The thought of harming myself has occurred to me. 0  Edinburgh Postnatal Depression Scale Total  0   Edinburgh Postnatal Depression Scale Total: 0   After visit meds:  Allergies as of 09/12/2024       Reactions   Amoxicillin Rash   Did it involve swelling of the face/tongue/throat, SOB, or low BP? Unknown Did it involve sudden or severe rash/hives, skin peeling, or any reaction on the inside of your mouth or nose? Unknown Did you need to seek medical attention at a hospital or doctor's office? Unknown When did it last happen? Infant reaction      If all above answers are "NO", may proceed with cephalosporin use.        Medication List     STOP taking these medications    aspirin  EC 81 MG tablet   metoCLOPramide 10 MG tablet Commonly known as: REGLAN       TAKE these medications    acetaminophen  325 MG tablet Commonly known as: TYLENOL  Take 650 mg by mouth every 6 (six) hours as needed for moderate pain (pain score 4-6) or headache.   Blood Pressure Kit Devi 1 Device by Does not apply route once a week.   Butalbital -APAP-Caffeine  50-325-40 MG capsule Take 1-2 capsules by mouth every 6 (six) hours as needed for headache.   cyclobenzaprine  10 MG tablet Commonly known as: FLEXERIL  Take 1 tablet (10 mg total) by mouth every 8 (eight) hours as needed for muscle spasms (or headache).   ferrous sulfate 325 (65 FE) MG tablet Take 1 tablet (325 mg total) by mouth every other day.   furosemide 20 MG tablet Commonly known as: LASIX Take 1 tablet (20 mg total) by mouth daily for 3 days. Start taking on: September 13, 2024   NIFEdipine 30 MG 24 hr tablet Commonly known as: ADALAT CC Take 1 tablet (30 mg total) by mouth daily.   potassium chloride SA 20 MEQ tablet Commonly known as: KLOR-CON M Take 1 tablet (20 mEq total) by mouth daily for 3 days. Start taking on: September 13, 2024   Prenatal 28-0.8 MG Tabs Take 1 tablet by mouth daily.         Discharge home in stable condition Infant Feeding: Breast Infant Disposition:home with mother Discharge  instruction: per After Visit Summary and Postpartum booklet. Activity: Advance as tolerated. Pelvic rest for 6 weeks.  Diet: iron rich diet Future Appointments: Future Appointments  Date Time Provider Department Center  10/07/2024  9:15 AM Delores Nidia CROME, FNP [facility] None   Follow up Visit:   Please schedule this patient for a In person postpartum visit in 4 weeks with the following provider: Any provider. Additional Postpartum F/U:BP check 2-3 days  High risk  pregnancy complicated by: HTN Delivery mode:  Vaginal, Spontaneous Anticipated Birth Control:  Condoms   09/12/2024 Donnice CHRISTELLA Carolus, MD

## 2024-09-10 NOTE — Anesthesia Preprocedure Evaluation (Signed)
 Anesthesia Evaluation  Patient identified by MRN, date of birth, ID band Patient awake    Reviewed: Allergy & Precautions, NPO status , Patient's Chart, lab work & pertinent test results  Airway Mallampati: II  TM Distance: >3 FB     Dental no notable dental hx. (+) Teeth Intact   Pulmonary asthma , former smoker   Pulmonary exam normal        Cardiovascular negative cardio ROS Normal cardiovascular exam Rhythm:Regular Rate:Normal     Neuro/Psych  Headaches PSYCHIATRIC DISORDERS  Depression       GI/Hepatic Neg liver ROS,GERD  ,,  Endo/Other  Obesity   Renal/GU negative Renal ROS  negative genitourinary   Musculoskeletal negative musculoskeletal ROS (+)    Abdominal  (+) + obese  Peds  Hematology  (+) Blood dyscrasia, anemia   Anesthesia Other Findings   Reproductive/Obstetrics (+) Pregnancy                              Anesthesia Physical Anesthesia Plan  ASA: 2  Anesthesia Plan: Epidural   Post-op Pain Management: Minimal or no pain anticipated   Induction: Intravenous  PONV Risk Score and Plan: Treatment may vary due to age or medical condition  Airway Management Planned: Natural Airway  Additional Equipment:   Intra-op Plan:   Post-operative Plan:   Informed Consent: I have reviewed the patients History and Physical, chart, labs and discussed the procedure including the risks, benefits and alternatives for the proposed anesthesia with the patient or authorized representative who has indicated his/her understanding and acceptance.       Plan Discussed with: Anesthesiologist  Anesthesia Plan Comments:         Anesthesia Quick Evaluation

## 2024-09-10 NOTE — Lactation Note (Signed)
 This note was copied from a baby's chart. Lactation Consultation Note  Patient Name: Tara Blake Unijb'd Date: 09/10/2024 Age:28 hours Reason for consult: Initial assessment;Late-preterm 34-36.6wks  P4. Mom stated the baby hasn't had anything to eat since 5 ish. Mom stated the baby hasn't wanted to eat or BF. LC asked mom If I could try mom stated sure. LC done a lot to stimulate baby. She would open her eye some but then go back to sleep. Baby is tired. LC only got baby to take 5 ml of formula. Mom stated the baby has been spitting bubbles out of her mouth a lot. Encouraged mom when she isn't tired do a lot of STS w/the baby.  LPI information given and explained.  LPI behavior, feeding habits, STS, importance of I&O reviewed. LC discussed w/mom pumping. Mom in agreement.  Mom shown how to use DEBP & how to disassemble, clean, & reassemble parts. Mom pumping when left room. Praised mom. Mom encouraged to feed baby 8-12 times/24 hours and with feeding cues. Encouraged to wake baby every 3 hrs to get baby to feed if hasn't cued to feed before then. Encouraged mom to call for [facility] for latch assistance when baby is ready to feed.  Maternal Data Has patient been taught Hand Expression?: Yes Does the patient have breastfeeding experience prior to this delivery?: Yes How long did the patient breastfeed?: 3 months  Feeding Mother's Current Feeding Choice: Breast Milk and Formula Nipple Type: Slow - flow  LATCH Score Latch: Too sleepy or reluctant, no latch achieved, no sucking elicited.     Type of Nipple: Everted at rest and after stimulation  Comfort (Breast/Nipple): Soft / non-tender         Lactation Tools Discussed/Used Tools: Pump;Flanges Flange Size: 18;21 Breast pump type: Double-Electric Breast Pump Pump Education: Setup, frequency, and cleaning;Milk Storage Reason for Pumping: LPI Pumping frequency: Q 3hr  Interventions Interventions: Breast feeding basics  reviewed;Hand express;DEBP;Education;Pace feeding;LC Services brochure;Infant Driven Feeding Algorithm education;LPT handout/interventions  Discharge Pump: DEBP (starts with a L)  Consult Status Consult Status: Follow-up Date: 09/11/24 Follow-up type: In-patient    [name] G 09/10/2024, 10:59 PM

## 2024-09-11 LAB — CBC
HCT: 27.3 % — ABNORMAL LOW (ref 36.0–46.0)
Hemoglobin: 8.7 g/dL — ABNORMAL LOW (ref 12.0–15.0)
MCH: 25.1 pg — ABNORMAL LOW (ref 26.0–34.0)
MCHC: 31.9 g/dL (ref 30.0–36.0)
MCV: 78.7 fL — ABNORMAL LOW (ref 80.0–100.0)
Platelets: 154 K/uL (ref 150–400)
RBC: 3.47 MIL/uL — ABNORMAL LOW (ref 3.87–5.11)
RDW: 12.7 % (ref 11.5–15.5)
WBC: 6.6 K/uL (ref 4.0–10.5)
nRBC: 0 % (ref 0.0–0.2)

## 2024-09-11 LAB — BIRTH TISSUE RECOVERY COLLECTION (PLACENTA DONATION)

## 2024-09-11 MED ORDER — RHO D IMMUNE GLOBULIN 1500 UNIT/2ML IJ SOSY
300.0000 ug | PREFILLED_SYRINGE | Freq: Once | INTRAMUSCULAR | Status: AC
  Administered 2024-09-11: 300 ug via INTRAVENOUS
  Filled 2024-09-11: qty 2

## 2024-09-11 NOTE — Progress Notes (Signed)
 POSTPARTUM PROGRESS NOTE  Post partum Day 1 Subjective:  Tara Blake is a 28 y.o. H5E6895 [redacted]w[redacted]d s/p nsvd.  No acute events overnight.  Pt denies problems with ambulating, voiding or po intake.  She denies nausea or vomiting.  Pain is well controlled.  She has had flatus. She has not had bowel movement.  Lochia Small.   Objective: Blood pressure 116/86, pulse 72, temperature 98 F (36.7 C), temperature source Oral, resp. rate 18, height 5' 4 (1.626 m), weight 94.3 kg, last menstrual period 12/28/2023, SpO2 100%, unknown if currently breastfeeding.  Physical Exam:  General: alert, cooperative and no distress Lochia:normal flow Chest: CTAB Heart: RRR no m/r/g Abdomen: +BS, soft, nontender,  Uterine Fundus: firm,  DVT Evaluation: No calf swelling or tenderness Extremities: no edema  Recent Labs    09/09/24 2030 09/11/24 0559  HGB 10.0* 8.7*  HCT 30.9* 27.3*    Assessment/Plan:  ASSESSMENT: Tara Blake is a 28 y.o. H5E6895 [redacted]w[redacted]d s/p nsvd, doing well. Mild ghtn, bps wnl today on procardia and lasix, will start iron for hgb of 8.7, lochia appropriate, breastfeeding, planning on condoms, will need rhogam prior to d/c.   Plan for discharge tomorrow   LOS: 2 days   Devaughn KATHEE Ban 09/11/2024, 9:51 AM

## 2024-09-11 NOTE — Anesthesia Postprocedure Evaluation (Signed)
 Anesthesia Post Note  Patient: Tara Blake  Procedure(s) Performed: AN AD HOC LABOR EPIDURAL     Patient location during evaluation: Mother Baby Anesthesia Type: Epidural Level of consciousness: awake and alert Pain management: pain level controlled Vital Signs Assessment: post-procedure vital signs reviewed and stable Respiratory status: spontaneous breathing, nonlabored ventilation and respiratory function stable Cardiovascular status: stable Postop Assessment: no headache, no backache, epidural receding, no apparent nausea or vomiting, patient able to bend at knees, able to ambulate and adequate PO intake Anesthetic complications: no   No notable events documented.  Last Vitals:  Vitals:   09/11/24 0209 09/11/24 0617  BP: 110/66 116/86  Pulse: 68 72  Resp: 18 18  Temp: 36.8 C 36.7 C  SpO2: 98% 100%    Last Pain:  Vitals:   09/11/24 0825  TempSrc:   PainSc: 7    Pain Goal:                Epidural/Spinal Function Cutaneous sensation: Normal sensation (09/11/24 0825)  Thomos Janetta Lout

## 2024-09-11 NOTE — Social Work (Signed)
 MOB was referred for history of depression/anxiety.  * Referral screened out by Clinical Social Worker because none of the following criteria appear to apply:  ~ History of anxiety/depression during this pregnancy, or of post-partum depression following prior delivery.  ~ Diagnosis of anxiety and/or depression within last 3 years OR * MOB's symptoms currently being treated with medication and/or therapy.  Per chart review MOB diagnosis was back in 2017, per OB records no MH concerns noted during this pregnancy. Edinburgh=0  Please contact the Clinical Social Worker if needs arise, or by MOB request.  Eliazar Gave, LCSWA Clinical Social Worker [phone]

## 2024-09-11 NOTE — Patient Instructions (Signed)

## 2024-09-12 ENCOUNTER — Other Ambulatory Visit (HOSPITAL_COMMUNITY): Payer: Self-pay

## 2024-09-12 LAB — RH IG WORKUP (INCLUDES ABO/RH)
Fetal Screen: NEGATIVE
Gestational Age(Wks): 36.5
Unit division: 0

## 2024-09-12 LAB — CULTURE, BETA STREP (GROUP B ONLY): Strep Gp B Culture: NEGATIVE

## 2024-09-12 MED ORDER — POTASSIUM CHLORIDE CRYS ER 20 MEQ PO TBCR
20.0000 meq | EXTENDED_RELEASE_TABLET | Freq: Every day | ORAL | 0 refills | Status: AC
  Filled 2024-09-12: qty 3, 3d supply, fill #0

## 2024-09-12 MED ORDER — FERROUS SULFATE 325 (65 FE) MG PO TABS
325.0000 mg | ORAL_TABLET | ORAL | 0 refills | Status: AC
  Filled 2024-09-12: qty 30, 60d supply, fill #0

## 2024-09-12 MED ORDER — NIFEDIPINE ER 30 MG PO TB24
30.0000 mg | ORAL_TABLET | Freq: Every day | ORAL | 2 refills | Status: AC
  Filled 2024-09-12: qty 30, 30d supply, fill #0

## 2024-09-12 MED ORDER — FUROSEMIDE 20 MG PO TABS
20.0000 mg | ORAL_TABLET | Freq: Every day | ORAL | 0 refills | Status: AC
  Filled 2024-09-12: qty 3, 3d supply, fill #0

## 2024-09-12 NOTE — Lactation Note (Addendum)
 This note was copied from a baby's chart. Lactation Consultation Note  Patient Name: Tara Blake Date: 09/12/2024 Age:28 hours Reason for consult: Follow-up assessment;Late-preterm 34-36.6wks  P4, Baby [redacted]w[redacted]d.  Weight loss 7.46%. 2.05% in the last 24 hours. 10 voids/4 stools in the last 24 hours.  Observed baby at the end of feeding in cradle hold on the R breast.  Baby was latched deep on the breast with flanged lips.  Suggest ending each breastfeeding session after 15 - 20 min and give supplement after.     Maternal Data Has patient been taught Hand Expression?: Yes  Feeding Mother's Current Feeding Choice: Breast Milk and Formula  LATCH Score Latch: Grasps breast easily, tongue down, lips flanged, rhythmical sucking.  Audible Swallowing: A few with stimulation  Type of Nipple: Everted at rest and after stimulation  Comfort (Breast/Nipple): Soft / non-tender  Hold (Positioning): No assistance needed to correctly position infant at breast.  LATCH Score: 9   Interventions Interventions: Breast feeding basics reviewed  Discharge Discharge Education: Engorgement and breast care;Warning signs for feeding baby Pump: DEBP [place])  Consult Status Consult Status: Complete Date: 09/12/24 Follow-up type: In-patient    Shannon Dines Boschen  RN IBCLC 09/12/2024, 10:18 AM

## 2024-09-12 NOTE — Lactation Note (Signed)
 This note was copied from a baby's chart. Lactation Consultation Note  Patient Name: Tara Blake Unijb'd Date: 09/12/2024 Age:28 hours  Attempted to see mom but mom was sleeping. Dad was awake holding baby. LC waved at dad.   Maternal Data    Feeding Nipple Type: Dr. Jonna Eleuterio Galli  [facility] Score                    Lactation Tools Discussed/Used    Interventions    Discharge    Consult Status      [name] 09/12/2024, 4:14 AM

## 2024-09-12 NOTE — Lactation Note (Signed)
 This note was copied from a baby's chart. Lactation Consultation Note  Patient Name: Tara Blake Date: 09/12/2024 Age:28 hours Reason for consult: Follow-up assessment;Late-preterm 34-36.6wks  P4, Baby born at [redacted]w[redacted]d.  Baby is breastfeeding occasionally but primarily formula feeding.  Recent breastfeeding session was 26 min and formula feeding 30 ml of 22 kcal formula. Suggest calling with next feeding for LC to view latch.  Discussed the possible need to limit feeding sessions if baby becomes sleepy to 30 min.  Parents aware. Encouraged post pumping to stimulate mother's supply especially if baby does not latch.  Pump for 15 min q 3 hours.  Reviewed engorgement care and monitoring voids/stools. Feed on demand with cues at least q 3 hours.   Maternal Data Has patient been taught Hand Expression?: Yes  Feeding Mother's Current Feeding Choice: Breast Milk and Formula Lactation Tools Discussed/Used  DEBP  Interventions Interventions: Breast feeding basics reviewed;Education;DEBP  Discharge Discharge Education: Engorgement and breast care;Warning signs for feeding baby Pump: DEBP [place])  Consult Status Consult Status: Follow-up Date: 09/12/24 Follow-up type: In-patient    Shannon Dines [facility] 09/12/2024, 8:56 AM

## 2024-09-15 ENCOUNTER — Encounter: Admitting: Obstetrics and Gynecology

## 2024-09-17 ENCOUNTER — Encounter: Payer: Self-pay | Admitting: Advanced Practice Midwife

## 2024-09-21 ENCOUNTER — Ambulatory Visit

## 2024-09-21 ENCOUNTER — Other Ambulatory Visit

## 2024-09-22 ENCOUNTER — Telehealth (HOSPITAL_COMMUNITY): Payer: Self-pay | Admitting: *Deleted

## 2024-09-22 NOTE — Telephone Encounter (Signed)
 Attempted hospital discharge follow-up call. Left message for patient to return RN call with any questions or concerns. Allean IVAR Carton, RN, 09/22/24, [phone]

## 2024-10-07 ENCOUNTER — Ambulatory Visit: Admitting: Obstetrics and Gynecology

## 2024-10-07 ENCOUNTER — Encounter: Payer: Self-pay | Admitting: Obstetrics and Gynecology

## 2024-10-07 DIAGNOSIS — Z8759 Personal history of other complications of pregnancy, childbirth and the puerperium: Secondary | ICD-10-CM

## 2024-10-07 DIAGNOSIS — Z903 Acquired absence of stomach [part of]: Secondary | ICD-10-CM

## 2024-10-07 NOTE — Progress Notes (Signed)
 Post Partum Visit Note  Tara Blake is a 28 y.o. [phone] female who presents for a postpartum visit. She is 3 weeks postpartum following a normal spontaneous vaginal delivery.  I have fully reviewed the prenatal and intrapartum course. The delivery was at [redacted]w[redacted]d gestational weeks.  Anesthesia: epidural. Postpartum course has been good. Baby is doing well. Baby is feeding by breast. Bleeding staining only. Bowel function is abnormal: constipation. Bladder function is normal. Patient is sexually active. Contraception method is condoms. Postpartum depression screening: negative.  Reports occasionally breaking out in hives, has not changed diet or medication   The pregnancy intention screening data noted above was reviewed. Potential methods of contraception were discussed. The patient elected to proceed with condoms   Edinburgh Postnatal Depression Scale - 10/07/24 0939       Edinburgh Postnatal Depression Scale:  In the Past 7 Days   I have been able to laugh and see the funny side of things. 0    I have looked forward with enjoyment to things. 0    I have blamed myself unnecessarily when things went wrong. 0    I have been anxious or worried for no good reason. 0    I have felt scared or panicky for no good reason. 0    Things have been getting on top of me. 1    I have been so unhappy that I have had difficulty sleeping. 0    I have felt sad or miserable. 0    I have been so unhappy that I have been crying. 0    The thought of harming myself has occurred to me. 0    Edinburgh Postnatal Depression Scale Total 1          Health Maintenance Due  Topic Date Due   Hepatitis B Vaccines 19-59 Average Risk (1 of 3 - 19+ 3-dose series) Never done   HPV VACCINES (1 - 3-dose SCDM series) Never done   COVID-19 Vaccine (1 - 2025-26 season) Never done    The following portions of the patient's history were reviewed and updated as appropriate: allergies, current medications, past family  history, past medical history, past social history, past surgical history, and problem list.  Review of Systems Pertinent items are noted in HPI.  Objective:  BP 122/85   Pulse 84   Ht 5' 4 (1.626 m)   Wt 190 lb (86.2 kg)   LMP 12/28/2023 (Exact Date)   Breastfeeding Yes   BMI 32.61 kg/m    General:  alert and cooperative   Breasts:  not indicated  Lungs: Normal effort     Abdomen: Non distended      GU exam:  not indicated       Assessment:  1. Postpartum exam (Primary)   2. History of gestational hypertension Still taking procardia , discussed returning for BP check, stop meds two days prior   3. History of sleeve gastrectomy    Plan:   Essential components of care per ACOG recommendations:  1.  Mood and well being: Patient with negative depression screening today. Reviewed local resources for support.  - Patient tobacco use? No.    2. Infant care and feeding:  -Patient currently breastmilk feeding? Yes. Reviewed importance of draining breast regularly to support lactation.  -Social determinants of health (SDOH) reviewed in EPIC. No concerns  3. Sexuality, contraception and birth spacing - Patient does not want a pregnancy in the next year.  - Reviewed reproductive life  planning. Reviewed contraceptive methods based on pt preferences and effectiveness.  Patient desired Female Condom and planning vasectomy today.   - Discussed birth spacing of 18 months  4. Sleep and fatigue -Encouraged family/partner/community support of 4 hrs of uninterrupted sleep to help with mood and fatigue  5. Physical Recovery  - Discussed patients delivery and complications. She describes her labor as good. - Patient had a vaginal delivery. Patient had a right periurethal lac, hemostasis  laceration. Perineal healing reviewed. Patient expressed understanding - Patient has urinary incontinence? No. - Patient is safe to resume physical and sexual activity  6.  Health Maintenance - HM  due items addressed Yes - Last pap smear  Diagnosis  Date Value Ref Range Status  05/23/2023   Final   - Negative for intraepithelial lesion or malignancy (NILM)   Pap smear not done at today's visit.  -Breast Cancer screening indicated? No.   7. Chronic Disease/Pregnancy Condition follow up: Hypertension  - PCP follow up  Nidia Daring, FNP [facility], [facility]

## 2024-10-14 ENCOUNTER — Ambulatory Visit

## 2024-10-18 ENCOUNTER — Encounter: Payer: Self-pay | Admitting: Advanced Practice Midwife

## 2024-10-18 DIAGNOSIS — N61 Mastitis without abscess: Secondary | ICD-10-CM | POA: Diagnosis not present

## 2024-10-20 ENCOUNTER — Telehealth: Payer: Self-pay | Admitting: Advanced Practice Midwife

## 2024-10-20 DIAGNOSIS — O9122 Nonpurulent mastitis associated with the puerperium: Secondary | ICD-10-CM

## 2024-10-20 MED ORDER — CEFADROXIL 500 MG PO CAPS: 500.0000 mg | ORAL_CAPSULE | Freq: Two times a day (BID) | ORAL | 0 refills | Status: AC

## 2024-10-20 NOTE — Telephone Encounter (Signed)
 I called patient to respond to her question about antibiotics and pumping/dumping breastmilk.  First, I let patient know that she can feed the baby and not pump and dump on the Clarithromycin she is taking. Second, since patient is now symptom free, and would like to stop the antibiotic, I think that is reasonable and treat with ice to the breast, increased PO fluids, and frequent breastfeeding/pumping.  I will send an Rx for Duricef to take BID x 7 days for patient to start if symptoms return.

## 2024-11-22 ENCOUNTER — Encounter: Payer: Self-pay | Admitting: Advanced Practice Midwife
